# Patient Record
Sex: Male | Born: 1955 | Race: White | Hispanic: No | Marital: Single | Smoking: Former smoker
Health system: Southern US, Community
[De-identification: ages and names within clinical notes are randomized; demographics above are authoritative.]

## PROBLEM LIST (undated history)

## (undated) DIAGNOSIS — M2550 Pain in unspecified joint: Secondary | ICD-10-CM

## (undated) DIAGNOSIS — E785 Hyperlipidemia, unspecified: Secondary | ICD-10-CM

## (undated) DIAGNOSIS — E039 Hypothyroidism, unspecified: Secondary | ICD-10-CM

## (undated) DIAGNOSIS — T7840XA Allergy, unspecified, initial encounter: Secondary | ICD-10-CM

## (undated) DIAGNOSIS — R0602 Shortness of breath: Secondary | ICD-10-CM

## (undated) DIAGNOSIS — I4891 Unspecified atrial fibrillation: Secondary | ICD-10-CM

## (undated) DIAGNOSIS — E119 Type 2 diabetes mellitus without complications: Secondary | ICD-10-CM

## (undated) DIAGNOSIS — I10 Essential (primary) hypertension: Secondary | ICD-10-CM

## (undated) HISTORY — DX: Hypothyroidism, unspecified: E03.9

## (undated) HISTORY — DX: Pain in unspecified joint: M25.50

## (undated) HISTORY — DX: Shortness of breath: R06.02

## (undated) HISTORY — DX: Allergy, unspecified, initial encounter: T78.40XA

## (undated) HISTORY — DX: Hyperlipidemia, unspecified: E78.5

---

## 2008-11-02 ENCOUNTER — Emergency Department (HOSPITAL_COMMUNITY): Admission: EM | Admit: 2008-11-02 | Discharge: 2008-11-02 | Payer: Self-pay | Admitting: Family Medicine

## 2013-02-23 ENCOUNTER — Ambulatory Visit (INDEPENDENT_AMBULATORY_CARE_PROVIDER_SITE_OTHER): Payer: Self-pay | Admitting: Physician Assistant

## 2013-02-23 VITALS — BP 148/92 | HR 83 | Temp 98.3°F | Resp 17 | Ht 73.0 in | Wt 356.0 lb

## 2013-02-23 DIAGNOSIS — E119 Type 2 diabetes mellitus without complications: Secondary | ICD-10-CM

## 2013-02-23 DIAGNOSIS — R7309 Other abnormal glucose: Secondary | ICD-10-CM

## 2013-02-23 DIAGNOSIS — J019 Acute sinusitis, unspecified: Secondary | ICD-10-CM

## 2013-02-23 DIAGNOSIS — R7302 Impaired glucose tolerance (oral): Secondary | ICD-10-CM

## 2013-02-23 LAB — POCT CBC
Granulocyte percent: 80.6 %G — AB (ref 37–80)
MCH, POC: 29.7 pg (ref 27–31.2)
MCHC: 32.7 g/dL (ref 31.8–35.4)
MPV: 8.5 fL (ref 0–99.8)
POC Granulocyte: 9.3 — AB (ref 2–6.9)
POC LYMPH PERCENT: 14.3 %L (ref 10–50)
Platelet Count, POC: 243 10*3/uL (ref 142–424)
RBC: 5.19 M/uL (ref 4.69–6.13)

## 2013-02-23 MED ORDER — LISINOPRIL 5 MG PO TABS: 5.0000 mg | ORAL_TABLET | Freq: Every day | ORAL | Status: DC

## 2013-02-23 MED ORDER — METFORMIN HCL 500 MG PO TABS: 500.0000 mg | ORAL_TABLET | Freq: Two times a day (BID) | ORAL | Status: DC

## 2013-02-23 MED ORDER — AZITHROMYCIN 250 MG PO TABS: ORAL_TABLET | ORAL | Status: DC

## 2013-02-23 MED ORDER — IPRATROPIUM BROMIDE 0.06 % NA SOLN: 2.0000 | Freq: Three times a day (TID) | NASAL | Status: DC

## 2013-02-23 NOTE — Progress Notes (Signed)
Patient ID: Darius Ingram MRN: [id number], DOB: [birth year], 57 y.o. Date of Encounter: 02/23/2013, 1:05 PM  Primary Physician: No primary provider on file.  Chief Complaint:  Chief Complaint  Patient presents with  . Sinus Problem    sinus drainage and congestion started yesterday     HPI: 57 y.o. male presents with a 1 day history of nasal congestion, post nasal drip, sore throat, sinus pressure, and mild cough. Afebrile. No chills. Nasal congestion thick and green/yellow. Sinus pressure is the worst symptom along the maxillary sinuses. Cough is productive secondary to post nasal drip and not associated with time of day. No SOB or wheezing. Ears feel full, leading to sensation of muffled hearing. Has tried OTC cold preps without success. No GI complaints. Appetite normal. Typical presentation for patient. No chest pain or chest tightness.   No recent antibiotics, recent travels, or sick contacts.   No leg trauma, sedentary periods, h/o cancer, or tobacco use.  Past Medical History  Diagnosis Date  . Allergy      Home Meds: Prior to Admission medications   Medication Sig Start Date End Date Taking? Authorizing Provider  Pseudoephedrine-Acetaminophen (SINUS PO) Take by mouth.   Yes Historical Provider, MD    Allergies:  Allergies  Allergen Reactions  . Penicillins Rash    History   Social History  . Marital Status: Single    Spouse Name: N/A    Number of Children: N/A  . Years of Education: N/A   Occupational History  . Not on file.   Social History Main Topics  . Smoking status: Never Smoker   . Smokeless tobacco: Not on file  . Alcohol Use: Yes  . Drug Use: Not on file  . Sexually Active: Not on file   Other Topics Concern  . Not on file   Social History Narrative  . No narrative on file     Review of Systems: Constitutional: Positive for fatigue. Negative for chills, fever, night sweats or weight changes HEENT: see above Cardiovascular:  negative for chest pain or palpitations Respiratory: Positive for cough. Negative for hemoptysis, wheezing, or shortness of breath Abdominal: negative for abdominal pain, nausea, vomiting or diarrhea Dermatological: negative for rash Neurologic: Positive for headache. Negative for dizziness or vertigo   Physical Exam: Blood pressure 148/92, pulse 83, temperature 98.3 F (36.8 C), temperature source Oral, resp. rate 17, height 6\' 1"  (1.854 m), weight 356 lb (161.481 kg), SpO2 93.00%., Body mass index is 46.98 kg/(m^2). Pulse ox rechecks at 98% on room air.  General: Well developed, well nourished, in no acute distress. Head: Normocephalic, atraumatic, eyes without discharge, sclera non-icteric, nares are congested. Bilateral auditory canals clear, TM's are without perforation, pearly grey with reflective cone of light bilaterally. Serous effusion bilaterally behind TM's. Palpation of the maxillary sinus relieves pressure. Oral cavity moist, dentition normal. Posterior pharynx with post nasal drip and mild erythema. No peritonsillar abscess or tonsillar exudate. Uvula midline.  Neck: Supple. No thyromegaly. Full ROM. No lymphadenopathy. Lungs: Clear bilaterally to auscultation without wheezes, rales, or rhonchi. Breathing is unlabored.  Heart: RRR with S1 S2. No murmurs, rubs, or gallops appreciated. Msk:  Strength and tone normal for age. Extremities: No clubbing or cyanosis. No edema. Neuro: Alert and oriented X 3. Moves all extremities spontaneously. CNII-XII grossly in tact. Psych:  Responds to questions appropriately with a normal affect.   Labs: Results for orders placed in visit on 02/23/13  POCT CBC      Result  Value Range   WBC 11.5 (*) 4.6 - 10.2 K/uL   Lymph, poc 1.6  0.6 - 3.4   POC LYMPH PERCENT 14.3  10 - 50 %L   MID (cbc) 0.6  0 - 0.9   POC MID % 5.1  0 - 12 %M   POC Granulocyte 9.3 (*) 2 - 6.9   Granulocyte percent 80.6 (*) 37 - 80 %G   RBC 5.19  4.69 - 6.13 M/uL    Hemoglobin 15.4  14.1 - 18.1 g/dL   HCT, POC 16.1  09.6 - 53.7 %   MCV 90.8  80 - 97 fL   MCH, POC 29.7  27 - 31.2 pg   MCHC 32.7  31.8 - 35.4 g/dL   RDW, POC 04.5     Platelet Count, POC 243  142 - 424 K/uL   MPV 8.5  0 - 99.8 fL  GLUCOSE, POCT (MANUAL RESULT ENTRY)      Result Value Range   POC Glucose 128 (*) 70 - 99 mg/dl  POCT GLYCOSYLATED HEMOGLOBIN (HGB A1C)      Result Value Range   Hemoglobin A1C 6.6       ASSESSMENT AND PLAN:  57 y.o. male with sinusitis and newly diagnosed diabetes mellitus 1) Sinusitis -Azithromycin 250 MG #6 2 po first day then 1 po next 4 days no RF -Prednisone 20 mg 2 po daily for 5 days #10 no RF, SED -Mucinex -Tylenol/Motrin prn -Rest/fluids -RTC precautions -RTC 3-5 days if no improvement  2) Diabetes mellitus -Metformin 500 mg 1 po bid #60 RF 2 -Lisinopril 5 mg 1 po daily #30 RF 2 -ASA 81 mg  -Plan to start statin at follow up  -Healthy diet and exercise   Signed, Eula Listen, PA-C 02/23/2013 1:05 PM

## 2013-02-24 ENCOUNTER — Telehealth: Payer: Self-pay

## 2013-02-24 NOTE — Telephone Encounter (Signed)
PATIENT STATES HE WAS IN YESTERDAY (02/23/13) TO SEE RYAN DUNN FOR A SINUS INFECTION. HE NEEDS TO GET A NOTE FOR HIS JOB. HE NEEDS IT TO SAY WHY HE WAS HERE AND THE DATES ARE TO BE OUT FROM WED. 02/23/13 UNTIL SUN. 02/27/13. HE WILL RETURN TO WORK ON MON. 02/28/2013. PLEASE CALL HIM WHEN IT IS READY TO BE PICKED UP. BEST PHONE [phone].  MBC

## 2013-02-25 NOTE — Telephone Encounter (Signed)
Note provided, Darius Ingram

## 2013-05-16 ENCOUNTER — Encounter: Payer: Self-pay | Admitting: Physician Assistant

## 2013-05-16 ENCOUNTER — Ambulatory Visit: Payer: Self-pay | Admitting: Physician Assistant

## 2013-05-16 VITALS — BP 140/90 | HR 75 | Temp 98.7°F | Resp 20 | Ht 72.0 in | Wt 362.2 lb

## 2013-05-16 DIAGNOSIS — J019 Acute sinusitis, unspecified: Secondary | ICD-10-CM

## 2013-05-16 DIAGNOSIS — E119 Type 2 diabetes mellitus without complications: Secondary | ICD-10-CM

## 2013-05-16 DIAGNOSIS — R0981 Nasal congestion: Secondary | ICD-10-CM

## 2013-05-16 DIAGNOSIS — J3489 Other specified disorders of nose and nasal sinuses: Secondary | ICD-10-CM

## 2013-05-16 LAB — COMPREHENSIVE METABOLIC PANEL
ALT: 24 U/L (ref 0–53)
AST: 21 U/L (ref 0–37)
Albumin: 4.1 g/dL (ref 3.5–5.2)
Alkaline Phosphatase: 83 U/L (ref 39–117)
BUN: 16 mg/dL (ref 6–23)
Calcium: 9.2 mg/dL (ref 8.4–10.5)
Chloride: 108 mEq/L (ref 96–112)
Glucose, Bld: 111 mg/dL — ABNORMAL HIGH (ref 70–99)
Potassium: 4 mEq/L (ref 3.5–5.3)
Sodium: 145 mEq/L (ref 135–145)
Total Bilirubin: 0.4 mg/dL (ref 0.3–1.2)
Total Protein: 7 g/dL (ref 6.0–8.3)

## 2013-05-16 LAB — LIPID PANEL
LDL Cholesterol: 126 mg/dL — ABNORMAL HIGH (ref 0–99)
Total CHOL/HDL Ratio: 4.1 Ratio
Triglycerides: 73 mg/dL (ref <150)
VLDL: 15 mg/dL (ref 0–40)

## 2013-05-16 MED ORDER — METFORMIN HCL 1000 MG PO TABS: 1000.0000 mg | ORAL_TABLET | Freq: Two times a day (BID) | ORAL | Status: DC

## 2013-05-16 MED ORDER — LISINOPRIL 5 MG PO TABS: 5.0000 mg | ORAL_TABLET | Freq: Every day | ORAL | Status: DC

## 2013-05-16 MED ORDER — IPRATROPIUM BROMIDE 0.06 % NA SOLN: 2.0000 | Freq: Three times a day (TID) | NASAL | Status: DC

## 2013-05-16 NOTE — Progress Notes (Signed)
Patient ID: Darius Ingram MRN: [id number], DOB: 1955-11-11, 57 y.o. Date of Encounter: 05/16/2013, 2:21 PM  Primary Physician: No primary provider on file.  Chief Complaint: Diabetes follow up  HPI: 57 y.o. male with history below presents for follow up of diabetes mellitus. Doing well. No issues or complaints. Currently taking metformin 500 mg bid, lisinopril 5 mg daily, and ASA 81 mg daily. He is hesitant to start more medications such as a statin at this time. He is slowly working on his diet, but chocolate ice cream is his weakness. He will sometimes have days where he cannot get full. Drinks about 4-5 twelve oz beers per week. No polydipsia, polyphagia, polyuria, or nocturia.   Long term nasal congestion. Atrovent nasal spray helps. Requests a refill. Changes of the season seem to flare this up for him.   Last A1C: 6.6 on 02/24/13   Past Medical History  Diagnosis Date  . Allergy      Home Meds: Prior to Admission medications   Medication Sig Start Date End Date Taking? Authorizing Provider  lisinopril (PRINIVIL,ZESTRIL) 5 MG tablet Take 1 tablet (5 mg total) by mouth daily. 02/23/13  Yes Ahlia Lemanski M Simisola Sandles, PA-C  metFORMIN (GLUCOPHAGE) 500 MG tablet Take 1 tablet (500 mg total) by mouth 2 (two) times daily with a meal. 02/23/13  Yes Sondra Barges, PA-C           Allergies:  Allergies  Allergen Reactions  . Penicillins Rash    History   Social History  . Marital Status: Single    Spouse Name: N/A    Number of Children: N/A  . Years of Education: N/A   Occupational History  . Not on file.   Social History Main Topics  . Smoking status: Never Smoker   . Smokeless tobacco: Not on file  . Alcohol Use: Yes  . Drug Use: Not on file  . Sexual Activity: Not on file   Other Topics Concern  . Not on file   Social History Narrative  . No narrative on file     Review of Systems: Constitutional: negative for chills, fever, night sweats, weight changes, or fatigue    HEENT: positive for nasal congestion. negative for vision changes, hearing loss, rhinorrhea, or epistaxis Cardiovascular: negative for chest pain, palpitations, diaphoresis, DOE, orthopnea, or edema Respiratory: negative for wheezing, shortness of breath, dyspnea, or cough Abdominal: negative for abdominal pain, nausea, vomiting, diarrhea, or constipation Dermatological: negative for rash, erythema, or wounds Neurologic: negative for headache   Physical Exam: Blood pressure 140/90, pulse 75, temperature 98.7 F (37.1 C), temperature source Oral, resp. rate 20, height 6' (1.829 m), weight 362 lb 3.2 oz (164.293 kg), SpO2 97.00%., Body mass index is 49.11 kg/(m^2). General: Well developed, well nourished, in no acute distress. Head: Normocephalic, atraumatic, eyes without discharge, sclera non-icteric, nares are congested. Bilateral auditory canals clear, TM's are without perforation, pearly grey and translucent with reflective cone of light bilaterally. Oral cavity moist, posterior pharynx without exudate, erythema, peritonsillar abscess, or post nasal drip.  Neck: Supple. No thyromegaly. Full ROM. No lymphadenopathy. Lungs: Clear bilaterally to auscultation without wheezes, rales, or rhonchi. Breathing is unlabored. Heart: RRR with S1 S2. No murmurs, rubs, or gallops appreciated. Msk:  Strength and tone normal for age. Extremities/Skin: Warm and dry. No clubbing or cyanosis. No edema. No rashes, wounds, or suspicious lesions. Monofilament exam unremarkable bilaterally.  Neuro: Alert and oriented X 3. Moves all extremities spontaneously. Gait is normal. CNII-XII grossly in  tact. Psych:  Responds to questions appropriately with a normal affect.   Labs: Results for orders placed in visit on 05/16/13  GLUCOSE, POCT (MANUAL RESULT ENTRY)      Result Value Range   POC Glucose 116 (*) 70 - 99 mg/dl  POCT GLYCOSYLATED HEMOGLOBIN (HGB A1C)      Result Value Range   Hemoglobin A1C 6.9      CMP  and lipid pending.   ASSESSMENT AND PLAN:  57 y.o. male with diabetes mellitus and nasal congestion.  1) Diabetes mellitus  -Increase metformin to 1000 mg bid #60 RF 5 -Lisinopril 5 mg 1 po daily #30 RF 5 -ASA 81 mg 1 po daily -Will consider adding statin based on cholesterol -Healthy diet and start walking routine -Cut out some of the ice cream -Recheck 3 months -Patient to get his influenza vaccine at a pharmacy secondary to no insurance -Declines PNA vaccine -Will have to slowly introduce CPE, DDS, and eye MD to patient. He has even mentioned introducing new ideas slow so not to scare him away.  2) Nasal congestion -Atrovent NS 0.06% 2 sprays each nare bid prn #1 RF 5 -Declines ENT referral   Signed, Eula Listen, PA-C [facility] [place], [place] [address] [phone] 05/16/2013 2:21 PM

## 2013-05-26 ENCOUNTER — Encounter (HOSPITAL_COMMUNITY): Payer: Self-pay | Admitting: Emergency Medicine

## 2013-05-26 ENCOUNTER — Emergency Department (HOSPITAL_COMMUNITY)
Admission: EM | Admit: 2013-05-26 | Discharge: 2013-05-26 | Disposition: A | Discharge status: Home / Self Care | Payer: No Typology Code available for payment source | Attending: Emergency Medicine | Admitting: Emergency Medicine

## 2013-05-26 DIAGNOSIS — Z79899 Other long term (current) drug therapy: Secondary | ICD-10-CM | POA: Insufficient documentation

## 2013-05-26 DIAGNOSIS — E119 Type 2 diabetes mellitus without complications: Secondary | ICD-10-CM | POA: Insufficient documentation

## 2013-05-26 DIAGNOSIS — Z880 Allergy status to penicillin: Secondary | ICD-10-CM | POA: Insufficient documentation

## 2013-05-26 DIAGNOSIS — S0990XA Unspecified injury of head, initial encounter: Secondary | ICD-10-CM | POA: Insufficient documentation

## 2013-05-26 DIAGNOSIS — F419 Anxiety disorder, unspecified: Secondary | ICD-10-CM

## 2013-05-26 DIAGNOSIS — I10 Essential (primary) hypertension: Secondary | ICD-10-CM | POA: Insufficient documentation

## 2013-05-26 DIAGNOSIS — Y9389 Activity, other specified: Secondary | ICD-10-CM | POA: Insufficient documentation

## 2013-05-26 DIAGNOSIS — F411 Generalized anxiety disorder: Secondary | ICD-10-CM | POA: Insufficient documentation

## 2013-05-26 DIAGNOSIS — Y92410 Unspecified street and highway as the place of occurrence of the external cause: Secondary | ICD-10-CM | POA: Insufficient documentation

## 2013-05-26 HISTORY — DX: Essential (primary) hypertension: I10

## 2013-05-26 HISTORY — DX: Type 2 diabetes mellitus without complications: E11.9

## 2013-05-26 MED ORDER — CYCLOBENZAPRINE HCL 10 MG PO TABS: 10.0000 mg | ORAL_TABLET | Freq: Two times a day (BID) | ORAL | Status: DC | PRN

## 2013-05-26 MED ORDER — IBUPROFEN 800 MG PO TABS: 800.0000 mg | ORAL_TABLET | Freq: Three times a day (TID) | ORAL | Status: DC

## 2013-05-26 MED ORDER — HYDROCODONE-ACETAMINOPHEN 5-325 MG PO TABS: 1.0000 | ORAL_TABLET | ORAL | Status: DC | PRN

## 2013-05-26 NOTE — ED Notes (Signed)
PA at bedside.
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
## 2013-05-28 NOTE — ED Provider Notes (Signed)
Medical screening examination/treatment/procedure(s) were performed by non-physician practitioner and as supervising physician I was immediately available for consultation/collaboration.  EKG Interpretation   None         Nori Poland T Lisanne Ponce, MD 05/28/13 0950 

## 2013-08-22 ENCOUNTER — Ambulatory Visit: Payer: Self-pay | Admitting: Physician Assistant

## 2017-04-28 ENCOUNTER — Ambulatory Visit (INDEPENDENT_AMBULATORY_CARE_PROVIDER_SITE_OTHER): Payer: PRIVATE HEALTH INSURANCE | Admitting: Urgent Care

## 2017-04-28 ENCOUNTER — Encounter: Payer: Self-pay | Admitting: Urgent Care

## 2017-04-28 VITALS — BP 200/100 | HR 94 | Temp 98.3°F | Resp 18 | Ht 72.0 in | Wt 354.2 lb

## 2017-04-28 DIAGNOSIS — R030 Elevated blood-pressure reading, without diagnosis of hypertension: Secondary | ICD-10-CM

## 2017-04-28 DIAGNOSIS — M25561 Pain in right knee: Secondary | ICD-10-CM

## 2017-04-28 DIAGNOSIS — I10 Essential (primary) hypertension: Secondary | ICD-10-CM

## 2017-04-28 DIAGNOSIS — E1165 Type 2 diabetes mellitus with hyperglycemia: Secondary | ICD-10-CM | POA: Diagnosis not present

## 2017-04-28 DIAGNOSIS — M222X1 Patellofemoral disorders, right knee: Secondary | ICD-10-CM | POA: Diagnosis not present

## 2017-04-28 LAB — POCT GLYCOSYLATED HEMOGLOBIN (HGB A1C): Hemoglobin A1C: 6.9

## 2017-04-28 LAB — EKG 12-LEAD

## 2017-04-28 MED ORDER — ASPIRIN 81 MG PO TABS
81.0000 mg | ORAL_TABLET | Freq: Every day | ORAL | 3 refills | Status: DC
Start: 2017-04-28 — End: 2018-04-16

## 2017-04-28 MED ORDER — METFORMIN HCL 500 MG PO TABS: 500.0000 mg | ORAL_TABLET | Freq: Two times a day (BID) | ORAL | 1 refills | Status: DC

## 2017-04-28 MED ORDER — LISINOPRIL-HYDROCHLOROTHIAZIDE 10-12.5 MG PO TABS: 1.0000 | ORAL_TABLET | Freq: Every day | ORAL | 1 refills | Status: DC

## 2017-04-28 NOTE — Patient Instructions (Addendum)
You may take 500mg  Tylenol with every 6 hours for pain and inflammation.    Patellofemoral Pain Syndrome Patellofemoral pain syndrome is a condition that involves a softening or breakdown of the tissue (cartilage) on the underside of your kneecap (patella). This causes pain in the front of the knee. The condition is also called runner's knee or chondromalacia patella. Patellofemoral pain syndrome is most common in young adults who are active in sports. Your knee is the largest joint in your body. The patella covers the front of your knee and is attached to muscles above and below your knee. The underside of the patella is covered with a smooth type of cartilage (synovium). The smooth surface helps the patella glide easily when you move your knee. Patellofemoral pain syndrome causes swelling in the joint linings and bone surfaces in your knee. What are the causes? Patellofemoral pain syndrome can be caused by:  Overuse.  Poor alignment of your knee joints.  Weak leg muscles.  A direct blow to your kneecap.  What increases the risk? You may be at risk for patellofemoral pain syndrome if you:  Do a lot of activities that can wear down your kneecap. These include: ? Running. ? Squatting. ? Climbing stairs.  Start a new physical activity or exercise program.  Wear shoes that do not fit well.  Do not have good leg strength.  Are overweight.  What are the signs or symptoms? Knee pain is the most common symptom of patellofemoral pain syndrome. This may feel like a dull, aching pain underneath your patella, in the front of your knee. There may be a popping or cracking sound when you move your knee. Pain may get worse with:  Exercise.  Climbing stairs.  Running.  Jumping.  Squatting.  Kneeling.  Sitting for a long time.  Moving or pushing on your patella.  How is this diagnosed? Your health care provider may be able to diagnose patellofemoral pain syndrome from your  symptoms and medical history. You may be asked about your recent physical activities and which ones cause knee pain. Your health care provider may do a physical exam with certain tests to confirm the diagnosis. These may include:  Moving your patella back and forth.  Checking your range of knee motion.  Having you squat or jump to see if you have pain.  Checking the strength of your leg muscles.  An MRI of the knee may also be done. How is this treated? Patellofemoral pain syndrome can usually be treated at home with rest, ice, compression, and elevation (RICE). Other treatments may include:  Nonsteroidal anti-inflammatory drugs (NSAIDs).  Physical therapy to stretch and strengthen your leg muscles.  Shoe inserts (orthotics) to take stress off your knee.  A knee brace or knee support.  Surgery to remove damaged cartilage or move the patella to a better position. The need for surgery is rare.  Follow these instructions at home:  Take medicines only as directed by your health care provider.  Rest your knee. ? When resting, keep your knee raised above the level of your heart. ? Avoid activities that cause knee pain.  Apply ice to the injured area: ? Put ice in a plastic bag. ? Place a towel between your skin and the bag. ? Leave the ice on for 20 minutes, 2-3 times a day.  Use splints, braces, knee supports, or walking aids as directed by your health care provider.  Perform stretching and strengthening exercises as directed by your health  care provider or physical therapist.  Keep all follow-up visits as directed by your health care provider. This is important. Contact a health care provider if:  Your symptoms get worse.  You are not improving with home care. This information is not intended to replace advice given to you by your health care provider. Make sure you discuss any questions you have with your health care provider. Document Released: 07/02/2009 Document  Revised: 12/20/2015 Document Reviewed: 10/03/2013 Elsevier Interactive Patient Education  2018 [employer].    Knee Pain, Adult Many things can cause knee pain. The pain often goes away on its own with time and rest. If the pain does not go away, tests may be done to find out what is causing the pain. Follow these instructions at home: Activity  Rest your knee.  Do not do things that cause pain.  Avoid activities where both feet leave the ground at the same time (high-impact activities). Examples are running, jumping rope, and doing jumping jacks. General instructions  Take medicines only as told by your doctor.  Raise (elevate) your knee when you are resting. Make sure your knee is higher than your heart.  Sleep with a pillow under your knee.  If told, put ice on the knee: ? Put ice in a plastic bag. ? Place a towel between your skin and the bag. ? Leave the ice on for 20 minutes, 2-3 times a day.  Ask your doctor if you should wear an elastic knee support.  Lose weight if you are overweight. Being overweight can make your knee hurt more.  Do not use any tobacco products. These include cigarettes, chewing tobacco, or electronic cigarettes. If you need help quitting, ask your doctor. Smoking may slow down healing. Contact a doctor if:  The pain does not stop.  The pain changes or gets worse.  You have a fever along with knee pain.  Your knee gives out or locks up.  Your knee swells, and becomes worse. Get help right away if:  Your knee feels warm.  You cannot move your knee.  You have very bad knee pain.  You have chest pain.  You have trouble breathing. Summary  Many things can cause knee pain. The pain often goes away on its own with time and rest.  Avoid activities that put stress on your knee. These include running and jumping rope.  Get help right away if you cannot move your knee, or if your knee feels warm, or if you have trouble breathing. This  information is not intended to replace advice given to you by your health care provider. Make sure you discuss any questions you have with your health care provider. Document Released: 10/10/2008 Document Revised: 07/08/2016 Document Reviewed: 07/08/2016 Elsevier Interactive Patient Education  2017 [employer].     IF you received an x-ray today, you will receive an invoice from [facility]. Please contact [facility] at [phone] with questions or concerns regarding your invoice.   IF you received labwork today, you will receive an invoice from [facility]. Please contact [facility] at [phone] with questions or concerns regarding your invoice.   Our billing staff will not be able to assist you with questions regarding bills from these companies.  You will be contacted with the lab results as soon as they are available. The fastest way to get your results is to activate your My Chart account. Instructions are located on the last page of this paperwork. If you have not heard from  [place] regarding the results in 2 weeks, please contact this office.

## 2017-04-28 NOTE — Progress Notes (Signed)
  MRN: [id number] DOB: [birth year]  Subjective:   Darius Ingram is a 61 y.o. male presenting for chief complaint of Knee Pain (has history of right knee pain)  DM - Reports that he ran out of insurance and could not afford his medications any longer. Patient denies blurred vision, polydipsia, chest pain, nausea, vomiting, abdominal pain, hematuria, polyuria, skin infections, numbness or tingling.   HTN - Same as above for not using medications. Denies ROS as above including dizziness, confusion, heart racing, dyspnea, orthopnea, lower leg swelling. Denies smoking cigarettes.  Knee pain - Reports 1 week history of intermittent achy knee pain. He walks to [employer] daily and gets groceries from the same area. Denies redness, swelling, bruising, trauma, falls.  Darius Ingram currently has no medications in their medication list. Also is allergic to penicillins.  Darius Ingram  has a past medical history of Allergy; Diabetes mellitus without complication ([place]); and Hypertension. Also  has no past surgical history on file.  Objective:   Vitals: BP (!) 200/100   Pulse 94   Temp 98.3 F (36.8 C) (Oral)   Resp 18   Ht 6' (1.829 m)   Wt (!) 354 lb 3.2 oz (160.7 kg)   SpO2 97%   BMI 48.04 kg/m   Pulse was 72 on recheck by PA-Everlene Cunning.  Physical Exam  Constitutional: He is oriented to person, place, and time. He appears well-developed and well-nourished.  Cardiovascular: Normal rate, regular rhythm and intact distal pulses.  Exam reveals no gallop and no friction rub.   No murmur heard. Pulmonary/Chest: No respiratory distress. He has no wheezes. He has no rales.  Abdominal: Soft. Bowel sounds are normal. He exhibits no distension and no mass. There is no tenderness. There is no guarding.  Neurological: He is alert and oriented to person, place, and time.   Results for orders placed or performed in visit on 04/28/17 (from the past 24 hour(s))  POCT glycosylated hemoglobin (Hb A1C)     Status: None   Collection Time: 04/28/17 10:36 AM  Result Value Ref Range   Hemoglobin A1C 6.9    ECG interpretation - sinus tachycardia, no previous ecg for comparison.  Assessment and Plan :   1. Elevated blood pressure reading 2. Essential hypertension - Patient is asymptomatic, tachycardia not noted on physical exam. Start lis-HCTZ. ER and return-to-clinic precautions discussed, patient verbalized understanding. Follow up in 1 week for recheck.  3. Uncontrolled type 2 diabetes mellitus with hyperglycemia (HCC) - Restart metformin. Lifestyle modifications. Labs pending.  4. Right knee pain, unspecified chronicity 5. Patellofemoral pain syndrome of right knee - Will start conservative management with APAP, rest, modification of activities.  6. Morbid obesity ([place]) - Lipid panel pending.  Jaynee Eagles, PA-C Primary Care at [facility] [phone] 04/28/2017  10:07 AM

## 2017-04-29 LAB — COMPREHENSIVE METABOLIC PANEL
ALK PHOS: 80 IU/L (ref 39–117)
ALT: 17 IU/L (ref 0–44)
AST: 23 IU/L (ref 0–40)
Albumin/Globulin Ratio: 1.2 (ref 1.2–2.2)
Albumin: 3.7 g/dL (ref 3.6–4.8)
BUN/Creatinine Ratio: 17 (ref 10–24)
BUN: 17 mg/dL (ref 8–27)
Bilirubin Total: 0.3 mg/dL (ref 0.0–1.2)
CO2: 30 mmol/L — AB (ref 20–29)
CREATININE: 1.03 mg/dL (ref 0.76–1.27)
Calcium: 8.7 mg/dL (ref 8.6–10.2)
Chloride: 104 mmol/L (ref 96–106)
GFR calc non Af Amer: 77 mL/min/{1.73_m2} (ref >59)
GFR, EST AFRICAN AMERICAN: 90 mL/min/{1.73_m2} (ref >59)
GLOBULIN, TOTAL: 3.2 g/dL (ref 1.5–4.5)
GLUCOSE: 243 mg/dL — AB (ref 65–99)
POTASSIUM: 3.9 mmol/L (ref 3.5–5.2)
SODIUM: 143 mmol/L (ref 134–144)
TOTAL PROTEIN: 6.9 g/dL (ref 6.0–8.5)

## 2017-04-29 LAB — LIPID PANEL
CHOLESTEROL TOTAL: 158 mg/dL (ref 100–199)
Chol/HDL Ratio: 4.3 ratio (ref 0.0–5.0)
HDL: 37 mg/dL — ABNORMAL LOW (ref >39)
LDL CALC: 104 mg/dL — AB (ref 0–99)
TRIGLYCERIDES: 87 mg/dL (ref 0–149)
VLDL CHOLESTEROL CAL: 17 mg/dL (ref 5–40)

## 2017-04-29 LAB — BRAIN NATRIURETIC PEPTIDE: BNP: 154.3 pg/mL — ABNORMAL HIGH (ref 0.0–100.0)

## 2017-05-05 ENCOUNTER — Encounter: Payer: Self-pay | Admitting: Urgent Care

## 2017-05-05 ENCOUNTER — Ambulatory Visit (INDEPENDENT_AMBULATORY_CARE_PROVIDER_SITE_OTHER): Payer: PRIVATE HEALTH INSURANCE

## 2017-05-05 ENCOUNTER — Ambulatory Visit (INDEPENDENT_AMBULATORY_CARE_PROVIDER_SITE_OTHER): Payer: PRIVATE HEALTH INSURANCE | Admitting: Urgent Care

## 2017-05-05 ENCOUNTER — Telehealth: Payer: Self-pay | Admitting: Urgent Care

## 2017-05-05 VITALS — BP 142/94 | HR 86 | Temp 98.8°F | Resp 17 | Ht 72.0 in | Wt 353.0 lb

## 2017-05-05 DIAGNOSIS — I10 Essential (primary) hypertension: Secondary | ICD-10-CM

## 2017-05-05 DIAGNOSIS — R030 Elevated blood-pressure reading, without diagnosis of hypertension: Secondary | ICD-10-CM

## 2017-05-05 DIAGNOSIS — M25561 Pain in right knee: Secondary | ICD-10-CM | POA: Diagnosis not present

## 2017-05-05 DIAGNOSIS — M222X1 Patellofemoral disorders, right knee: Secondary | ICD-10-CM | POA: Diagnosis not present

## 2017-05-05 DIAGNOSIS — R7989 Other specified abnormal findings of blood chemistry: Secondary | ICD-10-CM

## 2017-05-05 MED ORDER — TRAMADOL-ACETAMINOPHEN 37.5-325 MG PO TABS: 1.0000 | ORAL_TABLET | Freq: Four times a day (QID) | ORAL | 0 refills | Status: DC | PRN

## 2017-05-05 MED ORDER — ATORVASTATIN CALCIUM 40 MG PO TABS: 40.0000 mg | ORAL_TABLET | Freq: Every day | ORAL | 3 refills | Status: DC

## 2017-05-05 NOTE — Patient Instructions (Addendum)
Knee Pain, Adult Knee pain in adults is common. It can be caused by many things, including:  Arthritis.  A fluid-filled sac (cyst) or growth in your knee.  An infection in your knee.  An injury that will not heal.  Damage, swelling, or irritation of the tissues that support your knee.  Knee pain is usually not a sign of a serious problem. The pain may go away on its own with time and rest. If it does not, a health care provider may order tests to find the cause of the pain. These may include:  Imaging tests, such as an X-ray, MRI, or ultrasound.  Joint aspiration. In this test, fluid is removed from the knee.  Arthroscopy. In this test, a lighted tube is inserted into knee and an image is projected onto a TV screen.  A biopsy. In this test, a sample of tissue is removed from the body and studied under a microscope.  Follow these instructions at home: Pay attention to any changes in your symptoms. Take these actions to relieve your pain. Activity  Rest your knee.  Do not do things that cause pain or make pain worse.  Avoid high-impact activities or exercises, such as running, jumping rope, or doing jumping jacks. General instructions  Take over-the-counter and prescription medicines only as told by your health care provider.  Raise (elevate) your knee above the level of your heart when you are sitting or lying down.  Sleep with a pillow under your knee.  If directed, apply ice to the knee: ? Put ice in a plastic bag. ? Place a towel between your skin and the bag. ? Leave the ice on for 20 minutes, 2-3 times a day.  Ask your health care provider if you should wear an elastic knee support.  Lose weight if you are overweight. Extra weight can put pressure on your knee.  Do not use any products that contain nicotine or tobacco, such as cigarettes and e-cigarettes. Smoking may slow the healing of any bone and joint problems that you may have. If you need help quitting, ask  your health care provider. Contact a health care provider if:  Your knee pain continues, changes, or gets worse.  You have a fever along with knee pain.  Your knee buckles or locks up.  Your knee swells, and the swelling becomes worse. Get help right away if:  Your knee feels warm to the touch.  You cannot move your knee.  You have severe pain in your knee.  You have chest pain.  You have trouble breathing. Summary  Knee pain in adults is common. It can be caused by many things, including, arthritis, infection, cysts, or injury.  Knee pain is usually not a sign of a serious problem, but if it does not go away, a health care provider may perform tests to know the cause of the pain.  Pay attention to any changes in your symptoms. Relieve your pain with rest, medicines, light activity, and use of ice.  Get help if your pain continues or becomes very severe, or if your knee buckles or locks up, or if you have chest pain or trouble breathing. This information is not intended to replace advice given to you by your health care provider. Make sure you discuss any questions you have with your health care provider. Document Released: 05/11/2007 Document Revised: 07/04/2016 Document Reviewed: 07/04/2016 Elsevier Interactive Patient Education  2018 [employer].     Hypertension Hypertension, commonly called high  blood pressure, is when the force of blood pumping through the arteries is too strong. The arteries are the blood vessels that carry blood from the heart throughout the body. Hypertension forces the heart to work harder to pump blood and may cause arteries to become narrow or stiff. Having untreated or uncontrolled hypertension can cause heart attacks, strokes, kidney disease, and other problems. A blood pressure reading consists of a higher number over a lower number. Ideally, your blood pressure should be below 120/80. The first ("top") number is called the systolic pressure.  It is a measure of the pressure in your arteries as your heart beats. The second ("bottom") number is called the diastolic pressure. It is a measure of the pressure in your arteries as the heart relaxes. What are the causes? The cause of this condition is not known. What increases the risk? Some risk factors for high blood pressure are under your control. Others are not. Factors you can change  Smoking.  Having type 2 diabetes mellitus, high cholesterol, or both.  Not getting enough exercise or physical activity.  Being overweight.  Having too much fat, sugar, calories, or salt (sodium) in your diet.  Drinking too much alcohol. Factors that are difficult or impossible to change  Having chronic kidney disease.  Having a family history of high blood pressure.  Age. Risk increases with age.  Race. You may be at higher risk if you are African-American.  Gender. Men are at higher risk than women before age 1. After age 35, women are at higher risk than men.  Having obstructive sleep apnea.  Stress. What are the signs or symptoms? Extremely high blood pressure (hypertensive crisis) may cause:  Headache.  Anxiety.  Shortness of breath.  Nosebleed.  Nausea and vomiting.  Severe chest pain.  Jerky movements you cannot control (seizures).  How is this diagnosed? This condition is diagnosed by measuring your blood pressure while you are seated, with your arm resting on a surface. The cuff of the blood pressure monitor will be placed directly against the skin of your upper arm at the level of your heart. It should be measured at least twice using the same arm. Certain conditions can cause a difference in blood pressure between your right and left arms. Certain factors can cause blood pressure readings to be lower or higher than normal (elevated) for a short period of time:  When your blood pressure is higher when you are in a health care provider's office than when you are  at home, this is called white coat hypertension. Most people with this condition do not need medicines.  When your blood pressure is higher at home than when you are in a health care provider's office, this is called masked hypertension. Most people with this condition may need medicines to control blood pressure.  If you have a high blood pressure reading during one visit or you have normal blood pressure with other risk factors:  You may be asked to return on a different day to have your blood pressure checked again.  You may be asked to monitor your blood pressure at home for 1 week or longer.  If you are diagnosed with hypertension, you may have other blood or imaging tests to help your health care provider understand your overall risk for other conditions. How is this treated? This condition is treated by making healthy lifestyle changes, such as eating healthy foods, exercising more, and reducing your alcohol intake. Your health care provider may  prescribe medicine if lifestyle changes are not enough to get your blood pressure under control, and if:  Your systolic blood pressure is above 130.  Your diastolic blood pressure is above 80.  Your personal target blood pressure may vary depending on your medical conditions, your age, and other factors. Follow these instructions at home: Eating and drinking  Eat a diet that is high in fiber and potassium, and low in sodium, added sugar, and fat. An example eating plan is called the DASH (Dietary Approaches to Stop Hypertension) diet. To eat this way: ? Eat plenty of fresh fruits and vegetables. Try to fill half of your plate at each meal with fruits and vegetables. ? Eat whole grains, such as whole wheat pasta, brown rice, or whole grain bread. Fill about one quarter of your plate with whole grains. ? Eat or drink low-fat dairy products, such as skim milk or low-fat yogurt. ? Avoid fatty cuts of meat, processed or cured meats, and poultry  with skin. Fill about one quarter of your plate with lean proteins, such as fish, chicken without skin, beans, eggs, and tofu. ? Avoid premade and processed foods. These tend to be higher in sodium, added sugar, and fat.  Reduce your daily sodium intake. Most people with hypertension should eat less than 1,500 mg of sodium a day.  Limit alcohol intake to no more than 1 drink a day for nonpregnant women and 2 drinks a day for men. One drink equals 12 oz of beer, 5 oz of wine, or 1 oz of hard liquor. Lifestyle  Work with your health care provider to maintain a healthy body weight or to lose weight. Ask what an ideal weight is for you.  Get at least 30 minutes of exercise that causes your heart to beat faster (aerobic exercise) most days of the week. Activities may include walking, swimming, or biking.  Include exercise to strengthen your muscles (resistance exercise), such as pilates or lifting weights, as part of your weekly exercise routine. Try to do these types of exercises for 30 minutes at least 3 days a week.  Do not use any products that contain nicotine or tobacco, such as cigarettes and e-cigarettes. If you need help quitting, ask your health care provider.  Monitor your blood pressure at home as told by your health care provider.  Keep all follow-up visits as told by your health care provider. This is important. Medicines  Take over-the-counter and prescription medicines only as told by your health care provider. Follow directions carefully. Blood pressure medicines must be taken as prescribed.  Do not skip doses of blood pressure medicine. Doing this puts you at risk for problems and can make the medicine less effective.  Ask your health care provider about side effects or reactions to medicines that you should watch for. Contact a health care provider if:  You think you are having a reaction to a medicine you are taking.  You have headaches that keep coming back  (recurring).  You feel dizzy.  You have swelling in your ankles.  You have trouble with your vision. Get help right away if:  You develop a severe headache or confusion.  You have unusual weakness or numbness.  You feel faint.  You have severe pain in your chest or abdomen.  You vomit repeatedly.  You have trouble breathing. Summary  Hypertension is when the force of blood pumping through your arteries is too strong. If this condition is not controlled, it may put  you at risk for serious complications.  Your personal target blood pressure may vary depending on your medical conditions, your age, and other factors. For most people, a normal blood pressure is less than 120/80.  Hypertension is treated with lifestyle changes, medicines, or a combination of both. Lifestyle changes include weight loss, eating a healthy, low-sodium diet, exercising more, and limiting alcohol. This information is not intended to replace advice given to you by your health care provider. Make sure you discuss any questions you have with your health care provider. Document Released: 07/14/2005 Document Revised: 06/11/2016 Document Reviewed: 06/11/2016 Elsevier Interactive Patient Education  2018 [employer].     IF you received an x-ray today, you will receive an invoice from [facility]. Please contact [facility] at [phone] with questions or concerns regarding your invoice.   IF you received labwork today, you will receive an invoice from [facility]. Please contact [facility] at [phone] with questions or concerns regarding your invoice.   Our billing staff will not be able to assist you with questions regarding bills from these companies.  You will be contacted with the lab results as soon as they are available. The fastest way to get your results is to activate your My Chart account. Instructions are located on the last page of this paperwork. If you have not heard from [place]  regarding the results in 2 weeks, please contact this office.

## 2017-05-05 NOTE — Telephone Encounter (Signed)
Pt called stated that the medication atorvastatin (LIPITOR) 40 MG tablet [[id number]] is too expensive for him to get right now if he could another medication cheaper..  Please advise

## 2017-05-05 NOTE — Progress Notes (Signed)
   MRN: [id number] DOB: Dec 22, 1955  Subjective:   Darius Ingram is a 61 y.o. male presenting for follow up on Hypertension.   Currently managed with lis-HTCZ. Patient is not checking blood pressure at home. Las OV was 04/28/2017 and was restarted on BP medications. Denies dizziness, chronic headache, blurred vision, chest pain, shortness of breath, heart racing, palpitations, nausea, vomiting, abdominal pain, hematuria, lower leg swelling. Denies smoking cigarettes. Regarding knee pain, there is mild improvement. He has been resting off of it, not taking the hill back up to his house from [employer]. Denies swelling, redness, warmth, trauma. Has been using APAP consistently.   Darius Ingram has a current medication list which includes the following prescription(s): aspirin, lisinopril-hydrochlorothiazide, and metformin. Also is allergic to penicillins.  [place]  has a past medical history of Allergy; Diabetes mellitus without complication ([place]); and Hypertension. Also  has no past surgical history on file.  Objective:   Vitals: BP (!) 142/94   Pulse 86   Temp 98.8 F (37.1 C) (Oral)   Resp 17   Ht 6' (1.829 m)   Wt (!) 353 lb (160.1 kg)   SpO2 98%   BMI 47.88 kg/m   BP Readings from Last 3 Encounters:  05/05/17 (!) 142/94  04/28/17 (!) 200/100  05/26/13 (!) 185/110    Physical Exam  Constitutional: He is oriented to person, place, and time. He appears well-developed and well-nourished.  HENT:  Mouth/Throat: Oropharynx is clear and moist.  Cardiovascular: Normal rate, regular rhythm and intact distal pulses.  Exam reveals no gallop and no friction rub.   No murmur heard. Pulmonary/Chest: No respiratory distress. He has no wheezes. He has no rales.  Musculoskeletal:       Right knee: He exhibits normal range of motion, no swelling, no effusion, no ecchymosis, no deformity, no laceration, no erythema, normal alignment, normal patellar mobility and no bony tenderness. No tenderness  found.  Neurological: He is alert and oriented to person, place, and time.  Skin: Skin is warm and dry.   Dg Knee Complete 4 Views Right  Result Date: 05/05/2017 CLINICAL DATA:  Acute onset of right knee pain. History of diabetes. No report of injury. EXAM: RIGHT KNEE - COMPLETE 4+ VIEW COMPARISON:  None in PACs FINDINGS: The bones are subjectively adequately mineralized. There is mild narrowing of the medial joint space. No lytic or blastic bony lesion is observed. There is a tiny spur arising from the inferior articular margin of the patella. The patellofemoral joint space is reasonably well-maintained. IMPRESSION: There is mild narrowing of the medial joint space most compatible with early osteoarthritic change. Minimal patellar spurring. There is no acute bony abnormality. Electronically Signed   By: David  Martinique M.D.   On: 05/05/2017 10:07    Assessment and Plan :   1. Acute pain of right knee 2. Patellofemoral pain syndrome of right knee - Will continue conservative management given his improvement. Schedule APAP, use Ultracet for breakthrough pain. Follow up.   3. Morbid obesity ([place]) 4. Essential hypertension 5. Elevated blood pressure reading 6. Elevated brain natriuretic peptide (BNP) level - Dramatically improved. Labs pending. Return-to-clinic precautions discussed, patient verbalized understanding. Double dose of lis-HCTZ. Add atorvastatin 40mg .   Jaynee Eagles, PA-C [facility] at [facility] [phone] 05/05/2017  9:44 AM

## 2017-05-06 LAB — MICROALBUMIN / CREATININE URINE RATIO
CREATININE, UR: 72.4 mg/dL
MICROALBUM., U, RANDOM: 28.5 ug/mL
Microalb/Creat Ratio: 39.4 mg/g creat — ABNORMAL HIGH (ref 0.0–30.0)

## 2017-05-06 LAB — BRAIN NATRIURETIC PEPTIDE: BNP: 117.7 pg/mL — ABNORMAL HIGH (ref 0.0–100.0)

## 2017-05-12 NOTE — Telephone Encounter (Signed)
Please advise 

## 2017-05-13 ENCOUNTER — Other Ambulatory Visit: Payer: Self-pay

## 2017-05-13 MED ORDER — ATORVASTATIN CALCIUM 40 MG PO TABS: 40.0000 mg | ORAL_TABLET | Freq: Every day | ORAL | 3 refills | Status: DC

## 2017-05-13 NOTE — Progress Notes (Unsigned)
IC pt - advised [facility] has $9 per 30 day supply or $24 for 90 day supply.  Pt agreed to have it called to [place].  Resent.

## 2017-05-13 NOTE — Telephone Encounter (Signed)
Should be on $4 list at [facility]. Please send there. Thank you.

## 2017-06-04 ENCOUNTER — Ambulatory Visit (INDEPENDENT_AMBULATORY_CARE_PROVIDER_SITE_OTHER): Payer: PRIVATE HEALTH INSURANCE | Admitting: Urgent Care

## 2017-06-04 ENCOUNTER — Encounter: Payer: Self-pay | Admitting: Urgent Care

## 2017-06-04 ENCOUNTER — Other Ambulatory Visit: Payer: Self-pay

## 2017-06-04 VITALS — BP 164/88 | HR 98 | Temp 99.1°F | Resp 18 | Ht 72.0 in | Wt 354.0 lb

## 2017-06-04 DIAGNOSIS — R030 Elevated blood-pressure reading, without diagnosis of hypertension: Secondary | ICD-10-CM

## 2017-06-04 DIAGNOSIS — I10 Essential (primary) hypertension: Secondary | ICD-10-CM | POA: Diagnosis not present

## 2017-06-04 DIAGNOSIS — M222X1 Patellofemoral disorders, right knee: Secondary | ICD-10-CM

## 2017-06-04 MED ORDER — AMLODIPINE BESYLATE 10 MG PO TABS: 10.0000 mg | ORAL_TABLET | Freq: Every day | ORAL | 3 refills | Status: DC

## 2017-06-04 MED ORDER — ATORVASTATIN CALCIUM 40 MG PO TABS
40.0000 mg | ORAL_TABLET | Freq: Every day | ORAL | 3 refills | Status: DC
Start: 2017-06-04 — End: 2018-01-21

## 2017-06-04 MED ORDER — LISINOPRIL-HYDROCHLOROTHIAZIDE 20-25 MG PO TABS: 1.0000 | ORAL_TABLET | Freq: Every day | ORAL | 3 refills | Status: DC

## 2017-06-04 NOTE — Patient Instructions (Addendum)
Amlodipine - For the first 2 weeks, take 1/2 tablet daily. After that, take 1 full tablet daily.  Check your blood pressure 2 times per week, pick the same days and try to take your blood pressure measurements around the same time each day. Record your readings, give me a call in 1 month so we can go over them and decide what we'll do next.     Managing Your Hypertension Hypertension is commonly called high blood pressure. This is when the force of your blood pressing against the walls of your arteries is too strong. Arteries are blood vessels that carry blood from your heart throughout your body. Hypertension forces the heart to work harder to pump blood, and may cause the arteries to become narrow or stiff. Having untreated or uncontrolled hypertension can cause heart attack, stroke, kidney disease, and other problems. What are blood pressure readings? A blood pressure reading consists of a higher number over a lower number. Ideally, your blood pressure should be below 120/80. The first ("top") number is called the systolic pressure. It is a measure of the pressure in your arteries as your heart beats. The second ("bottom") number is called the diastolic pressure. It is a measure of the pressure in your arteries as the heart relaxes. What does my blood pressure reading mean? Blood pressure is classified into four stages. Based on your blood pressure reading, your health care provider may use the following stages to determine what type of treatment you need, if any. Systolic pressure and diastolic pressure are measured in a unit called mm Hg. Normal  Systolic pressure: below 098.  Diastolic pressure: below 80. Elevated  Systolic pressure: 119-147.  Diastolic pressure: below 80. Hypertension stage 1  Systolic pressure: 829-562.  Diastolic pressure: 13-08. Hypertension stage 2  Systolic pressure: 657 or above.  Diastolic pressure: 90 or above. What health risks are associated with  hypertension? Managing your hypertension is an important responsibility. Uncontrolled hypertension can lead to:  A heart attack.  A stroke.  A weakened blood vessel (aneurysm).  Heart failure.  Kidney damage.  Eye damage.  Metabolic syndrome.  Memory and concentration problems.  What changes can I make to manage my hypertension? Hypertension can be managed by making lifestyle changes and possibly by taking medicines. Your health care provider will help you make a plan to bring your blood pressure within a normal range. Eating and drinking  Eat a diet that is high in fiber and potassium, and low in salt (sodium), added sugar, and fat. An example eating plan is called the DASH (Dietary Approaches to Stop Hypertension) diet. To eat this way: ? Eat plenty of fresh fruits and vegetables. Try to fill half of your plate at each meal with fruits and vegetables. ? Eat whole grains, such as whole wheat pasta, brown rice, or whole grain bread. Fill about one quarter of your plate with whole grains. ? Eat low-fat diary products. ? Avoid fatty cuts of meat, processed or cured meats, and poultry with skin. Fill about one quarter of your plate with lean proteins such as fish, chicken without skin, beans, eggs, and tofu. ? Avoid premade and processed foods. These tend to be higher in sodium, added sugar, and fat.  Reduce your daily sodium intake. Most people with hypertension should eat less than 1,500 mg of sodium a day.  Limit alcohol intake to no more than 1 drink a day for nonpregnant women and 2 drinks a day for men. One drink equals 12  oz of beer, 5 oz of wine, or 1 oz of hard liquor. Lifestyle  Work with your health care provider to maintain a healthy body weight, or to lose weight. Ask what an ideal weight is for you.  Get at least 30 minutes of exercise that causes your heart to beat faster (aerobic exercise) most days of the week. Activities may include walking, swimming, or  biking.  Include exercise to strengthen your muscles (resistance exercise), such as weight lifting, as part of your weekly exercise routine. Try to do these types of exercises for 30 minutes at least 3 days a week.  Do not use any products that contain nicotine or tobacco, such as cigarettes and e-cigarettes. If you need help quitting, ask your health care provider.  Control any long-term (chronic) conditions you have, such as high cholesterol or diabetes. Monitoring  Monitor your blood pressure at home as told by your health care provider. Your personal target blood pressure may vary depending on your medical conditions, your age, and other factors.  Have your blood pressure checked regularly, as often as told by your health care provider. Working with your health care provider  Review all the medicines you take with your health care provider because there may be side effects or interactions.  Talk with your health care provider about your diet, exercise habits, and other lifestyle factors that may be contributing to hypertension.  Visit your health care provider regularly. Your health care provider can help you create and adjust your plan for managing hypertension. Will I need medicine to control my blood pressure? Your health care provider may prescribe medicine if lifestyle changes are not enough to get your blood pressure under control, and if:  Your systolic blood pressure is 130 or higher.  Your diastolic blood pressure is 80 or higher.  Take medicines only as told by your health care provider. Follow the directions carefully. Blood pressure medicines must be taken as prescribed. The medicine does not work as well when you skip doses. Skipping doses also puts you at risk for problems. Contact a health care provider if:  You think you are having a reaction to medicines you have taken.  You have repeated (recurrent) headaches.  You feel dizzy.  You have swelling in your  ankles.  You have trouble with your vision. Get help right away if:  You develop a severe headache or confusion.  You have unusual weakness or numbness, or you feel faint.  You have severe pain in your chest or abdomen.  You vomit repeatedly.  You have trouble breathing. Summary  Hypertension is when the force of blood pumping through your arteries is too strong. If this condition is not controlled, it may put you at risk for serious complications.  Your personal target blood pressure may vary depending on your medical conditions, your age, and other factors. For most people, a normal blood pressure is less than 120/80.  Hypertension is managed by lifestyle changes, medicines, or both. Lifestyle changes include weight loss, eating a healthy, low-sodium diet, exercising more, and limiting alcohol. This information is not intended to replace advice given to you by your health care provider. Make sure you discuss any questions you have with your health care provider. Document Released: 04/07/2012 Document Revised: 06/11/2016 Document Reviewed: 06/11/2016 Elsevier Interactive Patient Education  2018 [employer].    IF you received an x-ray today, you will receive an invoice from [facility]. Please contact [facility] at [phone] with questions or concerns  concerns regarding your invoice.   IF you received labwork today, you will receive an invoice from [facility]. Please contact [facility] at [phone] with questions or concerns regarding your invoice.   Our billing staff will not be able to assist you with questions regarding bills from these companies.  You will be contacted with the lab results as soon as they are available. The fastest way to get your results is to activate your My Chart account. Instructions are located on the last page of this paperwork. If you have not heard from us regarding the results in 2 weeks, please contact this office.      

## 2017-06-04 NOTE — Progress Notes (Signed)
   MRN: [id number] DOB: [birth year]  Subjective:   Darius Ingram is a 61 y.o. male presenting for follow up on Hypertension.   Currently managed with lis-HCTZ. Patient is not checking blood pressure at home. Avoids salt in diet, is trying to make diet changes, is exercising by walking. Reports that his knee pain is a lot better. Denies dizziness, chronic headache, blurred vision, chest pain, shortness of breath, heart racing, palpitations, nausea, vomiting, abdominal pain, hematuria, lower leg swelling. Denies smoking cigarettes or drinking alcohol.   Darius Ingram has a current medication list which includes the following prescription(s): aspirin, atorvastatin, lisinopril-hydrochlorothiazide, metformin, and tramadol-acetaminophen. Also is allergic to penicillins.  Darius Ingram  has a past medical history of Allergy, Diabetes mellitus without complication ([place]), and Hypertension. Also  has no past surgical history on file.  Objective:   Vitals: BP (!) 164/88   Pulse 98   Temp 99.1 F (37.3 C) (Oral)   Resp 18   Ht 6' (1.829 m)   Wt (!) 354 lb (160.6 kg)   SpO2 96%   BMI 48.01 kg/m   BP Readings from Last 3 Encounters:  06/04/17 (!) 164/88  05/05/17 (!) 142/94  04/28/17 (!) 200/100   Physical Exam  Constitutional: He is oriented to person, place, and time. He appears well-developed and well-nourished.  Cardiovascular: Normal rate, regular rhythm and intact distal pulses. Exam reveals no gallop and no friction rub.  No murmur heard. Pulmonary/Chest: No respiratory distress. He has no wheezes. He has no rales.  Musculoskeletal: He exhibits edema (trace up to mid-calves bilaterally).  Neurological: He is alert and oriented to person, place, and time.  Skin: Skin is warm and dry.   Assessment and Plan :   1. Essential hypertension 2. Elevated blood pressure reading 3. Morbid obesity ([place]) - Maintain lis-HCTZ, add amlodipine. Continue efforts and healthy diet and weight loss.  Return-to-clinic precautions discussed, patient verbalized understanding. F/u by phone in 1 month, in office in 3 months.   4. Patellofemoral pain syndrome of right knee - Improved, f/u as needed.  Jaynee Eagles, PA-C [facility] [phone] 06/04/2017  9:50 AM

## 2017-07-08 ENCOUNTER — Telehealth: Payer: Self-pay | Admitting: Urgent Care

## 2017-07-08 NOTE — Telephone Encounter (Signed)
Pt   Advised  To record  bp   Twice  A   Day  For  One  Month  . Readings  Were  Taken  On  Same  Machine  Same  Arm  Was   Sitting   Down   The  Readings  Were taken  For the  Most  Part   At  7 am   And  330 pm   The  Readings  Were  Taken  l   Arm .       The  Reading  Are   As  Follows   13  Nov    146 /  74           14 nov    160/93    151/88       15  Nov     190/99     179/93      16  Nov   184/98   179/104         17  Nov  205/ 132      171/102      18 nov  164/84      185/103     19 nov   192/95      179/90     20  Nov   174 /94    146/75   21  Nov   219/101   158/94     22  Nov  193/94   170/89    23  Nov   184/103   163/83   24 nov  166/93  167/90    25  Nov 170/98  179/95  26  Nov  199/94   171/97   27  Nov  194/101   173/91   28 Nov 186/107  199/98  29 nov  203/108   174/80   30  Nov 199/103  187/114   1 Dec   176/91    157/90   2 Dec  172/90   165/79   3  Dec  196/90   159/84    4 Dec  184/93   156/93   5  Dec 211/103     230/130    6 Dec  164/87   165/90    7 Dec  165/88   186/96   8Dec  163/88   165/90   9  Dec 194/100    179/93  10  Dec 186/94  157/89

## 2017-07-08 NOTE — Telephone Encounter (Signed)
BP readings message sent to [facility]

## 2017-07-10 MED ORDER — LISINOPRIL 20 MG PO TABS: 20.0000 mg | ORAL_TABLET | Freq: Every day | ORAL | 3 refills | Status: DC

## 2017-07-10 NOTE — Addendum Note (Signed)
Addended by: Jaynee Eagles on: 07/10/2017 08:55 AM   Modules accepted: Orders

## 2017-07-10 NOTE — Telephone Encounter (Signed)
Pt notified and transferred to [facility] to schedule appt.

## 2017-07-10 NOTE — Telephone Encounter (Signed)
Please have patient take lisinopril 20mg  in addition to his lisinopril-HCTZ 20-25mg  and amlodipine. I will send a script to his pharmacy electronically.  Scheduling, please have patient come back for an office visit within a week. Thank you!

## 2017-07-22 ENCOUNTER — Ambulatory Visit (INDEPENDENT_AMBULATORY_CARE_PROVIDER_SITE_OTHER): Payer: PRIVATE HEALTH INSURANCE | Admitting: Urgent Care

## 2017-07-22 ENCOUNTER — Encounter: Payer: Self-pay | Admitting: Urgent Care

## 2017-07-22 VITALS — BP 132/82 | HR 88 | Temp 98.5°F | Resp 20 | Ht 72.0 in | Wt 362.6 lb

## 2017-07-22 DIAGNOSIS — E119 Type 2 diabetes mellitus without complications: Secondary | ICD-10-CM | POA: Diagnosis not present

## 2017-07-22 DIAGNOSIS — R5383 Other fatigue: Secondary | ICD-10-CM | POA: Diagnosis not present

## 2017-07-22 DIAGNOSIS — R7989 Other specified abnormal findings of blood chemistry: Secondary | ICD-10-CM | POA: Diagnosis not present

## 2017-07-22 DIAGNOSIS — I10 Essential (primary) hypertension: Secondary | ICD-10-CM

## 2017-07-22 NOTE — Patient Instructions (Signed)
Hypertension Hypertension is another name for high blood pressure. High blood pressure forces your heart to work harder to pump blood. This can cause problems over time. There are two numbers in a blood pressure reading. There is a top number (systolic) over a bottom number (diastolic). It is best to have a blood pressure below 120/80. Healthy choices can help lower your blood pressure. You may need medicine to help lower your blood pressure if:  Your blood pressure cannot be lowered with healthy choices.  Your blood pressure is higher than 130/80.  Follow these instructions at home: Eating and drinking  If directed, follow the DASH eating plan. This diet includes: ? Filling half of your plate at each meal with fruits and vegetables. ? Filling one quarter of your plate at each meal with whole grains. Whole grains include whole wheat pasta, brown rice, and whole grain bread. ? Eating or drinking low-fat dairy products, such as skim milk or low-fat yogurt. ? Filling one quarter of your plate at each meal with low-fat (lean) proteins. Low-fat proteins include fish, skinless chicken, eggs, beans, and tofu. ? Avoiding fatty meat, cured and processed meat, or chicken with skin. ? Avoiding premade or processed food.  Eat less than 1,500 mg of salt (sodium) a day.  Limit alcohol use to no more than 1 drink a day for nonpregnant women and 2 drinks a day for men. One drink equals 12 oz of beer, 5 oz of wine, or 1 oz of hard liquor. Lifestyle  Work with your doctor to stay at a healthy weight or to lose weight. Ask your doctor what the best weight is for you.  Get at least 30 minutes of exercise that causes your heart to beat faster (aerobic exercise) most days of the week. This may include walking, swimming, or biking.  Get at least 30 minutes of exercise that strengthens your muscles (resistance exercise) at least 3 days a week. This may include lifting weights or pilates.  Do not use any  products that contain nicotine or tobacco. This includes cigarettes and e-cigarettes. If you need help quitting, ask your doctor.  Check your blood pressure at home as told by your doctor.  Keep all follow-up visits as told by your doctor. This is important. Medicines  Take over-the-counter and prescription medicines only as told by your doctor. Follow directions carefully.  Do not skip doses of blood pressure medicine. The medicine does not work as well if you skip doses. Skipping doses also puts you at risk for problems.  Ask your doctor about side effects or reactions to medicines that you should watch for. Contact a doctor if:  You think you are having a reaction to the medicine you are taking.  You have headaches that keep coming back (recurring).  You feel dizzy.  You have swelling in your ankles.  You have trouble with your vision. Get help right away if:  You get a very bad headache.  You start to feel confused.  You feel weak or numb.  You feel faint.  You get very bad pain in your: ? Chest. ? Belly (abdomen).  You throw up (vomit) more than once.  You have trouble breathing. Summary  Hypertension is another name for high blood pressure.  Making healthy choices can help lower blood pressure. If your blood pressure cannot be controlled with healthy choices, you may need to take medicine. This information is not intended to replace advice given to you by your health care   provider. Make sure you discuss any questions you have with your health care provider. Document Released: 12/31/2007 Document Revised: 06/11/2016 Document Reviewed: 06/11/2016 Elsevier Interactive Patient Education  2018 [employer]     IF you received an x-ray today, you will receive an invoice from [facility]. Please contact  Radiology at [phone] with questions or concerns regarding your invoice.   IF you received labwork today, you will receive an invoice  from [facility]. Please contact [facility] at [phone] with questions or concerns regarding your invoice.   Our billing staff will not be able to assist you with questions regarding bills from these companies.  You will be contacted with the lab results as soon as they are available. The fastest way to get your results is to activate your My Chart account. Instructions are located on the last page of this paperwork. If you have not heard from us regarding the results in 2 weeks, please contact this office.      

## 2017-07-22 NOTE — Progress Notes (Signed)
   MRN: [id number] DOB: January 28, 1956  Subjective:   Darius Ingram is a 61 y.o. male presenting for follow up on Hypertension. Patient had lisinopril dose increased from 20mg  lisinopril to 40mg  on 07/08/2017 due to persistently elevated blood pressure readings at home. He was advised to maintain his amlodipine and lis-HCTZ. Avoids salt in diet, is walking and staying active for exercise. Admits intermittent fatigue after taking some of his medications. Denies dizziness, chronic headache, blurred vision, chest pain, shortness of breath, heart racing, palpitations, nausea, vomiting, abdominal pain, hematuria, lower leg swelling. Denies smoking cigarettes or drinking alcohol.   Darius Ingram has a current medication list which includes the following prescription(s): amlodipine, aspirin, atorvastatin, lisinopril, lisinopril-hydrochlorothiazide, and metformin. Also is allergic to penicillins.  Darius Ingram  has a past medical history of Allergy, Diabetes mellitus without complication ([place]), and Hypertension. Also  has no past surgical history on file.  Objective:   Vitals: BP 132/82   Pulse 88   Temp 98.5 F (36.9 C) (Oral)   Resp 20   Ht 6' (1.829 m)   Wt (!) 362 lb 9.6 oz (164.5 kg)   SpO2 96%   BMI 49.18 kg/m   BP Readings from Last 3 Encounters:  07/22/17 132/82  06/04/17 (!) 164/88  05/05/17 (!) 142/94    Physical Exam  Constitutional: He is oriented to person, place, and time. He appears well-developed and well-nourished.  HENT:  Mouth/Throat: Oropharynx is clear and moist.  Eyes: No scleral icterus.  Neck: Normal range of motion. Neck supple.  No carotid bruits.  Cardiovascular: Normal rate, regular rhythm and intact distal pulses. Exam reveals no gallop and no friction rub.  No murmur heard. Pulmonary/Chest: No respiratory distress. He has no wheezes. He has no rales.  Neurological: He is alert and oriented to person, place, and time.  Psychiatric: He has a normal mood and affect.    Assessment and Plan :   Essential hypertension - Plan: CANCELED: Comprehensive metabolic panel  Controlled type 2 diabetes mellitus without complication, without long-term current use of insulin (HCC) - Plan: CANCELED: Hemoglobin A1c  Morbid obesity (HCC)  Elevated brain natriuretic peptide (BNP) level - Plan: CANCELED: Brain natriuretic peptide  Other fatigue  Patient refused blood work today due to billing issues with his insurance. His BP is significantly improved. Maintain healthy lifestyle. Return-to-clinic precautions discussed, patient verbalized understanding.   Jaynee Eagles, PA-C [facility] [phone] 07/22/2017  9:53 AM

## 2017-09-10 ENCOUNTER — Ambulatory Visit: Payer: PRIVATE HEALTH INSURANCE | Admitting: Urgent Care

## 2017-10-18 ENCOUNTER — Other Ambulatory Visit: Payer: Self-pay | Admitting: Urgent Care

## 2018-01-21 ENCOUNTER — Other Ambulatory Visit: Payer: Self-pay

## 2018-01-21 ENCOUNTER — Telehealth: Payer: Self-pay

## 2018-01-21 ENCOUNTER — Ambulatory Visit (INDEPENDENT_AMBULATORY_CARE_PROVIDER_SITE_OTHER): Payer: Self-pay | Admitting: Urgent Care

## 2018-01-21 ENCOUNTER — Encounter: Payer: Self-pay | Admitting: Urgent Care

## 2018-01-21 VITALS — BP 120/83 | HR 109 | Temp 97.9°F | Resp 17 | Ht 72.0 in | Wt 348.0 lb

## 2018-01-21 DIAGNOSIS — I10 Essential (primary) hypertension: Secondary | ICD-10-CM

## 2018-01-21 DIAGNOSIS — J0100 Acute maxillary sinusitis, unspecified: Secondary | ICD-10-CM

## 2018-01-21 DIAGNOSIS — E119 Type 2 diabetes mellitus without complications: Secondary | ICD-10-CM

## 2018-01-21 DIAGNOSIS — J3089 Other allergic rhinitis: Secondary | ICD-10-CM

## 2018-01-21 LAB — POCT GLYCOSYLATED HEMOGLOBIN (HGB A1C): Hemoglobin A1C: 14 % — AB (ref 4.0–5.6)

## 2018-01-21 MED ORDER — FLUTICASONE PROPIONATE 50 MCG/ACT NA SUSP: 2.0000 | Freq: Every day | NASAL | 3 refills | Status: DC

## 2018-01-21 MED ORDER — GLIPIZIDE 10 MG PO TABS: 10.0000 mg | ORAL_TABLET | Freq: Two times a day (BID) | ORAL | 1 refills | Status: DC

## 2018-01-21 MED ORDER — LISINOPRIL-HYDROCHLOROTHIAZIDE 20-25 MG PO TABS: 1.0000 | ORAL_TABLET | Freq: Every day | ORAL | 3 refills | Status: DC

## 2018-01-21 MED ORDER — METFORMIN HCL 500 MG PO TABS: 500.0000 mg | ORAL_TABLET | Freq: Two times a day (BID) | ORAL | 1 refills | Status: DC

## 2018-01-21 MED ORDER — CEFDINIR 300 MG PO CAPS: 300.0000 mg | ORAL_CAPSULE | Freq: Two times a day (BID) | ORAL | 0 refills | Status: DC

## 2018-01-21 MED ORDER — CETIRIZINE HCL 10 MG PO TABS: 10.0000 mg | ORAL_TABLET | Freq: Every day | ORAL | 3 refills | Status: AC

## 2018-01-21 MED ORDER — LISINOPRIL 20 MG PO TABS: 20.0000 mg | ORAL_TABLET | Freq: Every day | ORAL | 3 refills | Status: DC

## 2018-01-21 MED ORDER — METFORMIN HCL 1000 MG PO TABS: 1000.0000 mg | ORAL_TABLET | Freq: Two times a day (BID) | ORAL | 1 refills | Status: DC

## 2018-01-21 MED ORDER — AMLODIPINE BESYLATE 10 MG PO TABS: 10.0000 mg | ORAL_TABLET | Freq: Every day | ORAL | 3 refills | Status: DC

## 2018-01-21 MED ORDER — ATORVASTATIN CALCIUM 40 MG PO TABS: 40.0000 mg | ORAL_TABLET | Freq: Every day | ORAL | 3 refills | Status: DC

## 2018-01-21 NOTE — Telephone Encounter (Signed)
Spoke with pharmacy ([facility] [address]) and canceled rx sent for metformin 500 mg one tab bid #180 with 1 refill.  Metformin 1000 mg one tab bid with a meal #180 with 1 refill. Dgaddy, CMA

## 2018-01-21 NOTE — Progress Notes (Signed)
MRN: [id number]  Subjective:   Darius Ingram is a 62 y.o. male who presents for follow up of Type 2 Diabetes Mellitus, HTN.  He is been under a lot of stress due to his living situation.  Reports that he is unsure what will happen to the home that he is renting as there was a recent family member that died on the home.  He continues to try and stay active, gets outdoors and walks.  He is trying to practice a healthy diet for both his blood pressure and his diabetes.  Currently he is taking 500 mg of metformin twice daily.  For his blood pressure he is taking lisinopril at 40 mg and hydrochlorothiazide 25 mg.  He is also taking amlodipine 10 mg.  For mixed hyperlipidemia patient is taking 40 mg.  He would like something to help with chronic sinus congestion, has had issues with this over the past month and a half.  He has been taking over-the-counter Aleve for sinuses.  He also reports that he is now having right-sided sinus pain, persistent runny nose, productive cough.  He has allergies to penicillin.  Denies fever, chest pain, belly pain, hematuria.  Denies smoking cigarettes.  Denies alcohol use.  Midas has a current medication list which includes the following prescription(s): amlodipine, aspirin, atorvastatin, lisinopril, lisinopril-hydrochlorothiazide, and metformin. Also is allergic to penicillins.  Weaver  has a past medical history of Allergy, Diabetes mellitus without complication ([place]), and Hypertension.  Denies past surgical history.  Immunizations: Patient is refusing vaccines today due to cost burden and not having insurance.  Objective:   PHYSICAL EXAM BP 120/83 (BP Location: Right Arm, Patient Position: Sitting, Cuff Size: Large)   Pulse (!) 109   Temp 97.9 F (36.6 C) (Oral)   Resp 17   Ht 6' (1.829 m)   Wt (!) 348 lb (157.9 kg)   SpO2 95%   BMI 47.20 kg/m   BP Readings from Last 3 Encounters:  01/21/18 120/83  07/22/17 132/82  06/04/17 (!) 164/88    Wt  Readings from Last 3 Encounters:  01/21/18 (!) 348 lb (157.9 kg)  07/22/17 (!) 362 lb 9.6 oz (164.5 kg)  06/04/17 (!) 354 lb (160.6 kg)    Physical Exam  Constitutional: He is oriented to person, place, and time. He appears well-developed and well-nourished.  HENT:  Nose: Mucosal edema and rhinorrhea present. Right sinus exhibits maxillary sinus tenderness. Left sinus exhibits no maxillary sinus tenderness.  Mouth/Throat: Oropharynx is clear and moist.  Eyes: Right eye exhibits no discharge. Left eye exhibits no discharge. No scleral icterus.  Cardiovascular: Normal rate, regular rhythm and intact distal pulses. Exam reveals no gallop and no friction rub.  No murmur heard. Pulmonary/Chest: Effort normal and breath sounds normal. No respiratory distress. He has no wheezes. He has no rales.  Musculoskeletal: He exhibits no edema.  Neurological: He is alert and oriented to person, place, and time.  Skin: Skin is warm and dry.  Psychiatric: He has a normal mood and affect.   Results for orders placed or performed in visit on 01/21/18 (from the past 24 hour(s))  POCT glycosylated hemoglobin (Hb A1C)     Status: Abnormal   Collection Time: 01/21/18 11:23 AM  Result Value Ref Range   Hemoglobin A1C 14.0 (A) 4.0 - 5.6 %   HbA1c POC (<> result, manual entry)  4.0 - 5.6 %   HbA1c, POC (prediabetic range)  5.7 - 6.4 %   HbA1c, POC (controlled  diabetic range)  0.0 - 7.0 %   Assessment and Plan :   Controlled type 2 diabetes mellitus without complication, without long-term current use of insulin ([place]) - Plan: POCT glycosylated hemoglobin (Hb A1C), CANCELED: HM DIABETES FOOT EXAM  Essential hypertension  Morbid obesity (HCC)  Non-seasonal allergic rhinitis due to other allergic trigger  Acute non-recurrent maxillary sinusitis  Patient is doing well with his chronic medical conditions.  I refilled all his medications and sent him to [facility] as these are much more affordable there.  I  increase his dose of metformin to 1,000mg  twice daily and will add glipizide to 10 mg twice daily as well.  I emphasized need for strict diabetic diet.  He cannot afford insulin but counseled that if his diabetes is not better in the next 3 months, that is what we will have to use.  Patient is in agreement.  For sinusitis, we will use Omnicef given his allergy to penicillin and all the time he spends outdoors in the sun we cannot use doxycycline.  Patient will start Flonase and Zyrtec as well for management of his allergies due to being outdoors.  He is to call me and let me know if his symptoms persist.  Otherwise follow-up in 3 months for his blood sugar, hypertension and hyperlipidemia.  Jaynee Eagles, PA-C [facility] [phone] 01/21/2018 11:02 AM

## 2018-01-21 NOTE — Patient Instructions (Addendum)
Please make sure that you are eating less or limit your carbohydrates such as rice, potatoes, breads, pasta, pizza, sodas, beer, sweet tea, fruit juices. Exercise can also help with your blood sugar. You can instead eat more salads, fruits, vegetables, whole grains.    Salads - kale, spinach, cabbage, spring mix; use seeds like pumpkin seeds or sunflower seeds; you can also use 1-2 hard boiled eggs. Fruits - avocadoes, berries (blueberries, raspberries, blackberries), apples, oranges, pomegranate, grapefruit Seeds - quinoa, chia seeds, flax seeds Vegetables - aspargus, cauliflower, broccoli, green beans, brussel spouts, bell peppers; stay away from starchy vegetables like potatoes, carrots, peas    Diabetes Mellitus and Nutrition When you have diabetes (diabetes mellitus), it is very important to have healthy eating habits because your blood sugar (glucose) levels are greatly affected by what you eat and drink. Eating healthy foods in the appropriate amounts, at about the same times every day, can help you:  Control your blood glucose.  Lower your risk of heart disease.  Improve your blood pressure.  Reach or maintain a healthy weight.  Every person with diabetes is different, and each person has different needs for a meal plan. Your health care provider may recommend that you work with a diet and nutrition specialist (dietitian) to make a meal plan that is best for you. Your meal plan may vary depending on factors such as:  The calories you need.  The medicines you take.  Your weight.  Your blood glucose, blood pressure, and cholesterol levels.  Your activity level.  Other health conditions you have, such as heart or kidney disease.  How do carbohydrates affect me? Carbohydrates affect your blood glucose level more than any other type of food. Eating carbohydrates naturally increases the amount of glucose in your blood. Carbohydrate counting is a method for keeping track of how  many carbohydrates you eat. Counting carbohydrates is important to keep your blood glucose at a healthy level, especially if you use insulin or take certain oral diabetes medicines. It is important to know how many carbohydrates you can safely have in each meal. This is different for every person. Your dietitian can help you calculate how many carbohydrates you should have at each meal and for snack. Foods that contain carbohydrates include:  Bread, cereal, rice, pasta, and crackers.  Potatoes and corn.  Peas, beans, and lentils.  Milk and yogurt.  Fruit and juice.  Desserts, such as cakes, cookies, ice cream, and candy.  How does alcohol affect me? Alcohol can cause a sudden decrease in blood glucose (hypoglycemia), especially if you use insulin or take certain oral diabetes medicines. Hypoglycemia can be a life-threatening condition. Symptoms of hypoglycemia (sleepiness, dizziness, and confusion) are similar to symptoms of having too much alcohol. If your health care provider says that alcohol is safe for you, follow these guidelines:  Limit alcohol intake to no more than 1 drink per day for nonpregnant women and 2 drinks per day for men. One drink equals 12 oz of beer, 5 oz of wine, or 1 oz of hard liquor.  Do not drink on an empty stomach.  Keep yourself hydrated with water, diet soda, or unsweetened iced tea.  Keep in mind that regular soda, juice, and other mixers may contain a lot of sugar and must be counted as carbohydrates.  What are tips for following this plan? Reading food labels  Start by checking the serving size on the label. The amount of calories, carbohydrates, fats, and other nutrients listed  on the label are based on one serving of the food. Many foods contain more than one serving per package.  Check the total grams (g) of carbohydrates in one serving. You can calculate the number of servings of carbohydrates in one serving by dividing the total carbohydrates  by 15. For example, if a food has 30 g of total carbohydrates, it would be equal to 2 servings of carbohydrates.  Check the number of grams (g) of saturated and trans fats in one serving. Choose foods that have low or no amount of these fats.  Check the number of milligrams (mg) of sodium in one serving. Most people should limit total sodium intake to less than 2,300 mg per day.  Always check the nutrition information of foods labeled as "low-fat" or "nonfat". These foods may be higher in added sugar or refined carbohydrates and should be avoided.  Talk to your dietitian to identify your daily goals for nutrients listed on the label. Shopping  Avoid buying canned, premade, or processed foods. These foods tend to be high in fat, sodium, and added sugar.  Shop around the outside edge of the grocery store. This includes fresh fruits and vegetables, bulk grains, fresh meats, and fresh dairy. Cooking  Use low-heat cooking methods, such as baking, instead of high-heat cooking methods like deep frying.  Cook using healthy oils, such as olive, canola, or sunflower oil.  Avoid cooking with butter, cream, or high-fat meats. Meal planning  Eat meals and snacks regularly, preferably at the same times every day. Avoid going long periods of time without eating.  Eat foods high in fiber, such as fresh fruits, vegetables, beans, and whole grains. Talk to your dietitian about how many servings of carbohydrates you can eat at each meal.  Eat 4-6 ounces of lean protein each day, such as lean meat, chicken, fish, eggs, or tofu. 1 ounce is equal to 1 ounce of meat, chicken, or fish, 1 egg, or 1/4 cup of tofu.  Eat some foods each day that contain healthy fats, such as avocado, nuts, seeds, and fish. Lifestyle   Check your blood glucose regularly.  Exercise at least 30 minutes 5 or more days each week, or as told by your health care provider.  Take medicines as told by your health care provider.  Do  not use any products that contain nicotine or tobacco, such as cigarettes and e-cigarettes. If you need help quitting, ask your health care provider.  Work with a Social worker or diabetes educator to identify strategies to manage stress and any emotional and social challenges. What are some questions to ask my health care provider?  Do I need to meet with a diabetes educator?  Do I need to meet with a dietitian?  What number can I call if I have questions?  When are the best times to check my blood glucose? Where to find more information:  American Diabetes Association: diabetes.org/food-and-fitness/food  Academy of Nutrition and Dietetics: PokerClues.dk  [employer] of Diabetes and Digestive and Kidney Diseases (NIH): ContactWire.be Summary  A healthy meal plan will help you control your blood glucose and maintain a healthy lifestyle.  Working with a diet and nutrition specialist (dietitian) can help you make a meal plan that is best for you.  Keep in mind that carbohydrates and alcohol have immediate effects on your blood glucose levels. It is important to count carbohydrates and to use alcohol carefully. This information is not intended to replace advice given to you by your  health care provider. Make sure you discuss any questions you have with your health care provider. Document Released: 04/10/2005 Document Revised: 08/18/2016 Document Reviewed: 08/18/2016 Elsevier Interactive Patient Education  2018 [employer].     IF you received an x-ray today, you will receive an invoice from [facility]. Please contact [facility] at [phone] with questions or concerns regarding your invoice.   IF you received labwork today, you will receive an invoice from [place]. Please contact [facility] at [phone] with questions or concerns  regarding your invoice.   Our billing staff will not be able to assist you with questions regarding bills from these companies.  You will be contacted with the lab results as soon as they are available. The fastest way to get your results is to activate your My Chart account. Instructions are located on the last page of this paperwork. If you have not heard from [place] regarding the results in 2 weeks, please contact this office.

## 2018-03-05 ENCOUNTER — Telehealth: Payer: Self-pay | Admitting: General Practice

## 2018-03-05 NOTE — Telephone Encounter (Signed)
Called pt to let them know we would need to reschedule their appt 04/23/18. Left VM.

## 2018-04-16 ENCOUNTER — Other Ambulatory Visit: Payer: Self-pay | Admitting: Urgent Care

## 2018-04-16 NOTE — Telephone Encounter (Signed)
Called [facility] and spoke to [facility] about refill request for metformin 500 mg since 1000mg  was ordered on 01/21/18 OV. He says they only have 500mg . Pt called to verify which pharmacy he uses [facility] or [facility] due to last order sent to [facility]. Left VM to return call to office.

## 2018-04-20 ENCOUNTER — Ambulatory Visit: Payer: Self-pay | Admitting: Urgent Care

## 2018-04-20 ENCOUNTER — Other Ambulatory Visit: Payer: Self-pay

## 2018-04-20 ENCOUNTER — Encounter: Payer: Self-pay | Admitting: Urgent Care

## 2018-04-20 VITALS — BP 139/85 | HR 118 | Temp 98.3°F | Resp 18 | Ht 72.0 in | Wt 358.0 lb

## 2018-04-20 DIAGNOSIS — R7989 Other specified abnormal findings of blood chemistry: Secondary | ICD-10-CM

## 2018-04-20 DIAGNOSIS — I10 Essential (primary) hypertension: Secondary | ICD-10-CM

## 2018-04-20 DIAGNOSIS — R0602 Shortness of breath: Secondary | ICD-10-CM

## 2018-04-20 DIAGNOSIS — E119 Type 2 diabetes mellitus without complications: Secondary | ICD-10-CM

## 2018-04-20 LAB — POCT GLYCOSYLATED HEMOGLOBIN (HGB A1C): Hemoglobin A1C: 7.4 % — AB (ref 4.0–5.6)

## 2018-04-20 MED ORDER — METFORMIN HCL 1000 MG PO TABS: 1000.0000 mg | ORAL_TABLET | Freq: Two times a day (BID) | ORAL | 1 refills | Status: DC

## 2018-04-20 NOTE — Patient Instructions (Addendum)
Diabetes Mellitus and Nutrition When you have diabetes (diabetes mellitus), it is very important to have healthy eating habits because your blood sugar (glucose) levels are greatly affected by what you eat and drink. Eating healthy foods in the appropriate amounts, at about the same times every day, can help you:  Control your blood glucose.  Lower your risk of heart disease.  Improve your blood pressure.  Reach or maintain a healthy weight.  Every person with diabetes is different, and each person has different needs for a meal plan. Your health care provider may recommend that you work with a diet and nutrition specialist (dietitian) to make a meal plan that is best for you. Your meal plan may vary depending on factors such as:  The calories you need.  The medicines you take.  Your weight.  Your blood glucose, blood pressure, and cholesterol levels.  Your activity level.  Other health conditions you have, such as heart or kidney disease.  How do carbohydrates affect me? Carbohydrates affect your blood glucose level more than any other type of food. Eating carbohydrates naturally increases the amount of glucose in your blood. Carbohydrate counting is a method for keeping track of how many carbohydrates you eat. Counting carbohydrates is important to keep your blood glucose at a healthy level, especially if you use insulin or take certain oral diabetes medicines. It is important to know how many carbohydrates you can safely have in each meal. This is different for every person. Your dietitian can help you calculate how many carbohydrates you should have at each meal and for snack. Foods that contain carbohydrates include:  Bread, cereal, rice, pasta, and crackers.  Potatoes and corn.  Peas, beans, and lentils.  Milk and yogurt.  Fruit and juice.  Desserts, such as cakes, cookies, ice cream, and candy.  How does alcohol affect me? Alcohol can cause a sudden decrease in blood  glucose (hypoglycemia), especially if you use insulin or take certain oral diabetes medicines. Hypoglycemia can be a life-threatening condition. Symptoms of hypoglycemia (sleepiness, dizziness, and confusion) are similar to symptoms of having too much alcohol. If your health care provider says that alcohol is safe for you, follow these guidelines:  Limit alcohol intake to no more than 1 drink per day for nonpregnant women and 2 drinks per day for men. One drink equals 12 oz of beer, 5 oz of wine, or 1 oz of hard liquor.  Do not drink on an empty stomach.  Keep yourself hydrated with water, diet soda, or unsweetened iced tea.  Keep in mind that regular soda, juice, and other mixers may contain a lot of sugar and must be counted as carbohydrates.  What are tips for following this plan? Reading food labels  Start by checking the serving size on the label. The amount of calories, carbohydrates, fats, and other nutrients listed on the label are based on one serving of the food. Many foods contain more than one serving per package.  Check the total grams (g) of carbohydrates in one serving. You can calculate the number of servings of carbohydrates in one serving by dividing the total carbohydrates by 15. For example, if a food has 30 g of total carbohydrates, it would be equal to 2 servings of carbohydrates.  Check the number of grams (g) of saturated and trans fats in one serving. Choose foods that have low or no amount of these fats.  Check the number of milligrams (mg) of sodium in one serving. Most people   should limit total sodium intake to less than 2,300 mg per day.  Always check the nutrition information of foods labeled as "low-fat" or "nonfat". These foods may be higher in added sugar or refined carbohydrates and should be avoided.  Talk to your dietitian to identify your daily goals for nutrients listed on the label. Shopping  Avoid buying canned, premade, or processed foods. These  foods tend to be high in fat, sodium, and added sugar.  Shop around the outside edge of the grocery store. This includes fresh fruits and vegetables, bulk grains, fresh meats, and fresh dairy. Cooking  Use low-heat cooking methods, such as baking, instead of high-heat cooking methods like deep frying.  Cook using healthy oils, such as olive, canola, or sunflower oil.  Avoid cooking with butter, cream, or high-fat meats. Meal planning  Eat meals and snacks regularly, preferably at the same times every day. Avoid going long periods of time without eating.  Eat foods high in fiber, such as fresh fruits, vegetables, beans, and whole grains. Talk to your dietitian about how many servings of carbohydrates you can eat at each meal.  Eat 4-6 ounces of lean protein each day, such as lean meat, chicken, fish, eggs, or tofu. 1 ounce is equal to 1 ounce of meat, chicken, or fish, 1 egg, or 1/4 cup of tofu.  Eat some foods each day that contain healthy fats, such as avocado, nuts, seeds, and fish. Lifestyle   Check your blood glucose regularly.  Exercise at least 30 minutes 5 or more days each week, or as told by your health care provider.  Take medicines as told by your health care provider.  Do not use any products that contain nicotine or tobacco, such as cigarettes and e-cigarettes. If you need help quitting, ask your health care provider.  Work with a counselor or diabetes educator to identify strategies to manage stress and any emotional and social challenges. What are some questions to ask my health care provider?  Do I need to meet with a diabetes educator?  Do I need to meet with a dietitian?  What number can I call if I have questions?  When are the best times to check my blood glucose? Where to find more information:  American Diabetes Association: diabetes.org/food-and-fitness/food  Academy of Nutrition and Dietetics:  www.eatright.org/resources/health/diseases-and-conditions/diabetes  National Institute of Diabetes and Digestive and Kidney Diseases (NIH): www.niddk.nih.gov/health-information/diabetes/overview/diet-eating-physical-activity Summary  A healthy meal plan will help you control your blood glucose and maintain a healthy lifestyle.  Working with a diet and nutrition specialist (dietitian) can help you make a meal plan that is best for you.  Keep in mind that carbohydrates and alcohol have immediate effects on your blood glucose levels. It is important to count carbohydrates and to use alcohol carefully. This information is not intended to replace advice given to you by your health care provider. Make sure you discuss any questions you have with your health care provider. Document Released: 04/10/2005 Document Revised: 08/18/2016 Document Reviewed: 08/18/2016 Elsevier Interactive Patient Education  2018 Elsevier Inc.     If you have lab work done today you will be contacted with your lab results within the next 2 weeks.  If you have not heard from us then please contact us. The fastest way to get your results is to register for My Chart.   IF you received an x-ray today, you will receive an invoice from [facility]. Please contact [facility] at [phone] with questions or concerns regarding your   invoice.   IF you received labwork today, you will receive an invoice from [facility]. Please contact [facility] at [phone] with questions or concerns regarding your invoice.   Our billing staff will not be able to assist you with questions regarding bills from these companies.  You will be contacted with the lab results as soon as they are available. The fastest way to get your results is to activate your My Chart account. Instructions are located on the last page of this paperwork. If you have not heard from us regarding the results in 2 weeks, please contact this office.      

## 2018-04-20 NOTE — Progress Notes (Signed)
   MRN: [id number]  Subjective:   Darius Ingram is a 62 y.o. male who presents for follow up of Type 2 Diabetes Mellitus.  At his last office visit patient was found to have an A1c of 14% on 01/21/2018.  He admitted significant dietary noncompliance.  He refused insulin at the time due to cost burden.  Today, he reports that he is made a lot of dietary modifications, monitor his carbohydrate intake very strictly.  He has been active and has tried to walk for exercise.  He does report episodes of waking up feeling short of breath, has been peeing more frequently and had more nocturia.  He is currently managing his diabetes with metformin 1000 mg and glipizide 10 mg both twice daily.  Denies chest pain, heart racing, palpitations, nausea, vomiting, belly pain, hematuria, skin infections.  Darius Ingram has a current medication list which includes the following prescription(s): amlodipine, aspirin, atorvastatin, cefdinir, cetirizine, fluticasone, glipizide, lisinopril, lisinopril-hydrochlorothiazide, and metformin. Also is allergic to penicillins.  Darius Ingram  has a past medical history of Allergy, Diabetes mellitus without complication ([place]), and Hypertension. Also  has no past surgical history on file.   Objective:   PHYSICAL EXAM BP 139/85   Pulse (!) 118   Temp 98.3 F (36.8 C) (Oral)   Resp 18   Ht 6' (1.829 m)   Wt (!) 358 lb (162.4 kg)   SpO2 94%   BMI 48.55 kg/m   Pulse was 88 on recheck by PA [place] Blatchley.  Physical Exam  Constitutional: He is oriented to person, place, and time. He appears well-developed and well-nourished.  HENT:  Mouth/Throat: Oropharynx is clear and moist.  Eyes: No scleral icterus.  Cardiovascular: Normal rate, regular rhythm, normal heart sounds and intact distal pulses. Exam reveals no gallop and no friction rub.  No murmur heard. Pulmonary/Chest: Effort normal and breath sounds normal. No stridor. No respiratory distress. He has no wheezes. He has no rales.    Neurological: He is alert and oriented to person, place, and time.   Results for orders placed or performed in visit on 04/20/18 (from the past 24 hour(s))  POCT glycosylated hemoglobin (Hb A1C)     Status: Abnormal   Collection Time: 04/20/18 10:40 AM  Result Value Ref Range   Hemoglobin A1C 7.4 (A) 4.0 - 5.6 %   HbA1c POC (<> result, manual entry)     HbA1c, POC (prediabetic range)     HbA1c, POC (controlled diabetic range)     Assessment and Plan :   Controlled type 2 diabetes mellitus without complication, without long-term current use of insulin ([place]) - Plan: POCT glycosylated hemoglobin (Hb V6X), Basic metabolic panel  Essential hypertension  Morbid obesity (HCC)  Elevated brain natriuretic peptide (BNP) level - Plan: Brain natriuretic peptide  Shortness of breath - Plan: Brain natriuretic peptide  Patient is doing so much better with his diabetes management.  I emphasized need to maintain a healthy diet and his level of activity.  He was agreeable to this.  I refilled his medications today.  He will follow-up in 3 to 6 months.  Jaynee Eagles, PA-C [facility] [phone] 04/20/2018 10:26 AM

## 2018-04-21 LAB — BASIC METABOLIC PANEL
BUN / CREAT RATIO: 26 — AB (ref 10–24)
BUN: 25 mg/dL (ref 8–27)
CO2: 24 mmol/L (ref 20–29)
Calcium: 8.7 mg/dL (ref 8.6–10.2)
Chloride: 99 mmol/L (ref 96–106)
Creatinine, Ser: 0.98 mg/dL (ref 0.76–1.27)
GFR calc non Af Amer: 82 mL/min/{1.73_m2} (ref >59)
GFR, EST AFRICAN AMERICAN: 94 mL/min/{1.73_m2} (ref >59)
Glucose: 225 mg/dL — ABNORMAL HIGH (ref 65–99)
Potassium: 3.3 mmol/L — ABNORMAL LOW (ref 3.5–5.2)
Sodium: 144 mmol/L (ref 134–144)

## 2018-04-21 LAB — BRAIN NATRIURETIC PEPTIDE: BNP: 61.8 pg/mL (ref 0.0–100.0)

## 2018-04-23 ENCOUNTER — Ambulatory Visit: Payer: Self-pay | Admitting: Urgent Care

## 2018-04-28 ENCOUNTER — Ambulatory Visit: Payer: Self-pay | Admitting: Urgent Care

## 2018-06-21 ENCOUNTER — Encounter: Payer: Self-pay | Admitting: Emergency Medicine

## 2018-06-21 ENCOUNTER — Other Ambulatory Visit: Payer: Self-pay

## 2018-06-21 ENCOUNTER — Ambulatory Visit: Payer: Self-pay | Admitting: Emergency Medicine

## 2018-06-21 VITALS — BP 130/82 | HR 89 | Temp 98.6°F | Resp 18 | Ht 72.5 in | Wt 356.2 lb

## 2018-06-21 DIAGNOSIS — J3089 Other allergic rhinitis: Secondary | ICD-10-CM

## 2018-06-21 DIAGNOSIS — I10 Essential (primary) hypertension: Secondary | ICD-10-CM

## 2018-06-21 DIAGNOSIS — E119 Type 2 diabetes mellitus without complications: Secondary | ICD-10-CM

## 2018-06-21 MED ORDER — ATORVASTATIN CALCIUM 40 MG PO TABS: 40.0000 mg | ORAL_TABLET | Freq: Every day | ORAL | 3 refills | Status: DC

## 2018-06-21 MED ORDER — FLUTICASONE PROPIONATE 50 MCG/ACT NA SUSP: 2.0000 | Freq: Every day | NASAL | 3 refills | Status: DC

## 2018-06-21 MED ORDER — AMLODIPINE BESYLATE 10 MG PO TABS: 10.0000 mg | ORAL_TABLET | Freq: Every day | ORAL | 3 refills | Status: DC

## 2018-06-21 MED ORDER — LISINOPRIL 20 MG PO TABS: 20.0000 mg | ORAL_TABLET | Freq: Every day | ORAL | 3 refills | Status: DC

## 2018-06-21 MED ORDER — METFORMIN HCL 1000 MG PO TABS: 1000.0000 mg | ORAL_TABLET | Freq: Two times a day (BID) | ORAL | 1 refills | Status: DC

## 2018-06-21 NOTE — Progress Notes (Signed)
Darius Ingram 62 y.o.   Chief Complaint  Patient presents with  . Establish Care  . Medication Refill    LIPITOR and FLONASE    HISTORY OF PRESENT ILLNESS: This is a 62 y.o. male with history of hypertension here to establish care with me today.  Also requesting medication refills.  Has no complaints or medical concerns today.  HPI   Prior to Admission medications   Medication Sig Start Date End Date Taking? Authorizing Provider  amLODipine (NORVASC) 10 MG tablet Take 1 tablet (10 mg total) by mouth daily. 01/21/18  Yes Jaynee Eagles, PA-C  aspirin 81 MG EC tablet TAKE 1 TABLET BY MOUTH EVERY DAY 04/16/18  Yes Jaynee Eagles, PA-C  atorvastatin (LIPITOR) 40 MG tablet Take 1 tablet (40 mg total) by mouth daily. 01/21/18  Yes Jaynee Eagles, PA-C  cetirizine (ZYRTEC) 10 MG tablet Take 1 tablet (10 mg total) by mouth daily. 01/21/18  Yes Jaynee Eagles, PA-C  fluticasone (FLONASE) 50 MCG/ACT nasal spray Place 2 sprays into both nostrils daily. 01/21/18  Yes Jaynee Eagles, PA-C  lisinopril (PRINIVIL,ZESTRIL) 20 MG tablet Take 1 tablet (20 mg total) by mouth daily. 01/21/18  Yes Jaynee Eagles, PA-C  lisinopril-hydrochlorothiazide (PRINZIDE,ZESTORETIC) 20-25 MG tablet Take 1 tablet by mouth daily. 01/21/18  Yes Jaynee Eagles, PA-C  metFORMIN (GLUCOPHAGE) 1000 MG tablet Take 1 tablet (1,000 mg total) by mouth 2 (two) times daily with a meal. 04/20/18  Yes Jaynee Eagles, PA-C    Allergies  Allergen Reactions  . Penicillins Rash    There are no active problems to display for this patient.   Past Medical History:  Diagnosis Date  . Allergy   . Diabetes mellitus without complication ([place])   . Hypertension     No past surgical history on file.  Social History   Socioeconomic History  . Marital status: Single    Spouse name: Not on file  . Number of children: Not on file  . Years of education: Not on file  . Highest education level: Not on file  Occupational History  . Not on file  Social Needs    . Financial resource strain: Not on file  . Food insecurity:    Worry: Not on file    Inability: Not on file  . Transportation needs:    Medical: Not on file    Non-medical: Not on file  Tobacco Use  . Smoking status: Never Smoker  . Smokeless tobacco: Never Used  Substance and Sexual Activity  . Alcohol use: Yes  . Drug use: No  . Sexual activity: Never  Lifestyle  . Physical activity:    Days per week: Not on file    Minutes per session: Not on file  . Stress: Not on file  Relationships  . Social connections:    Talks on phone: Not on file    Gets together: Not on file    Attends religious service: Not on file    Active member of club or organization: Not on file    Attends meetings of clubs or organizations: Not on file    Relationship status: Not on file  . Intimate partner violence:    Fear of current or ex partner: Not on file    Emotionally abused: Not on file    Physically abused: Not on file    Forced sexual activity: Not on file  Other Topics Concern  . Not on file  Social History Narrative  . Not on file  No family history on file.   Review of Systems  Constitutional: Negative.  Negative for chills and fever.  HENT: Negative.   Eyes: Negative.   Respiratory: Negative.  Negative for cough and shortness of breath.   Cardiovascular: Negative.  Negative for chest pain and palpitations.  Gastrointestinal: Negative.  Negative for abdominal pain, nausea and vomiting.  Genitourinary: Negative.   Musculoskeletal: Negative.   Skin: Negative.  Negative for rash.  Neurological: Negative for dizziness and headaches.  Endo/Heme/Allergies: Negative.   All other systems reviewed and are negative.   Vitals:   06/21/18 1112  BP: 130/82  Pulse: 89  Resp: 18  Temp: 98.6 F (37 C)  SpO2: 96%    Physical Exam  Constitutional: He is oriented to person, place, and time. He appears well-developed and well-nourished.  HENT:  Head: Normocephalic and  atraumatic.  Nose: Nose normal.  Mouth/Throat: Oropharynx is clear and moist.  Eyes: Pupils are equal, round, and reactive to light. Conjunctivae and EOM are normal.  Neck: Normal range of motion. Neck supple.  Cardiovascular: Normal rate and regular rhythm.  Pulmonary/Chest: Effort normal and breath sounds normal.  Abdominal: Soft. He exhibits no distension. There is no tenderness.  Musculoskeletal: Normal range of motion.  Neurological: He is alert and oriented to person, place, and time. No sensory deficit. He exhibits normal muscle tone.  Skin: Skin is warm and dry. Capillary refill takes less than 2 seconds.  Psychiatric: He has a normal mood and affect. His behavior is normal.  Vitals reviewed.    ASSESSMENT & PLAN: [name] was seen today for establish care and medication refill.  Diagnoses and all orders for this visit:  Essential hypertension  Controlled type 2 diabetes mellitus without complication, without long-term current use of insulin (HCC)  Non-seasonal allergic rhinitis due to other allergic trigger  Other orders -     amLODipine (NORVASC) 10 MG tablet; Take 1 tablet (10 mg total) by mouth daily. -     atorvastatin (LIPITOR) 40 MG tablet; Take 1 tablet (40 mg total) by mouth daily. -     fluticasone (FLONASE) 50 MCG/ACT nasal spray; Place 2 sprays into both nostrils daily. -     lisinopril (PRINIVIL,ZESTRIL) 20 MG tablet; Take 1 tablet (20 mg total) by mouth daily. -     metFORMIN (GLUCOPHAGE) 1000 MG tablet; Take 1 tablet (1,000 mg total) by mouth 2 (two) times daily with a meal.    Patient Instructions       If you have lab work done today you will be contacted with your lab results within the next 2 weeks.  If you have not heard from [place] then please contact us. The fastest way to get your results is to register for My Chart.   IF you received an x-ray today, you will receive an invoice from [facility]. Please contact [facility] at  [phone] with questions or concerns regarding your invoice.   IF you received labwork today, you will receive an invoice from [place]. Please contact [facility] at [phone] with questions or concerns regarding your invoice.   Our billing staff will not be able to assist you with questions regarding bills from these companies.  You will be contacted with the lab results as soon as they are available. The fastest way to get your results is to activate your My Chart account. Instructions are located on the last page of this paperwork. If you have not heard from [place] regarding the results in 2 weeks,  please contact this office.     Hypertension Hypertension, commonly called high blood pressure, is when the force of blood pumping through the arteries is too strong. The arteries are the blood vessels that carry blood from the heart throughout the body. Hypertension forces the heart to work harder to pump blood and may cause arteries to become narrow or stiff. Having untreated or uncontrolled hypertension can cause heart attacks, strokes, kidney disease, and other problems. A blood pressure reading consists of a higher number over a lower number. Ideally, your blood pressure should be below 120/80. The first ("top") number is called the systolic pressure. It is a measure of the pressure in your arteries as your heart beats. The second ("bottom") number is called the diastolic pressure. It is a measure of the pressure in your arteries as the heart relaxes. What are the causes? The cause of this condition is not known. What increases the risk? Some risk factors for high blood pressure are under your control. Others are not. Factors you can change  Smoking.  Having type 2 diabetes mellitus, high cholesterol, or both.  Not getting enough exercise or physical activity.  Being overweight.  Having too much fat, sugar, calories, or salt (sodium) in your diet.  Drinking too much alcohol. Factors  that are difficult or impossible to change  Having chronic kidney disease.  Having a family history of high blood pressure.  Age. Risk increases with age.  Race. You may be at higher risk if you are African-American.  Gender. Men are at higher risk than women before age 89. After age 37, women are at higher risk than men.  Having obstructive sleep apnea.  Stress. What are the signs or symptoms? Extremely high blood pressure (hypertensive crisis) may cause:  Headache.  Anxiety.  Shortness of breath.  Nosebleed.  Nausea and vomiting.  Severe chest pain.  Jerky movements you cannot control (seizures).  How is this diagnosed? This condition is diagnosed by measuring your blood pressure while you are seated, with your arm resting on a surface. The cuff of the blood pressure monitor will be placed directly against the skin of your upper arm at the level of your heart. It should be measured at least twice using the same arm. Certain conditions can cause a difference in blood pressure between your right and left arms. Certain factors can cause blood pressure readings to be lower or higher than normal (elevated) for a short period of time:  When your blood pressure is higher when you are in a health care provider's office than when you are at home, this is called white coat hypertension. Most people with this condition do not need medicines.  When your blood pressure is higher at home than when you are in a health care provider's office, this is called masked hypertension. Most people with this condition may need medicines to control blood pressure.  If you have a high blood pressure reading during one visit or you have normal blood pressure with other risk factors:  You may be asked to return on a different day to have your blood pressure checked again.  You may be asked to monitor your blood pressure at home for 1 week or longer.  If you are diagnosed with hypertension, you may  have other blood or imaging tests to help your health care provider understand your overall risk for other conditions. How is this treated? This condition is treated by making healthy lifestyle changes, such as eating healthy  foods, exercising more, and reducing your alcohol intake. Your health care provider may prescribe medicine if lifestyle changes are not enough to get your blood pressure under control, and if:  Your systolic blood pressure is above 130.  Your diastolic blood pressure is above 80.  Your personal target blood pressure may vary depending on your medical conditions, your age, and other factors. Follow these instructions at home: Eating and drinking  Eat a diet that is high in fiber and potassium, and low in sodium, added sugar, and fat. An example eating plan is called the DASH (Dietary Approaches to Stop Hypertension) diet. To eat this way: ? Eat plenty of fresh fruits and vegetables. Try to fill half of your plate at each meal with fruits and vegetables. ? Eat whole grains, such as whole wheat pasta, brown rice, or whole grain bread. Fill about one quarter of your plate with whole grains. ? Eat or drink low-fat dairy products, such as skim milk or low-fat yogurt. ? Avoid fatty cuts of meat, processed or cured meats, and poultry with skin. Fill about one quarter of your plate with lean proteins, such as fish, chicken without skin, beans, eggs, and tofu. ? Avoid premade and processed foods. These tend to be higher in sodium, added sugar, and fat.  Reduce your daily sodium intake. Most people with hypertension should eat less than 1,500 mg of sodium a day.  Limit alcohol intake to no more than 1 drink a day for nonpregnant women and 2 drinks a day for men. One drink equals 12 oz of beer, 5 oz of wine, or 1 oz of hard liquor. Lifestyle  Work with your health care provider to maintain a healthy body weight or to lose weight. Ask what an ideal weight is for you.  Get at least  30 minutes of exercise that causes your heart to beat faster (aerobic exercise) most days of the week. Activities may include walking, swimming, or biking.  Include exercise to strengthen your muscles (resistance exercise), such as pilates or lifting weights, as part of your weekly exercise routine. Try to do these types of exercises for 30 minutes at least 3 days a week.  Do not use any products that contain nicotine or tobacco, such as cigarettes and e-cigarettes. If you need help quitting, ask your health care provider.  Monitor your blood pressure at home as told by your health care provider.  Keep all follow-up visits as told by your health care provider. This is important. Medicines  Take over-the-counter and prescription medicines only as told by your health care provider. Follow directions carefully. Blood pressure medicines must be taken as prescribed.  Do not skip doses of blood pressure medicine. Doing this puts you at risk for problems and can make the medicine less effective.  Ask your health care provider about side effects or reactions to medicines that you should watch for. Contact a health care provider if:  You think you are having a reaction to a medicine you are taking.  You have headaches that keep coming back (recurring).  You feel dizzy.  You have swelling in your ankles.  You have trouble with your vision. Get help right away if:  You develop a severe headache or confusion.  You have unusual weakness or numbness.  You feel faint.  You have severe pain in your chest or abdomen.  You vomit repeatedly.  You have trouble breathing. Summary  Hypertension is when the force of blood pumping through your arteries  is too strong. If this condition is not controlled, it may put you at risk for serious complications.  Your personal target blood pressure may vary depending on your medical conditions, your age, and other factors. For most people, a normal blood  pressure is less than 120/80.  Hypertension is treated with lifestyle changes, medicines, or a combination of both. Lifestyle changes include weight loss, eating a healthy, low-sodium diet, exercising more, and limiting alcohol. This information is not intended to replace advice given to you by your health care provider. Make sure you discuss any questions you have with your health care provider. Document Released: 07/14/2005 Document Revised: 06/11/2016 Document Reviewed: 06/11/2016 Elsevier Interactive Patient Education  2018 Elsevier Inc.      Agustina Caroli, MD [facility]

## 2018-06-21 NOTE — Patient Instructions (Addendum)
   If you have lab work done today you will be contacted with your lab results within the next 2 weeks.  If you have not heard from us then please contact us. The fastest way to get your results is to register for My Chart.   IF you received an x-ray today, you will receive an invoice from [facility]. Please contact [facility] at [phone] with questions or concerns regarding your invoice.   IF you received labwork today, you will receive an invoice from [facility]. Please contact [facility] at [phone] with questions or concerns regarding your invoice.   Our billing staff will not be able to assist you with questions regarding bills from these companies.  You will be contacted with the lab results as soon as they are available. The fastest way to get your results is to activate your My Chart account. Instructions are located on the last page of this paperwork. If you have not heard from us regarding the results in 2 weeks, please contact this office.     Hypertension Hypertension, commonly called high blood pressure, is when the force of blood pumping through the arteries is too strong. The arteries are the blood vessels that carry blood from the heart throughout the body. Hypertension forces the heart to work harder to pump blood and may cause arteries to become narrow or stiff. Having untreated or uncontrolled hypertension can cause heart attacks, strokes, kidney disease, and other problems. A blood pressure reading consists of a higher number over a lower number. Ideally, your blood pressure should be below 120/80. The first ("top") number is called the systolic pressure. It is a measure of the pressure in your arteries as your heart beats. The second ("bottom") number is called the diastolic pressure. It is a measure of the pressure in your arteries as the heart relaxes. What are the causes? The cause of this condition is not known. What increases the risk? Some  risk factors for high blood pressure are under your control. Others are not. Factors you can change  Smoking.  Having type 2 diabetes mellitus, high cholesterol, or both.  Not getting enough exercise or physical activity.  Being overweight.  Having too much fat, sugar, calories, or salt (sodium) in your diet.  Drinking too much alcohol. Factors that are difficult or impossible to change  Having chronic kidney disease.  Having a family history of high blood pressure.  Age. Risk increases with age.  Race. You may be at higher risk if you are African-American.  Gender. Men are at higher risk than women before age 45. After age 65, women are at higher risk than men.  Having obstructive sleep apnea.  Stress. What are the signs or symptoms? Extremely high blood pressure (hypertensive crisis) may cause:  Headache.  Anxiety.  Shortness of breath.  Nosebleed.  Nausea and vomiting.  Severe chest pain.  Jerky movements you cannot control (seizures).  How is this diagnosed? This condition is diagnosed by measuring your blood pressure while you are seated, with your arm resting on a surface. The cuff of the blood pressure monitor will be placed directly against the skin of your upper arm at the level of your heart. It should be measured at least twice using the same arm. Certain conditions can cause a difference in blood pressure between your right and left arms. Certain factors can cause blood pressure readings to be lower or higher than normal (elevated) for a short period of time:  When   your blood pressure is higher when you are in a health care provider's office than when you are at home, this is called white coat hypertension. Most people with this condition do not need medicines.  When your blood pressure is higher at home than when you are in a health care provider's office, this is called masked hypertension. Most people with this condition may need medicines to control  blood pressure.  If you have a high blood pressure reading during one visit or you have normal blood pressure with other risk factors:  You may be asked to return on a different day to have your blood pressure checked again.  You may be asked to monitor your blood pressure at home for 1 week or longer.  If you are diagnosed with hypertension, you may have other blood or imaging tests to help your health care provider understand your overall risk for other conditions. How is this treated? This condition is treated by making healthy lifestyle changes, such as eating healthy foods, exercising more, and reducing your alcohol intake. Your health care provider may prescribe medicine if lifestyle changes are not enough to get your blood pressure under control, and if:  Your systolic blood pressure is above 130.  Your diastolic blood pressure is above 80.  Your personal target blood pressure may vary depending on your medical conditions, your age, and other factors. Follow these instructions at home: Eating and drinking  Eat a diet that is high in fiber and potassium, and low in sodium, added sugar, and fat. An example eating plan is called the DASH (Dietary Approaches to Stop Hypertension) diet. To eat this way: ? Eat plenty of fresh fruits and vegetables. Try to fill half of your plate at each meal with fruits and vegetables. ? Eat whole grains, such as whole wheat pasta, brown rice, or whole grain bread. Fill about one quarter of your plate with whole grains. ? Eat or drink low-fat dairy products, such as skim milk or low-fat yogurt. ? Avoid fatty cuts of meat, processed or cured meats, and poultry with skin. Fill about one quarter of your plate with lean proteins, such as fish, chicken without skin, beans, eggs, and tofu. ? Avoid premade and processed foods. These tend to be higher in sodium, added sugar, and fat.  Reduce your daily sodium intake. Most people with hypertension should eat less  than 1,500 mg of sodium a day.  Limit alcohol intake to no more than 1 drink a day for nonpregnant women and 2 drinks a day for men. One drink equals 12 oz of beer, 5 oz of wine, or 1 oz of hard liquor. Lifestyle  Work with your health care provider to maintain a healthy body weight or to lose weight. Ask what an ideal weight is for you.  Get at least 30 minutes of exercise that causes your heart to beat faster (aerobic exercise) most days of the week. Activities may include walking, swimming, or biking.  Include exercise to strengthen your muscles (resistance exercise), such as pilates or lifting weights, as part of your weekly exercise routine. Try to do these types of exercises for 30 minutes at least 3 days a week.  Do not use any products that contain nicotine or tobacco, such as cigarettes and e-cigarettes. If you need help quitting, ask your health care provider.  Monitor your blood pressure at home as told by your health care provider.  Keep all follow-up visits as told by your health care provider.   This is important. Medicines  Take over-the-counter and prescription medicines only as told by your health care provider. Follow directions carefully. Blood pressure medicines must be taken as prescribed.  Do not skip doses of blood pressure medicine. Doing this puts you at risk for problems and can make the medicine less effective.  Ask your health care provider about side effects or reactions to medicines that you should watch for. Contact a health care provider if:  You think you are having a reaction to a medicine you are taking.  You have headaches that keep coming back (recurring).  You feel dizzy.  You have swelling in your ankles.  You have trouble with your vision. Get help right away if:  You develop a severe headache or confusion.  You have unusual weakness or numbness.  You feel faint.  You have severe pain in your chest or abdomen.  You vomit  repeatedly.  You have trouble breathing. Summary  Hypertension is when the force of blood pumping through your arteries is too strong. If this condition is not controlled, it may put you at risk for serious complications.  Your personal target blood pressure may vary depending on your medical conditions, your age, and other factors. For most people, a normal blood pressure is less than 120/80.  Hypertension is treated with lifestyle changes, medicines, or a combination of both. Lifestyle changes include weight loss, eating a healthy, low-sodium diet, exercising more, and limiting alcohol. This information is not intended to replace advice given to you by your health care provider. Make sure you discuss any questions you have with your health care provider. Document Released: 07/14/2005 Document Revised: 06/11/2016 Document Reviewed: 06/11/2016 Elsevier Interactive Patient Education  2018 [employer] Inc.  

## 2018-10-20 ENCOUNTER — Ambulatory Visit: Payer: Self-pay | Admitting: Emergency Medicine

## 2018-12-21 ENCOUNTER — Ambulatory Visit: Payer: Self-pay | Admitting: Emergency Medicine

## 2019-03-31 ENCOUNTER — Telehealth: Payer: Self-pay | Admitting: *Deleted

## 2019-03-31 NOTE — Telephone Encounter (Signed)
Faxed Rx request for Cetirzine 10 mg, request denied patient needs an appointment for additional refills. Confirmation page at 12:14 pm.

## 2019-07-10 ENCOUNTER — Other Ambulatory Visit: Payer: Self-pay | Admitting: Emergency Medicine

## 2019-07-11 NOTE — Telephone Encounter (Signed)
Requested medication (s) are due for refill today: yes  Requested medication (s) are on the active medication list: yes  Last refill: 04/19/2019  Future visit scheduled: no  Notes to clinic: LOV-06/21/2018 Review for refill   Requested Prescriptions  Pending Prescriptions Disp Refills   metFORMIN (GLUCOPHAGE) 1000 MG tablet [Pharmacy Med Name: metFORMIN HCl 1000 MG Oral Tablet] 180 tablet 0    Sig: TAKE 1 TABLET BY MOUTH TWICE DAILY WITH A MEAL      Endocrinology:  Diabetes - Biguanides Failed - 07/10/2019  4:01 PM      Failed - Cr in normal range and within 360 days    Creat  Date Value Ref Range Status  05/16/2013 0.96 0.50 - 1.35 mg/dL Final   Creatinine, Ser  Date Value Ref Range Status  04/20/2018 0.98 0.76 - 1.27 mg/dL Final          Failed - HBA1C is between 0 and 7.9 and within 180 days    Hemoglobin A1C  Date Value Ref Range Status  04/20/2018 7.4 (A) 4.0 - 5.6 % Final          Failed - eGFR in normal range and within 360 days    GFR calc Af Amer  Date Value Ref Range Status  04/20/2018 94 >59 mL/min/1.73 Final   GFR calc non Af Amer  Date Value Ref Range Status  04/20/2018 82 >59 mL/min/1.73 Final          Failed - Valid encounter within last 6 months    Recent Outpatient Visits           1 year ago Essential hypertension   [facility] at [place], [place], [place]   1 year ago Controlled type 2 diabetes mellitus without complication, without long-term current use of insulin [facility])   [facility] at [place], PA-C   1 year ago Controlled type 2 diabetes mellitus without complication, without long-term current use of insulin [facility])   [facility] at [place], PA-C   1 year ago Essential hypertension   Primary Care at [facility], [place], [place]   2 years ago Essential hypertension   [facility] at [place], [place]                amLODipine ([place]) 10 MG tablet [Pharmacy Med Name: amLODIPine Besylate 10 MG  Oral Tablet] 90 tablet 0    Sig: Take 1 tablet by mouth once daily      Cardiovascular:  Calcium Channel Blockers Failed - 07/10/2019  4:01 PM      Failed - Valid encounter within last 6 months    Recent Outpatient Visits           1 year ago Essential hypertension   [facility] at [place], [place], [place]   1 year ago Controlled type 2 diabetes mellitus without complication, without long-term current use of insulin [facility])   Primary Care at [place], [place]   1 year ago Controlled type 2 diabetes mellitus without complication, without long-term current use of insulin [facility])   Primary Care at [place], [place]   1 year ago Essential hypertension   Primary Care at [facility], [place], [place]   2 years ago Essential hypertension   Primary Care at [place], [place]              Passed - Last BP in normal range    BP Readings from Last 1  Encounters:  06/21/18 130/82

## 2019-07-12 NOTE — Telephone Encounter (Signed)
No refills without office visit. Been a year seen last seen

## 2019-07-18 NOTE — Telephone Encounter (Signed)
Pt refused appt because his insurance is out of network with [facility]. Pt would like a refill until he can est care with [facility]. Please advise.  Pt aware that his refill can continue to be denied without an appt.

## 2019-12-29 ENCOUNTER — Encounter: Payer: Self-pay | Admitting: Emergency Medicine

## 2019-12-29 ENCOUNTER — Other Ambulatory Visit: Payer: Self-pay

## 2019-12-29 ENCOUNTER — Ambulatory Visit (INDEPENDENT_AMBULATORY_CARE_PROVIDER_SITE_OTHER): Payer: Medicare Other | Admitting: Emergency Medicine

## 2019-12-29 VITALS — BP 164/105 | HR 121 | Temp 97.4°F | Ht 72.5 in | Wt 360.0 lb

## 2019-12-29 DIAGNOSIS — Z1159 Encounter for screening for other viral diseases: Secondary | ICD-10-CM | POA: Diagnosis not present

## 2019-12-29 DIAGNOSIS — I10 Essential (primary) hypertension: Secondary | ICD-10-CM

## 2019-12-29 DIAGNOSIS — R6889 Other general symptoms and signs: Secondary | ICD-10-CM | POA: Diagnosis not present

## 2019-12-29 DIAGNOSIS — E1165 Type 2 diabetes mellitus with hyperglycemia: Secondary | ICD-10-CM | POA: Diagnosis not present

## 2019-12-29 DIAGNOSIS — I152 Hypertension secondary to endocrine disorders: Secondary | ICD-10-CM | POA: Insufficient documentation

## 2019-12-29 DIAGNOSIS — Z6841 Body Mass Index (BMI) 40.0 and over, adult: Secondary | ICD-10-CM | POA: Diagnosis not present

## 2019-12-29 DIAGNOSIS — Z23 Encounter for immunization: Secondary | ICD-10-CM

## 2019-12-29 DIAGNOSIS — E1159 Type 2 diabetes mellitus with other circulatory complications: Secondary | ICD-10-CM | POA: Diagnosis not present

## 2019-12-29 DIAGNOSIS — Z1211 Encounter for screening for malignant neoplasm of colon: Secondary | ICD-10-CM

## 2019-12-29 LAB — GLUCOSE, POCT (MANUAL RESULT ENTRY): POC Glucose: 184 mg/dl — AB (ref 70–99)

## 2019-12-29 LAB — POCT GLYCOSYLATED HEMOGLOBIN (HGB A1C): Hemoglobin A1C: 9.9 % — AB (ref 4.0–5.6)

## 2019-12-29 MED ORDER — LISINOPRIL 20 MG PO TABS: 20.0000 mg | ORAL_TABLET | Freq: Every day | ORAL | 3 refills | Status: DC

## 2019-12-29 MED ORDER — ATORVASTATIN CALCIUM 40 MG PO TABS: 40.0000 mg | ORAL_TABLET | Freq: Every day | ORAL | 3 refills | Status: DC

## 2019-12-29 MED ORDER — METFORMIN HCL 1000 MG PO TABS: 1000.0000 mg | ORAL_TABLET | Freq: Two times a day (BID) | ORAL | 3 refills | Status: DC

## 2019-12-29 MED ORDER — AMLODIPINE BESYLATE 10 MG PO TABS: 10.0000 mg | ORAL_TABLET | Freq: Every day | ORAL | 3 refills | Status: DC

## 2019-12-29 MED ORDER — TRULICITY 0.75 MG/0.5ML ~~LOC~~ SOAJ: 0.7500 mg | SUBCUTANEOUS | 5 refills | Status: DC

## 2019-12-29 NOTE — Patient Instructions (Addendum)
   If you have lab work done today you will be contacted with your lab results within the next 2 weeks.  If you have not heard from us then please contact us. The fastest way to get your results is to register for My Chart.   IF you received an x-ray today, you will receive an invoice from [facility]. Please contact [facility] at [phone] with questions or concerns regarding your invoice.   IF you received labwork today, you will receive an invoice from [facility]. Please contact [facility] at [phone] with questions or concerns regarding your invoice.   Our billing staff will not be able to assist you with questions regarding bills from these companies.  You will be contacted with the lab results as soon as they are available. The fastest way to get your results is to activate your My Chart account. Instructions are located on the last page of this paperwork. If you have not heard from us regarding the results in 2 weeks, please contact this office.      Hypertension, Adult High blood pressure (hypertension) is when the force of blood pumping through the arteries is too strong. The arteries are the blood vessels that carry blood from the heart throughout the body. Hypertension forces the heart to work harder to pump blood and may cause arteries to become narrow or stiff. Untreated or uncontrolled hypertension can cause a heart attack, heart failure, a stroke, kidney disease, and other problems. A blood pressure reading consists of a higher number over a lower number. Ideally, your blood pressure should be below 120/80. The first ("top") number is called the systolic pressure. It is a measure of the pressure in your arteries as your heart beats. The second ("bottom") number is called the diastolic pressure. It is a measure of the pressure in your arteries as the heart relaxes. What are the causes? The exact cause of this condition is not known. There are some conditions  that result in or are related to high blood pressure. What increases the risk? Some risk factors for high blood pressure are under your control. The following factors may make you more likely to develop this condition:  Smoking.  Having type 2 diabetes mellitus, high cholesterol, or both.  Not getting enough exercise or physical activity.  Being overweight.  Having too much fat, sugar, calories, or salt (sodium) in your diet.  Drinking too much alcohol. Some risk factors for high blood pressure may be difficult or impossible to change. Some of these factors include:  Having chronic kidney disease.  Having a family history of high blood pressure.  Age. Risk increases with age.  Race. You may be at higher risk if you are African American.  Gender. Men are at higher risk than women before age 45. After age 65, women are at higher risk than men.  Having obstructive sleep apnea.  Stress. What are the signs or symptoms? High blood pressure may not cause symptoms. Very high blood pressure (hypertensive crisis) may cause:  Headache.  Anxiety.  Shortness of breath.  Nosebleed.  Nausea and vomiting.  Vision changes.  Severe chest pain.  Seizures. How is this diagnosed? This condition is diagnosed by measuring your blood pressure while you are seated, with your arm resting on a flat surface, your legs uncrossed, and your feet flat on the floor. The cuff of the blood pressure monitor will be placed directly against the skin of your upper arm at the level of your   heart. It should be measured at least twice using the same arm. Certain conditions can cause a difference in blood pressure between your right and left arms. Certain factors can cause blood pressure readings to be lower or higher than normal for a short period of time:  When your blood pressure is higher when you are in a health care provider's office than when you are at home, this is called white coat hypertension.  Most people with this condition do not need medicines.  When your blood pressure is higher at home than when you are in a health care provider's office, this is called masked hypertension. Most people with this condition may need medicines to control blood pressure. If you have a high blood pressure reading during one visit or you have normal blood pressure with other risk factors, you may be asked to:  Return on a different day to have your blood pressure checked again.  Monitor your blood pressure at home for 1 week or longer. If you are diagnosed with hypertension, you may have other blood or imaging tests to help your health care provider understand your overall risk for other conditions. How is this treated? This condition is treated by making healthy lifestyle changes, such as eating healthy foods, exercising more, and reducing your alcohol intake. Your health care provider may prescribe medicine if lifestyle changes are not enough to get your blood pressure under control, and if:  Your systolic blood pressure is above 130.  Your diastolic blood pressure is above 80. Your personal target blood pressure may vary depending on your medical conditions, your age, and other factors. Follow these instructions at home: Eating and drinking   Eat a diet that is high in fiber and potassium, and low in sodium, added sugar, and fat. An example eating plan is called the DASH (Dietary Approaches to Stop Hypertension) diet. To eat this way: ? Eat plenty of fresh fruits and vegetables. Try to fill one half of your plate at each meal with fruits and vegetables. ? Eat whole grains, such as whole-wheat pasta, brown rice, or whole-grain bread. Fill about one fourth of your plate with whole grains. ? Eat or drink low-fat dairy products, such as skim milk or low-fat yogurt. ? Avoid fatty cuts of meat, processed or cured meats, and poultry with skin. Fill about one fourth of your plate with lean proteins, such  as fish, chicken without skin, beans, eggs, or tofu. ? Avoid pre-made and processed foods. These tend to be higher in sodium, added sugar, and fat.  Reduce your daily sodium intake. Most people with hypertension should eat less than 1,500 mg of sodium a day.  Do not drink alcohol if: ? Your health care provider tells you not to drink. ? You are pregnant, may be pregnant, or are planning to become pregnant.  If you drink alcohol: ? Limit how much you use to:  0-1 drink a day for women.  0-2 drinks a day for men. ? Be aware of how much alcohol is in your drink. In the [place] one drink equals one 12 oz bottle of beer (355 mL), one 5 oz glass of wine (148 mL), or one 1 oz glass of hard liquor (44 mL). Lifestyle   Work with your health care provider to maintain a healthy body weight or to lose weight. Ask what an ideal weight is for you.  Get at least 30 minutes of exercise most days of the week. Activities may include walking, swimming,   or biking.  Include exercise to strengthen your muscles (resistance exercise), such as Pilates or lifting weights, as part of your weekly exercise routine. Try to do these types of exercises for 30 minutes at least 3 days a week.  Do not use any products that contain nicotine or tobacco, such as cigarettes, e-cigarettes, and chewing tobacco. If you need help quitting, ask your health care provider.  Monitor your blood pressure at home as told by your health care provider.  Keep all follow-up visits as told by your health care provider. This is important. Medicines  Take over-the-counter and prescription medicines only as told by your health care provider. Follow directions carefully. Blood pressure medicines must be taken as prescribed.  Do not skip doses of blood pressure medicine. Doing this puts you at risk for problems and can make the medicine less effective.  Ask your health care provider about side effects or reactions to medicines that you  should watch for. Contact a health care provider if you:  Think you are having a reaction to a medicine you are taking.  Have headaches that keep coming back (recurring).  Feel dizzy.  Have swelling in your ankles.  Have trouble with your vision. Get help right away if you:  Develop a severe headache or confusion.  Have unusual weakness or numbness.  Feel faint.  Have severe pain in your chest or abdomen.  Vomit repeatedly.  Have trouble breathing. Summary  Hypertension is when the force of blood pumping through your arteries is too strong. If this condition is not controlled, it may put you at risk for serious complications.  Your personal target blood pressure may vary depending on your medical conditions, your age, and other factors. For most people, a normal blood pressure is less than 120/80.  Hypertension is treated with lifestyle changes, medicines, or a combination of both. Lifestyle changes include losing weight, eating a healthy, low-sodium diet, exercising more, and limiting alcohol. This information is not intended to replace advice given to you by your health care provider. Make sure you discuss any questions you have with your health care provider. Document Revised: 03/24/2018 Document Reviewed: 03/24/2018 Elsevier Patient Education  [place].  Diabetes Mellitus and Nutrition, Adult When you have diabetes (diabetes mellitus), it is very important to have healthy eating habits because your blood sugar (glucose) levels are greatly affected by what you eat and drink. Eating healthy foods in the appropriate amounts, at about the same times every day, can help you:  Control your blood glucose.  Lower your risk of heart disease.  Improve your blood pressure.  Reach or maintain a healthy weight. Every person with diabetes is different, and each person has different needs for a meal plan. Your health care provider may recommend that you work with a diet  and nutrition specialist (dietitian) to make a meal plan that is best for you. Your meal plan may vary depending on factors such as:  The calories you need.  The medicines you take.  Your weight.  Your blood glucose, blood pressure, and cholesterol levels.  Your activity level.  Other health conditions you have, such as heart or kidney disease. How do carbohydrates affect me? Carbohydrates, also called carbs, affect your blood glucose level more than any other type of food. Eating carbs naturally raises the amount of glucose in your blood. Carb counting is a method for keeping track of how many carbs you eat. Counting carbs is important to keep your blood glucose  at a healthy level, especially if you use insulin or take certain oral diabetes medicines. It is important to know how many carbs you can safely have in each meal. This is different for every person. Your dietitian can help you calculate how many carbs you should have at each meal and for each snack. Foods that contain carbs include:  Bread, cereal, rice, pasta, and crackers.  Potatoes and corn.  Peas, beans, and lentils.  Milk and yogurt.  Fruit and juice.  Desserts, such as cakes, cookies, ice cream, and candy. How does alcohol affect me? Alcohol can cause a sudden decrease in blood glucose (hypoglycemia), especially if you use insulin or take certain oral diabetes medicines. Hypoglycemia can be a life-threatening condition. Symptoms of hypoglycemia (sleepiness, dizziness, and confusion) are similar to symptoms of having too much alcohol. If your health care provider says that alcohol is safe for you, follow these guidelines:  Limit alcohol intake to no more than 1 drink per day for nonpregnant women and 2 drinks per day for men. One drink equals 12 oz of beer, 5 oz of wine, or 1 oz of hard liquor.  Do not drink on an empty stomach.  Keep yourself hydrated with water, diet soda, or unsweetened iced tea.  Keep in  mind that regular soda, juice, and other mixers may contain a lot of sugar and must be counted as carbs. What are tips for following this plan?  Reading food labels  Start by checking the serving size on the "Nutrition Facts" label of packaged foods and drinks. The amount of calories, carbs, fats, and other nutrients listed on the label is based on one serving of the item. Many items contain more than one serving per package.  Check the total grams (g) of carbs in one serving. You can calculate the number of servings of carbs in one serving by dividing the total carbs by 15. For example, if a food has 30 g of total carbs, it would be equal to 2 servings of carbs.  Check the number of grams (g) of saturated and trans fats in one serving. Choose foods that have low or no amount of these fats.  Check the number of milligrams (mg) of salt (sodium) in one serving. Most people should limit total sodium intake to less than 2,300 mg per day.  Always check the nutrition information of foods labeled as "low-fat" or "nonfat". These foods may be higher in added sugar or refined carbs and should be avoided.  Talk to your dietitian to identify your daily goals for nutrients listed on the label. Shopping  Avoid buying canned, premade, or processed foods. These foods tend to be high in fat, sodium, and added sugar.  Shop around the outside edge of the grocery store. This includes fresh fruits and vegetables, bulk grains, fresh meats, and fresh dairy. Cooking  Use low-heat cooking methods, such as baking, instead of high-heat cooking methods like deep frying.  Cook using healthy oils, such as olive, canola, or sunflower oil.  Avoid cooking with butter, cream, or high-fat meats. Meal planning  Eat meals and snacks regularly, preferably at the same times every day. Avoid going long periods of time without eating.  Eat foods high in fiber, such as fresh fruits, vegetables, beans, and whole grains. Talk  to your dietitian about how many servings of carbs you can eat at each meal.  Eat 4-6 ounces (oz) of lean protein each day, such as lean meat, chicken, fish, eggs, or  tofu. One oz of lean protein is equal to: ? 1 oz of meat, chicken, or fish. ? 1 egg. ?  cup of tofu.  Eat some foods each day that contain healthy fats, such as avocado, nuts, seeds, and fish. Lifestyle  Check your blood glucose regularly.  Exercise regularly as told by your health care provider. This may include: ? 150 minutes of moderate-intensity or vigorous-intensity exercise each week. This could be brisk walking, biking, or water aerobics. ? Stretching and doing strength exercises, such as yoga or weightlifting, at least 2 times a week.  Take medicines as told by your health care provider.  Do not use any products that contain nicotine or tobacco, such as cigarettes and e-cigarettes. If you need help quitting, ask your health care provider.  Work with a Social worker or diabetes educator to identify strategies to manage stress and any emotional and social challenges. Questions to ask a health care provider  Do I need to meet with a diabetes educator?  Do I need to meet with a dietitian?  What number can I call if I have questions?  When are the best times to check my blood glucose? Where to find more information:  American Diabetes Association: diabetes.org  Academy of Nutrition and Dietetics: www.eatright.[employer] of Diabetes and Digestive and Kidney Diseases (NIH): [employer] Summary  A healthy meal plan will help you control your blood glucose and maintain a healthy lifestyle.  Working with a diet and nutrition specialist (dietitian) can help you make a meal plan that is best for you.  Keep in mind that carbohydrates (carbs) and alcohol have immediate effects on your blood glucose levels. It is important to count carbs and to use alcohol carefully. This information is not intended  to replace advice given to you by your health care provider. Make sure you discuss any questions you have with your health care provider. Document Revised: 06/26/2017 Document Reviewed: 08/18/2016 Elsevier Patient Education  2020 [employer].

## 2019-12-29 NOTE — Assessment & Plan Note (Addendum)
Uncontrolled hypertension.  Start amlodipine 10 mg daily and lisinopril 20 mg daily. Uncontrolled diabetes with hemoglobin A1c of 9.9. Start Metformin 1000 mg twice a day and Trulicity [id number] mg weekly. Diet and nutrition discussed. Follow-up in 3 months

## 2019-12-29 NOTE — Progress Notes (Signed)
Darius Ingram 64 y.o.   Chief Complaint  Patient presents with  . Hypertension    has not had any medication in 4 months due to no insurance  . Diabetes    no medications in 4 months     HISTORY OF PRESENT ILLNESS: This is a 64 y.o. male with history of diabetes and hypertension here for follow-up.  Has been off medications for at least 4 months due to lack of insurance. Fully vaccinated against Covid.  HPI   Prior to Admission medications   Medication Sig Start Date End Date Taking? Authorizing Provider  acetaminophen (TYLENOL) 325 MG tablet Take 650 mg by mouth every 6 (six) hours as needed.   Yes [provider]  aspirin 81 MG EC tablet TAKE 1 TABLET BY MOUTH EVERY DAY 04/16/18  Yes Jaynee Eagles, PA-C  cetirizine (ZYRTEC) 10 MG tablet Take 1 tablet (10 mg total) by mouth daily. 01/21/18  Yes Jaynee Eagles, PA-C  fluticasone (FLONASE) 50 MCG/ACT nasal spray Place 2 sprays into both nostrils daily. 06/21/18  Yes Muadh Creasy, Ines Bloomer, MD  amLODipine (NORVASC) 10 MG tablet Take 1 tablet (10 mg total) by mouth daily. Patient not taking: Reported on 12/29/2019 06/21/18   Horald Pollen, MD  atorvastatin (LIPITOR) 40 MG tablet Take 1 tablet (40 mg total) by mouth daily. Patient not taking: Reported on 12/29/2019 06/21/18   Horald Pollen, MD  lisinopril (PRINIVIL,ZESTRIL) 20 MG tablet Take 1 tablet (20 mg total) by mouth daily. Patient not taking: Reported on 12/29/2019 06/21/18   Horald Pollen, MD  metFORMIN (GLUCOPHAGE) 1000 MG tablet Take 1 tablet (1,000 mg total) by mouth 2 (two) times daily with a meal. Patient not taking: Reported on 12/29/2019 06/21/18   Horald Pollen, MD    Allergies  Allergen Reactions  . Penicillins Rash    There are no problems to display for this patient.   Past Medical History:  Diagnosis Date  . Allergy   . Diabetes mellitus without complication ([place])   . Hypertension     History reviewed. No pertinent  surgical history.  Social History   Socioeconomic History  . Marital status: Single    Spouse name: Not on file  . Number of children: Not on file  . Years of education: Not on file  . Highest education level: Not on file  Occupational History  . Not on file  Tobacco Use  . Smoking status: Former Smoker    Packs/day: 1.00    Years: 10.00    Pack years: 10.00    Quit date: 12/29/1998    Years since quitting: 21.0  . Smokeless tobacco: Never Used  Substance and Sexual Activity  . Alcohol use: Yes  . Drug use: No  . Sexual activity: Never  Other Topics Concern  . Not on file  Social History Narrative  . Not on file   Social Determinants of Health   Financial Resource Strain:   . Difficulty of Paying Living Expenses:   Food Insecurity:   . Worried About Charity fundraiser in the Last Year:   . Arboriculturist in the Last Year:   Transportation Needs:   . Film/video editor (Medical):   Marland Kitchen Lack of Transportation (Non-Medical):   Physical Activity:   . Days of Exercise per Week:   . Minutes of Exercise per Session:   Stress:   . Feeling of Stress :   Social Connections:   . Frequency of Communication  with Friends and Family:   . Frequency of Social Gatherings with Friends and Family:   . Attends Religious Services:   . Active Member of Clubs or Organizations:   . Attends Archivist Meetings:   Marland Kitchen Marital Status:   Intimate Partner Violence:   . Fear of Current or Ex-Partner:   . Emotionally Abused:   Marland Kitchen Physically Abused:   . Sexually Abused:     History reviewed. No pertinent family history.   Review of Systems  Constitutional: Negative.  Negative for chills and fever.  HENT: Negative.  Negative for congestion and sore throat.   Respiratory: Negative.  Negative for cough and shortness of breath.   Cardiovascular: Negative.  Negative for chest pain and palpitations.  Gastrointestinal: Negative.  Negative for abdominal pain, diarrhea, nausea and  vomiting.  Genitourinary: Negative.  Negative for dysuria and hematuria.  Musculoskeletal: Negative.  Negative for myalgias and neck pain.  Skin: Negative.  Negative for rash.  Neurological: Negative.  Negative for dizziness and headaches.  Endo/Heme/Allergies: Negative.   All other systems reviewed and are negative.  Today's Vitals   12/29/19 1315  BP: (!) 164/105  Pulse: (!) 121  Temp: (!) 97.4 F (36.3 C)  SpO2: 92%  Weight: (!) 360 lb (163.3 kg)  Height: 6' 0.5" (1.842 m)   Body mass index is 48.15 kg/m.   Physical Exam Vitals reviewed.  Constitutional:      Appearance: He is obese.  HENT:     Head: Normocephalic.     Mouth/Throat:     Mouth: Mucous membranes are moist.     Pharynx: Oropharynx is clear.  Eyes:     Extraocular Movements: Extraocular movements intact.     Conjunctiva/sclera: Conjunctivae normal.     Pupils: Pupils are equal, round, and reactive to light.  Cardiovascular:     Rate and Rhythm: Normal rate and regular rhythm.     Pulses: Normal pulses.     Heart sounds: Normal heart sounds.  Pulmonary:     Effort: Pulmonary effort is normal.     Breath sounds: Normal breath sounds.  Musculoskeletal:        General: Normal range of motion.     Cervical back: Normal range of motion and neck supple.  Skin:    General: Skin is warm and dry.     Capillary Refill: Capillary refill takes less than 2 seconds.  Neurological:     General: No focal deficit present.     Mental Status: He is alert and oriented to person, place, and time.  Psychiatric:        Mood and Affect: Mood normal.        Behavior: Behavior normal.    A total of 45 minutes was spent with the patient, greater than 50% of which was in counseling/coordination of care regarding uncontrolled diabetes and hypertension, cardiovascular risks associated with these conditions, need for medications, review of all new medications including Trulicity and how to use it, diet and nutrition and need  for physical exercise, review of most recent office visit notes, review of most recent blood work results, prognosis and need for follow-up in 3 months. Results for orders placed or performed in visit on 12/29/19 (from the past 24 hour(s))  POCT glucose (manual entry)     Status: Abnormal   Collection Time: 12/29/19  1:54 PM  Result Value Ref Range   POC Glucose 184 (A) 70 - 99 mg/dl  POCT glycosylated hemoglobin (Hb A1C)  Status: Abnormal   Collection Time: 12/29/19  2:03 PM  Result Value Ref Range   Hemoglobin A1C 9.9 (A) 4.0 - 5.6 %   HbA1c POC (<> result, manual entry)     HbA1c, POC (prediabetic range)     HbA1c, POC (controlled diabetic range)       ASSESSMENT & PLAN: Hypertension associated with diabetes ([place]) Uncontrolled hypertension.  Start amlodipine 10 mg daily and lisinopril 20 mg daily. Uncontrolled diabetes with hemoglobin A1c of 9.9. Start Metformin 1000 mg twice a day and Trulicity 2.35 mg weekly. Diet and nutrition discussed. Follow-up in 3 months   Teryl was seen today for hypertension and diabetes.  Diagnoses and all orders for this visit:  Uncontrolled type 2 diabetes mellitus with hyperglycemia ([place]) -     POCT glycosylated hemoglobin (Hb A1C) -     CMP14+EGFR -     HM Diabetes Foot Exam -     Ambulatory referral to Ophthalmology -     POCT glucose (manual entry) -     metFORMIN (GLUCOPHAGE) 1000 MG tablet; Take 1 tablet (1,000 mg total) by mouth 2 (two) times daily with a meal. -     Dulaglutide (TRULICITY) 3.61 WE/3.1VQ SOPN; Inject 0.75 mg into the skin once a week. -     CBC with Differential/Platelet  Screening for colon cancer -     Cologuard  Essential hypertension -     CMP14+EGFR -     CBC with Differential/Platelet  Immunization due -     Td vaccine greater than or equal to 7yo preservative free IM  Encounter for hepatitis C screening test for low risk patient -     Hepatitis C antibody  Uncontrolled hypertension -     amLODipine  (NORVASC) 10 MG tablet; Take 1 tablet (10 mg total) by mouth daily. -     lisinopril (ZESTRIL) 20 MG tablet; Take 1 tablet (20 mg total) by mouth daily.  Body mass index (BMI) of 45.0-49.9 in adult ([facility]) -     CBC with Differential/Platelet  Hypertension associated with diabetes ([place]) -     atorvastatin (LIPITOR) 40 MG tablet; Take 1 tablet (40 mg total) by mouth daily. -     CBC with Differential/Platelet    Patient Instructions       If you have lab work done today you will be contacted with your lab results within the next 2 weeks.  If you have not heard from [place] then please contact us. The fastest way to get your results is to register for My Chart.   IF you received an x-ray today, you will receive an invoice from [facility]. Please contact [facility] at [phone] with questions or concerns regarding your invoice.   IF you received labwork today, you will receive an invoice from [facility]. Please contact [facility] at [phone] with questions or concerns regarding your invoice.   Our billing staff will not be able to assist you with questions regarding bills from these companies.  You will be contacted with the lab results as soon as they are available. The fastest way to get your results is to activate your My Chart account. Instructions are located on the last page of this paperwork. If you have not heard from [place] regarding the results in 2 weeks, please contact this office.      Hypertension, Adult High blood pressure (hypertension) is when the force of blood pumping through the arteries is too strong. The arteries are the blood  vessels that carry blood from the heart throughout the body. Hypertension forces the heart to work harder to pump blood and may cause arteries to become narrow or stiff. Untreated or uncontrolled hypertension can cause a heart attack, heart failure, a stroke, kidney disease, and other problems. A blood pressure reading consists  of a higher number over a lower number. Ideally, your blood pressure should be below 120/80. The first ("top") number is called the systolic pressure. It is a measure of the pressure in your arteries as your heart beats. The second ("bottom") number is called the diastolic pressure. It is a measure of the pressure in your arteries as the heart relaxes. What are the causes? The exact cause of this condition is not known. There are some conditions that result in or are related to high blood pressure. What increases the risk? Some risk factors for high blood pressure are under your control. The following factors may make you more likely to develop this condition:  Smoking.  Having type 2 diabetes mellitus, high cholesterol, or both.  Not getting enough exercise or physical activity.  Being overweight.  Having too much fat, sugar, calories, or salt (sodium) in your diet.  Drinking too much alcohol. Some risk factors for high blood pressure may be difficult or impossible to change. Some of these factors include:  Having chronic kidney disease.  Having a family history of high blood pressure.  Age. Risk increases with age.  Race. You may be at higher risk if you are African American.  Gender. Men are at higher risk than women before age 20. After age 40, women are at higher risk than men.  Having obstructive sleep apnea.  Stress. What are the signs or symptoms? High blood pressure may not cause symptoms. Very high blood pressure (hypertensive crisis) may cause:  Headache.  Anxiety.  Shortness of breath.  Nosebleed.  Nausea and vomiting.  Vision changes.  Severe chest pain.  Seizures. How is this diagnosed? This condition is diagnosed by measuring your blood pressure while you are seated, with your arm resting on a flat surface, your legs uncrossed, and your feet flat on the floor. The cuff of the blood pressure monitor will be placed directly against the skin of your  upper arm at the level of your heart. It should be measured at least twice using the same arm. Certain conditions can cause a difference in blood pressure between your right and left arms. Certain factors can cause blood pressure readings to be lower or higher than normal for a short period of time:  When your blood pressure is higher when you are in a health care provider's office than when you are at home, this is called white coat hypertension. Most people with this condition do not need medicines.  When your blood pressure is higher at home than when you are in a health care provider's office, this is called masked hypertension. Most people with this condition may need medicines to control blood pressure. If you have a high blood pressure reading during one visit or you have normal blood pressure with other risk factors, you may be asked to:  Return on a different day to have your blood pressure checked again.  Monitor your blood pressure at home for 1 week or longer. If you are diagnosed with hypertension, you may have other blood or imaging tests to help your health care provider understand your overall risk for other conditions. How is this treated? This condition is treated  by making healthy lifestyle changes, such as eating healthy foods, exercising more, and reducing your alcohol intake. Your health care provider may prescribe medicine if lifestyle changes are not enough to get your blood pressure under control, and if:  Your systolic blood pressure is above 130.  Your diastolic blood pressure is above 80. Your personal target blood pressure may vary depending on your medical conditions, your age, and other factors. Follow these instructions at home: Eating and drinking   Eat a diet that is high in fiber and potassium, and low in sodium, added sugar, and fat. An example eating plan is called the DASH (Dietary Approaches to Stop Hypertension) diet. To eat this way: ? Eat plenty of  fresh fruits and vegetables. Try to fill one half of your plate at each meal with fruits and vegetables. ? Eat whole grains, such as whole-wheat pasta, brown rice, or whole-grain bread. Fill about one fourth of your plate with whole grains. ? Eat or drink low-fat dairy products, such as skim milk or low-fat yogurt. ? Avoid fatty cuts of meat, processed or cured meats, and poultry with skin. Fill about one fourth of your plate with lean proteins, such as fish, chicken without skin, beans, eggs, or tofu. ? Avoid pre-made and processed foods. These tend to be higher in sodium, added sugar, and fat.  Reduce your daily sodium intake. Most people with hypertension should eat less than 1,500 mg of sodium a day.  Do not drink alcohol if: ? Your health care provider tells you not to drink. ? You are pregnant, may be pregnant, or are planning to become pregnant.  If you drink alcohol: ? Limit how much you use to:  0-1 drink a day for women.  0-2 drinks a day for men. ? Be aware of how much alcohol is in your drink. In the [place] one drink equals one 12 oz bottle of beer (355 mL), one 5 oz glass of wine (148 mL), or one 1 oz glass of hard liquor (44 mL). Lifestyle   Work with your health care provider to maintain a healthy body weight or to lose weight. Ask what an ideal weight is for you.  Get at least 30 minutes of exercise most days of the week. Activities may include walking, swimming, or biking.  Include exercise to strengthen your muscles (resistance exercise), such as Pilates or lifting weights, as part of your weekly exercise routine. Try to do these types of exercises for 30 minutes at least 3 days a week.  Do not use any products that contain nicotine or tobacco, such as cigarettes, e-cigarettes, and chewing tobacco. If you need help quitting, ask your health care provider.  Monitor your blood pressure at home as told by your health care provider.  Keep all follow-up visits as told by  your health care provider. This is important. Medicines  Take over-the-counter and prescription medicines only as told by your health care provider. Follow directions carefully. Blood pressure medicines must be taken as prescribed.  Do not skip doses of blood pressure medicine. Doing this puts you at risk for problems and can make the medicine less effective.  Ask your health care provider about side effects or reactions to medicines that you should watch for. Contact a health care provider if you:  Think you are having a reaction to a medicine you are taking.  Have headaches that keep coming back (recurring).  Feel dizzy.  Have swelling in your ankles.  Have trouble with  your vision. Get help right away if you:  Develop a severe headache or confusion.  Have unusual weakness or numbness.  Feel faint.  Have severe pain in your chest or abdomen.  Vomit repeatedly.  Have trouble breathing. Summary  Hypertension is when the force of blood pumping through your arteries is too strong. If this condition is not controlled, it may put you at risk for serious complications.  Your personal target blood pressure may vary depending on your medical conditions, your age, and other factors. For most people, a normal blood pressure is less than 120/80.  Hypertension is treated with lifestyle changes, medicines, or a combination of both. Lifestyle changes include losing weight, eating a healthy, low-sodium diet, exercising more, and limiting alcohol. This information is not intended to replace advice given to you by your health care provider. Make sure you discuss any questions you have with your health care provider. Document Revised: 03/24/2018 Document Reviewed: 03/24/2018 Elsevier Patient Education  [place].  Diabetes Mellitus and Nutrition, Adult When you have diabetes (diabetes mellitus), it is very important to have healthy eating habits because your blood sugar (glucose)  levels are greatly affected by what you eat and drink. Eating healthy foods in the appropriate amounts, at about the same times every day, can help you:  Control your blood glucose.  Lower your risk of heart disease.  Improve your blood pressure.  Reach or maintain a healthy weight. Every person with diabetes is different, and each person has different needs for a meal plan. Your health care provider may recommend that you work with a diet and nutrition specialist (dietitian) to make a meal plan that is best for you. Your meal plan may vary depending on factors such as:  The calories you need.  The medicines you take.  Your weight.  Your blood glucose, blood pressure, and cholesterol levels.  Your activity level.  Other health conditions you have, such as heart or kidney disease. How do carbohydrates affect me? Carbohydrates, also called carbs, affect your blood glucose level more than any other type of food. Eating carbs naturally raises the amount of glucose in your blood. Carb counting is a method for keeping track of how many carbs you eat. Counting carbs is important to keep your blood glucose at a healthy level, especially if you use insulin or take certain oral diabetes medicines. It is important to know how many carbs you can safely have in each meal. This is different for every person. Your dietitian can help you calculate how many carbs you should have at each meal and for each snack. Foods that contain carbs include:  Bread, cereal, rice, pasta, and crackers.  Potatoes and corn.  Peas, beans, and lentils.  Milk and yogurt.  Fruit and juice.  Desserts, such as cakes, cookies, ice cream, and candy. How does alcohol affect me? Alcohol can cause a sudden decrease in blood glucose (hypoglycemia), especially if you use insulin or take certain oral diabetes medicines. Hypoglycemia can be a life-threatening condition. Symptoms of hypoglycemia (sleepiness, dizziness, and  confusion) are similar to symptoms of having too much alcohol. If your health care provider says that alcohol is safe for you, follow these guidelines:  Limit alcohol intake to no more than 1 drink per day for nonpregnant women and 2 drinks per day for men. One drink equals 12 oz of beer, 5 oz of wine, or 1 oz of hard liquor.  Do not drink on an empty stomach.  Keep  yourself hydrated with water, diet soda, or unsweetened iced tea.  Keep in mind that regular soda, juice, and other mixers may contain a lot of sugar and must be counted as carbs. What are tips for following this plan?  Reading food labels  Start by checking the serving size on the "Nutrition Facts" label of packaged foods and drinks. The amount of calories, carbs, fats, and other nutrients listed on the label is based on one serving of the item. Many items contain more than one serving per package.  Check the total grams (g) of carbs in one serving. You can calculate the number of servings of carbs in one serving by dividing the total carbs by 15. For example, if a food has 30 g of total carbs, it would be equal to 2 servings of carbs.  Check the number of grams (g) of saturated and trans fats in one serving. Choose foods that have low or no amount of these fats.  Check the number of milligrams (mg) of salt (sodium) in one serving. Most people should limit total sodium intake to less than 2,300 mg per day.  Always check the nutrition information of foods labeled as "low-fat" or "nonfat". These foods may be higher in added sugar or refined carbs and should be avoided.  Talk to your dietitian to identify your daily goals for nutrients listed on the label. Shopping  Avoid buying canned, premade, or processed foods. These foods tend to be high in fat, sodium, and added sugar.  Shop around the outside edge of the grocery store. This includes fresh fruits and vegetables, bulk grains, fresh meats, and fresh dairy. Cooking  Use  low-heat cooking methods, such as baking, instead of high-heat cooking methods like deep frying.  Cook using healthy oils, such as olive, canola, or sunflower oil.  Avoid cooking with butter, cream, or high-fat meats. Meal planning  Eat meals and snacks regularly, preferably at the same times every day. Avoid going long periods of time without eating.  Eat foods high in fiber, such as fresh fruits, vegetables, beans, and whole grains. Talk to your dietitian about how many servings of carbs you can eat at each meal.  Eat 4-6 ounces (oz) of lean protein each day, such as lean meat, chicken, fish, eggs, or tofu. One oz of lean protein is equal to: ? 1 oz of meat, chicken, or fish. ? 1 egg. ?  cup of tofu.  Eat some foods each day that contain healthy fats, such as avocado, nuts, seeds, and fish. Lifestyle  Check your blood glucose regularly.  Exercise regularly as told by your health care provider. This may include: ? 150 minutes of moderate-intensity or vigorous-intensity exercise each week. This could be brisk walking, biking, or water aerobics. ? Stretching and doing strength exercises, such as yoga or weightlifting, at least 2 times a week.  Take medicines as told by your health care provider.  Do not use any products that contain nicotine or tobacco, such as cigarettes and e-cigarettes. If you need help quitting, ask your health care provider.  Work with a Social worker or diabetes educator to identify strategies to manage stress and any emotional and social challenges. Questions to ask a health care provider  Do I need to meet with a diabetes educator?  Do I need to meet with a dietitian?  What number can I call if I have questions?  When are the best times to check my blood glucose? Where to find more information:  American Diabetes Association: diabetes.org  Academy of Nutrition and Dietetics: www.eatright.[employer] of Diabetes and Digestive and Kidney  Diseases (NIH): [employer] Summary  A healthy meal plan will help you control your blood glucose and maintain a healthy lifestyle.  Working with a diet and nutrition specialist (dietitian) can help you make a meal plan that is best for you.  Keep in mind that carbohydrates (carbs) and alcohol have immediate effects on your blood glucose levels. It is important to count carbs and to use alcohol carefully. This information is not intended to replace advice given to you by your health care provider. Make sure you discuss any questions you have with your health care provider. Document Revised: 06/26/2017 Document Reviewed: 08/18/2016 Elsevier Patient Education  2020 Elsevier Inc.      Agustina Caroli, MD [facility]

## 2019-12-30 LAB — CMP14+EGFR
ALT: 32 IU/L (ref 0–44)
AST: 27 IU/L (ref 0–40)
Albumin/Globulin Ratio: 1.5 (ref 1.2–2.2)
Albumin: 4.1 g/dL (ref 3.8–4.8)
Alkaline Phosphatase: 95 IU/L (ref 48–121)
BUN/Creatinine Ratio: 21 (ref 10–24)
BUN: 20 mg/dL (ref 8–27)
Bilirubin Total: 0.4 mg/dL (ref 0.0–1.2)
CO2: 23 mmol/L (ref 20–29)
Calcium: 9.1 mg/dL (ref 8.6–10.2)
Chloride: 104 mmol/L (ref 96–106)
Creatinine, Ser: 0.97 mg/dL (ref 0.76–1.27)
GFR calc Af Amer: 94 mL/min/{1.73_m2} (ref >59)
GFR calc non Af Amer: 82 mL/min/{1.73_m2} (ref >59)
Globulin, Total: 2.8 g/dL (ref 1.5–4.5)
Glucose: 184 mg/dL — ABNORMAL HIGH (ref 65–99)
Potassium: 3.8 mmol/L (ref 3.5–5.2)
Sodium: 143 mmol/L (ref 134–144)
Total Protein: 6.9 g/dL (ref 6.0–8.5)

## 2019-12-30 LAB — CBC WITH DIFFERENTIAL/PLATELET
Basophils Absolute: 0.1 10*3/uL (ref 0.0–0.2)
Basos: 1 %
EOS (ABSOLUTE): 0.2 10*3/uL (ref 0.0–0.4)
Eos: 3 %
Hematocrit: 42.8 % (ref 37.5–51.0)
Hemoglobin: 14.6 g/dL (ref 13.0–17.7)
Immature Grans (Abs): 0 10*3/uL (ref 0.0–0.1)
Immature Granulocytes: 0 %
Lymphocytes Absolute: 1.8 10*3/uL (ref 0.7–3.1)
Lymphs: 23 %
MCH: 29.1 pg (ref 26.6–33.0)
MCHC: 34.1 g/dL (ref 31.5–35.7)
MCV: 85 fL (ref 79–97)
Monocytes Absolute: 0.6 10*3/uL (ref 0.1–0.9)
Monocytes: 7 %
Neutrophils Absolute: 5.2 10*3/uL (ref 1.4–7.0)
Neutrophils: 66 %
Platelets: 274 10*3/uL (ref 150–450)
RBC: 5.02 x10E6/uL (ref 4.14–5.80)
RDW: 15.6 % — ABNORMAL HIGH (ref 11.6–15.4)
WBC: 7.9 10*3/uL (ref 3.4–10.8)

## 2019-12-30 LAB — HEPATITIS C ANTIBODY: Hep C Virus Ab: <0.1 s/co ratio (ref 0.0–0.9)

## 2020-01-04 DIAGNOSIS — Z1211 Encounter for screening for malignant neoplasm of colon: Secondary | ICD-10-CM | POA: Diagnosis not present

## 2020-01-10 ENCOUNTER — Other Ambulatory Visit: Payer: Self-pay

## 2020-01-10 ENCOUNTER — Telehealth: Payer: Self-pay

## 2020-01-10 DIAGNOSIS — Z1211 Encounter for screening for malignant neoplasm of colon: Secondary | ICD-10-CM

## 2020-01-10 LAB — COLOGUARD: Cologuard: POSITIVE — AB

## 2020-01-10 NOTE — Telephone Encounter (Signed)
-----   Message from [facility] Sap[facility], MD sent at 01/10/2020 10:50 AM EDT ----- Call patient please. Positive Cologuard results. Needs referral to GI for colonoscopy. Thanks.

## 2020-01-10 NOTE — Progress Notes (Signed)
Pt has been informed of lab result and referral to GI has been placed.

## 2020-01-10 NOTE — Telephone Encounter (Signed)
Pt has been informed of lab results and referral to GI has been placed.

## 2020-01-10 NOTE — Progress Notes (Signed)
Call patient please. Positive Cologuard results. Needs referral to GI for colonoscopy. Thanks.

## 2020-01-11 ENCOUNTER — Encounter: Payer: Self-pay | Admitting: Gastroenterology

## 2020-01-24 ENCOUNTER — Other Ambulatory Visit: Payer: Self-pay

## 2020-01-24 ENCOUNTER — Encounter (INDEPENDENT_AMBULATORY_CARE_PROVIDER_SITE_OTHER): Payer: Self-pay | Admitting: Ophthalmology

## 2020-01-24 ENCOUNTER — Ambulatory Visit (INDEPENDENT_AMBULATORY_CARE_PROVIDER_SITE_OTHER): Payer: Medicare Other | Admitting: Ophthalmology

## 2020-01-24 DIAGNOSIS — E119 Type 2 diabetes mellitus without complications: Secondary | ICD-10-CM | POA: Diagnosis not present

## 2020-01-24 DIAGNOSIS — H2513 Age-related nuclear cataract, bilateral: Secondary | ICD-10-CM | POA: Diagnosis not present

## 2020-01-24 DIAGNOSIS — R6889 Other general symptoms and signs: Secondary | ICD-10-CM | POA: Diagnosis not present

## 2020-01-24 NOTE — Assessment & Plan Note (Signed)
The nature of diabetic retinopathy was explained using the following analogy: "Retinopathy develops in the body's blood supply like salty water corrodes the linings of pipes in a house, until rust appears, then holes in the pipes develop which leak followed by destruction and loss of the pipes as the corrosion turns them to dust. In a similar fashion, Diabetes damages the blood supply of the body by cumulative long--term elevated blood sugar, which corrodes the blood supply in the body, particularly the blood vessels supplying the retina, kidneys, and nerves".  Thus, control of blood sugar, slows the progression of the corrosive effect of diabetes mellitus.    

## 2020-01-24 NOTE — Progress Notes (Signed)
01/24/2020     CHIEF COMPLAINT Patient presents for Retina Evaluation   HISTORY OF PRESENT ILLNESS: Darius Ingram is a 64 y.o. male who presents to the clinic today for:   HPI    Retina Evaluation    In both eyes.  Associated Symptoms Negative for Flashes and Floaters.  Context:  distance vision.  Treatments tried include no treatments.          Comments    DM2 exam - referred by [place] Patient states that he hasn't had an eye exam in 40+ years. Patient states he is currently not having any problems. A1C 9.9       Last edited by Gerda Diss on 01/24/2020  2:20 PM. (History)      Referring physician: Horald Pollen, MD [place],  [place] [address]  HISTORICAL INFORMATION:   Selected notes from the MEDICAL RECORD NUMBER    Lab Results  Component Value Date   HGBA1C 9.9 (A) 12/29/2019     CURRENT MEDICATIONS: No current outpatient medications on file. (Ophthalmic Drugs)   No current facility-administered medications for this visit. (Ophthalmic Drugs)   Current Outpatient Medications (Other)  Medication Sig  . acetaminophen (TYLENOL) 325 MG tablet Take 650 mg by mouth every 6 (six) hours as needed.  Marland Kitchen amLODipine (NORVASC) 10 MG tablet Take 1 tablet (10 mg total) by mouth daily.  Marland Kitchen aspirin 81 MG EC tablet TAKE 1 TABLET BY MOUTH EVERY DAY  . atorvastatin (LIPITOR) 40 MG tablet Take 1 tablet (40 mg total) by mouth daily.  . cetirizine (ZYRTEC) 10 MG tablet Take 1 tablet (10 mg total) by mouth daily.  . Dulaglutide (TRULICITY) 3.29 JM/4.2AS SOPN Inject 0.75 mg into the skin once a week.  . fluticasone (FLONASE) 50 MCG/ACT nasal spray Place 2 sprays into both nostrils daily.  Marland Kitchen lisinopril (ZESTRIL) 20 MG tablet Take 1 tablet (20 mg total) by mouth daily.  . metFORMIN (GLUCOPHAGE) 1000 MG tablet Take 1 tablet (1,000 mg total) by mouth 2 (two) times daily with a meal.   No current facility-administered medications for this visit. (Other)       REVIEW OF SYSTEMS:    ALLERGIES Allergies  Allergen Reactions  . Penicillins Rash    PAST MEDICAL HISTORY Past Medical History:  Diagnosis Date  . Allergy   . Diabetes mellitus without complication ([place])   . Hypertension    History reviewed. No pertinent surgical history.  FAMILY HISTORY History reviewed. No pertinent family history.  SOCIAL HISTORY Social History   Tobacco Use  . Smoking status: Former Smoker    Packs/day: 1.00    Years: 10.00    Pack years: 10.00    Quit date: 12/29/1998    Years since quitting: 21.0  . Smokeless tobacco: Never Used  Substance Use Topics  . Alcohol use: Yes  . Drug use: No         OPHTHALMIC EXAM:  Base Eye Exam    Visual Acuity (Snellen - Linear)      Right Left   Dist [place] 20/20-1 20/25       Tonometry (Tonopen, 2:20 PM)      Right Left   Pressure 14 15       Pupils      Pupils Dark Light Shape React APD   Right PERRL 4 3 Round Slow None   Left PERRL 4 3 Round Slow None       Visual Fields (Counting fingers)  Left Right    Full Full       Extraocular Movement      Right Left    Full Full       Neuro/Psych    Oriented x3: Yes   Mood/Affect: Normal       Dilation    Both eyes: 1.0% Mydriacyl, 2.5% Phenylephrine @ 2:20 PM        Slit Lamp and Fundus Exam    External Exam      Right Left   External Normal Normal       Slit Lamp Exam      Right Left   Lids/Lashes Normal Normal   Conjunctiva/Sclera White and quiet White and quiet   Cornea Clear Clear   Anterior Chamber Deep and quiet Deep and quiet   Iris Round and reactive Round and reactive   Lens 1+ Nuclear sclerosis 1+ Nuclear sclerosis   Anterior Vitreous Normal Normal       Fundus Exam      Right Left   Posterior Vitreous Posterior vitreous detachment Posterior vitreous detachment   Disc Normal Normal   C/D Ratio 0.25 0.25   Macula Normal Normal   Vessels no DR no DR   Periphery Normal Normal          IMAGING  AND PROCEDURES  Imaging and Procedures for 01/24/20  OCT, Retina - OU - Both Eyes       Right Eye Quality was good. Scan locations included subfoveal. Central Foveal Thickness: 273. Progression has no prior data. Findings include normal foveal contour.   Left Eye Quality was good. Scan locations included subfoveal. Central Foveal Thickness: 266. Progression has no prior data. Findings include normal foveal contour.        Color Fundus Photography Optos - OU - Both Eyes       Right Eye Progression has no prior data. Disc findings include normal observations. Macula : normal observations. Vessels : normal observations. Periphery : normal observations.   Left Eye Progression has no prior data. Disc findings include normal observations. Macula : normal observations. Vessels : normal observations. Periphery : normal observations.   Notes NO DR OU                ASSESSMENT/PLAN:  Diabetes mellitus without complication (HCC) The nature of diabetic retinopathy was explained using the following analogy: "Retinopathy develops in the body's blood supply like salty water corrodes the linings of pipes in a house, until rust appears, then holes in the pipes develop which leak followed by destruction and loss of the pipes as the corrosion turns them to dust. In a similar fashion, Diabetes damages the blood supply of the body by cumulative long--term elevated blood sugar, which corrodes the blood supply in the body, particularly the blood vessels supplying the retina, kidneys, and nerves".  Thus, control of blood sugar, slows the progression of the corrosive effect of diabetes mellitus.         ICD-10-CM   1. Diabetes mellitus without complication (HCC)  T73.2 OCT, Retina - OU - Both Eyes    Color Fundus Photography Optos - OU - Both Eyes  2. Nuclear sclerotic cataract of both eyes  H25.13     1.  2.  3.  Ophthalmic Meds Ordered this visit:  No orders of the defined types  were placed in this encounter.      Return Patient needs referral for general eye care, will recommend [facility].  There are no  Patient Instructions on file for this visit.   Explained the diagnoses, plan, and follow up with the patient and they expressed understanding.  Patient expressed understanding of the importance of proper follow up care.   Clent Demark Claris Guymon M.D. Diseases & Surgery of the Retina and Vitreous Retina & Diabetic [place] 01/24/20     Abbreviations: M myopia (nearsighted); A astigmatism; H hyperopia (farsighted); P presbyopia; Mrx spectacle prescription;  CTL contact lenses; OD right eye; OS left eye; OU both eyes  XT exotropia; ET esotropia; PEK punctate epithelial keratitis; PEE punctate epithelial erosions; DES dry eye syndrome; MGD meibomian gland dysfunction; ATs artificial tears; PFAT's preservative free artificial tears; [place] nuclear sclerotic cataract; PSC posterior subcapsular cataract; ERM epi-retinal membrane; PVD posterior vitreous detachment; RD retinal detachment; DM diabetes mellitus; DR diabetic retinopathy; NPDR non-proliferative diabetic retinopathy; PDR proliferative diabetic retinopathy; CSME clinically significant macular edema; DME diabetic macular edema; dbh dot blot hemorrhages; CWS cotton wool spot; POAG primary open angle glaucoma; C/D cup-to-disc ratio; HVF humphrey visual field; GVF goldmann visual field; OCT optical coherence tomography; IOP intraocular pressure; BRVO Branch retinal vein occlusion; CRVO central retinal vein occlusion; CRAO central retinal artery occlusion; BRAO branch retinal artery occlusion; RT retinal tear; SB scleral buckle; PPV pars plana vitrectomy; VH Vitreous hemorrhage; PRP panretinal laser photocoagulation; IVK intravitreal kenalog; VMT vitreomacular traction; MH Macular hole;  NVD neovascularization of the disc; NVE neovascularization elsewhere; AREDS age related eye disease study; ARMD age related macular  degeneration; POAG primary open angle glaucoma; EBMD epithelial/anterior basement membrane dystrophy; ACIOL anterior chamber intraocular lens; IOL intraocular lens; PCIOL posterior chamber intraocular lens; Phaco/IOL phacoemulsification with intraocular lens placement; [place] photorefractive keratectomy; LASIK laser assisted in situ keratomileusis; HTN hypertension; DM diabetes mellitus; COPD chronic obstructive pulmonary disease

## 2020-02-23 ENCOUNTER — Other Ambulatory Visit: Payer: Self-pay

## 2020-02-23 ENCOUNTER — Ambulatory Visit (AMBULATORY_SURGERY_CENTER): Payer: Self-pay

## 2020-02-23 ENCOUNTER — Encounter: Payer: Self-pay | Admitting: Gastroenterology

## 2020-02-23 VITALS — Ht 72.5 in | Wt 347.8 lb

## 2020-02-23 DIAGNOSIS — Z1211 Encounter for screening for malignant neoplasm of colon: Secondary | ICD-10-CM

## 2020-02-23 NOTE — Progress Notes (Signed)
No egg or soy allergy known to patient  No issues with past sedation with any surgeries or procedures No intubation problems in the past  No diet pills per patient No home 02 use per patient  No blood thinners per patient  Pt denies issues with constipation - No A fib or A flutter  EMMI video to pt   COVID 19 guidelines implemented in [place] today   COVID vaccines completed on 10/2019 per pt;  Due to the COVID-19 pandemic we are asking patients to follow these guidelines. Please only bring one care partner. Please be aware that your care partner may wait in the car in the parking lot or if they feel like they will be too hot to wait in the car, they may wait in the lobby on the 4th floor. All care partners are required to wear a mask the entire time (we do not have any that we can provide them), they need to practice social distancing, and we will do a Covid check for all patient's and care partners when you arrive. Also we will check their temperature and your temperature. If the care partner waits in their car they need to stay in the parking lot the entire time and we will call them on their cell phone when the patient is ready for discharge so they can bring the car to the front of the building. Also all patient's will need to wear a mask into building.

## 2020-03-08 ENCOUNTER — Encounter: Payer: Self-pay | Admitting: Gastroenterology

## 2020-03-08 ENCOUNTER — Other Ambulatory Visit: Payer: Self-pay

## 2020-03-08 ENCOUNTER — Ambulatory Visit (AMBULATORY_SURGERY_CENTER): Payer: Medicare Other | Admitting: Gastroenterology

## 2020-03-08 VITALS — BP 118/77 | HR 93 | Temp 97.1°F | Resp 19 | Ht 72.0 in | Wt 347.0 lb

## 2020-03-08 DIAGNOSIS — Z1211 Encounter for screening for malignant neoplasm of colon: Secondary | ICD-10-CM

## 2020-03-08 DIAGNOSIS — D128 Benign neoplasm of rectum: Secondary | ICD-10-CM | POA: Diagnosis not present

## 2020-03-08 MED ORDER — SODIUM CHLORIDE 0.9 % IV SOLN
500.0000 mL | Freq: Once | INTRAVENOUS | Status: DC
Start: 2020-03-08 — End: 2020-03-08

## 2020-03-08 NOTE — Progress Notes (Signed)
Vital signs checked by:BC  The patient states no changes in medical or surgical history since pre-visit screening on 02/23/20.

## 2020-03-08 NOTE — Patient Instructions (Signed)
Handouts given for polyps and diverticulosis. Await pathology report.  YOU HAD AN ENDOSCOPIC PROCEDURE TODAY AT [facility]:   Refer to the procedure report that was given to you for any specific questions about what was found during the examination.  If the procedure report does not answer your questions, please call your gastroenterologist to clarify.  If you requested that your care partner not be given the details of your procedure findings, then the procedure report has been included in a sealed envelope for you to review at your convenience later.  YOU SHOULD EXPECT: Some feelings of bloating in the abdomen. Passage of more gas than usual.  Walking can help get rid of the air that was put into your GI tract during the procedure and reduce the bloating. If you had a lower endoscopy (such as a colonoscopy or flexible sigmoidoscopy) you may notice spotting of blood in your stool or on the toilet paper. If you underwent a bowel prep for your procedure, you may not have a normal bowel movement for a few days.  Please Note:  You might notice some irritation and congestion in your nose or some drainage.  This is from the oxygen used during your procedure.  There is no need for concern and it should clear up in a day or so.  SYMPTOMS TO REPORT IMMEDIATELY:   Following lower endoscopy (colonoscopy or flexible sigmoidoscopy):  Excessive amounts of blood in the stool  Significant tenderness or worsening of abdominal pains  Swelling of the abdomen that is new, acute  Fever of 100F or higher  For urgent or emergent issues, a gastroenterologist can be reached at any hour by calling [phone]. Do not use MyChart messaging for urgent concerns.    DIET:  We do recommend a small meal at first, but then you may proceed to your regular diet.  Drink plenty of fluids but you should avoid alcoholic beverages for 24 hours.  ACTIVITY:  You should plan to take it easy for the rest of today  and you should NOT DRIVE or use heavy machinery until tomorrow (because of the sedation medicines used during the test).    FOLLOW UP: Our staff will call the number listed on your records 48-72 hours following your procedure to check on you and address any questions or concerns that you may have regarding the information given to you following your procedure. If we do not reach you, we will leave a message.  We will attempt to reach you two times.  During this call, we will ask if you have developed any symptoms of COVID 19. If you develop any symptoms (ie: fever, flu-like symptoms, shortness of breath, cough etc.) before then, please call [phone].  If you test positive for Covid 19 in the 2 weeks post procedure, please call and report this information to [place].    If any biopsies were taken you will be contacted by phone or by letter within the next 1-3 weeks.  Please call us at [phone] if you have not heard about the biopsies in 3 weeks.    SIGNATURES/CONFIDENTIALITY: You and/or your care partner have signed paperwork which will be entered into your electronic medical record.  These signatures attest to the fact that that the information above on your After Visit Summary has been reviewed and is understood.  Full responsibility of the confidentiality of this discharge information lies with you and/or your care-partner.

## 2020-03-08 NOTE — Progress Notes (Signed)
Called to room to assist during endoscopic procedure.  Patient ID and intended procedure confirmed with present staff. Received instructions for my participation in the procedure from the performing physician.  

## 2020-03-08 NOTE — Op Note (Signed)
[place] Patient Name: Darius Ingram Procedure Date: 03/08/2020 8:41 AM MRN: [id number] Endoscopist: [place]. Loletha Ingram , MD Age: 64 Referring MD:  Date of Birth: February 06, 1956 Gender: Male Account #: [id number] Procedure:                Colonoscopy Indications:              Screening for colorectal malignant neoplasm, This                            is the patient's first colonoscopy Medicines:                Monitored Anesthesia Care Procedure:                Pre-Anesthesia Assessment:                           - Prior to the procedure, a History and Physical                            was performed, and patient medications and                            allergies were reviewed. The patient's tolerance of                            previous anesthesia was also reviewed. The risks                            and benefits of the procedure and the sedation                            options and risks were discussed with the patient.                            All questions were answered, and informed consent                            was obtained. Prior Anticoagulants: The patient has                            taken no previous anticoagulant or antiplatelet                            agents except for aspirin. ASA Grade Assessment:                            III - A patient with severe systemic disease. After                            reviewing the risks and benefits, the patient was                            deemed in satisfactory condition to undergo the  procedure.                           After obtaining informed consent, the colonoscope                            was passed under direct vision. Throughout the                            procedure, the patient's blood pressure, pulse, and                            oxygen saturations were monitored continuously. The                            Colonoscope was introduced through the anus and                             advanced to the the cecum, identified by                            appendiceal orifice and ileocecal valve. The                            colonoscopy was somewhat difficult due to a                            redundant colon. Successful completion of the                            procedure was aided by using manual pressure. The                            patient tolerated the procedure well. The quality                            of the bowel preparation was fair (improved with                            copious lavage). The ileocecal valve, appendiceal                            orifice, and rectum were photographed. The bowel                            preparation used was Miralax. Scope In: 8:48:56 AM Scope Out: 9:11:30 AM Scope Withdrawal Time: 0 hours 16 minutes 13 seconds  Total Procedure Duration: 0 hours 22 minutes 34 seconds  Findings:                 The perianal and digital rectal examinations were                            normal.  Multiple small-mouthed diverticula were found in                            the sigmoid colon.                           Three sessile polyps were found in the distal                            rectum. The polyps were diminutive in size. These                            polyps were removed with a cold snare. Resection                            and retrieval were complete.                           The exam was otherwise without abnormality on                            direct and retroflexion views. Complications:            No immediate complications. Estimated Blood Loss:     Estimated blood loss was minimal. Impression:               - Preparation of the colon was fair.                           - Diverticulosis in the sigmoid colon.                           - Three diminutive polyps in the distal rectum,                            removed with a cold snare. Resected and retrieved.                            - The examination was otherwise normal on direct                            and retroflexion views. Recommendation:           - Patient has a contact number available for                            emergencies. The signs and symptoms of potential                            delayed complications were discussed with the                            patient. Return to normal activities tomorrow.                            Written discharge instructions were  provided to the                            patient.                           - Resume previous diet.                           - Continue present medications.                           - Await pathology results.                           - Repeat colonoscopy in 3 years for surveillance.                            Suprep or Plenvu for next exam. Darius Mussel L. Loletha Carrow, MD 03/08/2020 9:16:37 AM This report has been signed electronically.

## 2020-03-08 NOTE — Progress Notes (Signed)
PT taken to PACU. Monitors in place. VSS. Report given to RN. 

## 2020-03-12 ENCOUNTER — Telehealth: Payer: Self-pay | Admitting: *Deleted

## 2020-03-12 NOTE — Telephone Encounter (Signed)
  Follow up Call-  Call back number 03/08/2020  Post procedure Call Back phone  # [phone]  Permission to leave phone message Yes  Some recent data might be hidden     Patient questions:  Do you have a fever, pain , or abdominal swelling? No. Pain Score  0 *  Have you tolerated food without any problems? Yes.    Have you been able to return to your normal activities? Yes.    Do you have any questions about your discharge instructions: Diet   No. Medications  No. Follow up visit  No.  Do you have questions or concerns about your Care? No.  Actions: * If pain score is 4 or above: No action needed, pain <4.  1. Have you developed a fever since your procedure? no  2.   Have you had an respiratory symptoms (SOB or cough) since your procedure? no  3.   Have you tested positive for COVID 19 since your procedure no  4.   Have you had any family members/close contacts diagnosed with the COVID 19 since your procedure?  no   If yes to any of these questions please route to Joylene John, RN and Joella Prince, RN

## 2020-03-13 ENCOUNTER — Encounter: Payer: Self-pay | Admitting: Gastroenterology

## 2020-04-03 ENCOUNTER — Encounter: Payer: Self-pay | Admitting: Emergency Medicine

## 2020-04-03 ENCOUNTER — Other Ambulatory Visit: Payer: Self-pay

## 2020-04-03 ENCOUNTER — Ambulatory Visit (INDEPENDENT_AMBULATORY_CARE_PROVIDER_SITE_OTHER): Payer: Medicare Other | Admitting: Emergency Medicine

## 2020-04-03 VITALS — BP 148/80 | HR 101 | Temp 97.7°F | Resp 16 | Ht 72.0 in | Wt 340.0 lb

## 2020-04-03 DIAGNOSIS — E1165 Type 2 diabetes mellitus with hyperglycemia: Secondary | ICD-10-CM | POA: Diagnosis not present

## 2020-04-03 DIAGNOSIS — Z6841 Body Mass Index (BMI) 40.0 and over, adult: Secondary | ICD-10-CM

## 2020-04-03 DIAGNOSIS — I10 Essential (primary) hypertension: Secondary | ICD-10-CM

## 2020-04-03 DIAGNOSIS — E1159 Type 2 diabetes mellitus with other circulatory complications: Secondary | ICD-10-CM

## 2020-04-03 LAB — POCT GLYCOSYLATED HEMOGLOBIN (HGB A1C): Hemoglobin A1C: 6.9 % — AB (ref 4.0–5.6)

## 2020-04-03 LAB — GLUCOSE, POCT (MANUAL RESULT ENTRY): POC Glucose: 99 mg/dl (ref 70–99)

## 2020-04-03 NOTE — Patient Instructions (Addendum)
   If you have lab work done today you will be contacted with your lab results within the next 2 weeks.  If you have not heard from us then please contact us. The fastest way to get your results is to register for My Chart.   IF you received an x-ray today, you will receive an invoice from [facility]. Please contact  Radiology at [phone] with questions or concerns regarding your invoice.   IF you received labwork today, you will receive an invoice from [facility]. Please contact [facility] at [phone] with questions or concerns regarding your invoice.   Our billing staff will not be able to assist you with questions regarding bills from these companies.  You will be contacted with the lab results as soon as they are available. The fastest way to get your results is to activate your My Chart account. Instructions are located on the last page of this paperwork. If you have not heard from us regarding the results in 2 weeks, please contact this office.     Diabetes Mellitus and Nutrition, Adult When you have diabetes (diabetes mellitus), it is very important to have healthy eating habits because your blood sugar (glucose) levels are greatly affected by what you eat and drink. Eating healthy foods in the appropriate amounts, at about the same times every day, can help you:  Control your blood glucose.  Lower your risk of heart disease.  Improve your blood pressure.  Reach or maintain a healthy weight. Every person with diabetes is different, and each person has different needs for a meal plan. Your health care provider may recommend that you work with a diet and nutrition specialist (dietitian) to make a meal plan that is best for you. Your meal plan may vary depending on factors such as:  The calories you need.  The medicines you take.  Your weight.  Your blood glucose, blood pressure, and cholesterol levels.  Your activity level.  Other health conditions  you have, such as heart or kidney disease. How do carbohydrates affect me? Carbohydrates, also called carbs, affect your blood glucose level more than any other type of food. Eating carbs naturally raises the amount of glucose in your blood. Carb counting is a method for keeping track of how many carbs you eat. Counting carbs is important to keep your blood glucose at a healthy level, especially if you use insulin or take certain oral diabetes medicines. It is important to know how many carbs you can safely have in each meal. This is different for every person. Your dietitian can help you calculate how many carbs you should have at each meal and for each snack. Foods that contain carbs include:  Bread, cereal, rice, pasta, and crackers.  Potatoes and corn.  Peas, beans, and lentils.  Milk and yogurt.  Fruit and juice.  Desserts, such as cakes, cookies, ice cream, and candy. How does alcohol affect me? Alcohol can cause a sudden decrease in blood glucose (hypoglycemia), especially if you use insulin or take certain oral diabetes medicines. Hypoglycemia can be a life-threatening condition. Symptoms of hypoglycemia (sleepiness, dizziness, and confusion) are similar to symptoms of having too much alcohol. If your health care provider says that alcohol is safe for you, follow these guidelines:  Limit alcohol intake to no more than 1 drink per day for nonpregnant women and 2 drinks per day for men. One drink equals 12 oz of beer, 5 oz of wine, or 1 oz of hard liquor.    Do not drink on an empty stomach.  Keep yourself hydrated with water, diet soda, or unsweetened iced tea.  Keep in mind that regular soda, juice, and other mixers may contain a lot of sugar and must be counted as carbs. What are tips for following this plan?  Reading food labels  Start by checking the serving size on the "Nutrition Facts" label of packaged foods and drinks. The amount of calories, carbs, fats, and other  nutrients listed on the label is based on one serving of the item. Many items contain more than one serving per package.  Check the total grams (g) of carbs in one serving. You can calculate the number of servings of carbs in one serving by dividing the total carbs by 15. For example, if a food has 30 g of total carbs, it would be equal to 2 servings of carbs.  Check the number of grams (g) of saturated and trans fats in one serving. Choose foods that have low or no amount of these fats.  Check the number of milligrams (mg) of salt (sodium) in one serving. Most people should limit total sodium intake to less than 2,300 mg per day.  Always check the nutrition information of foods labeled as "low-fat" or "nonfat". These foods may be higher in added sugar or refined carbs and should be avoided.  Talk to your dietitian to identify your daily goals for nutrients listed on the label. Shopping  Avoid buying canned, premade, or processed foods. These foods tend to be high in fat, sodium, and added sugar.  Shop around the outside edge of the grocery store. This includes fresh fruits and vegetables, bulk grains, fresh meats, and fresh dairy. Cooking  Use low-heat cooking methods, such as baking, instead of high-heat cooking methods like deep frying.  Cook using healthy oils, such as olive, canola, or sunflower oil.  Avoid cooking with butter, cream, or high-fat meats. Meal planning  Eat meals and snacks regularly, preferably at the same times every day. Avoid going long periods of time without eating.  Eat foods high in fiber, such as fresh fruits, vegetables, beans, and whole grains. Talk to your dietitian about how many servings of carbs you can eat at each meal.  Eat 4-6 ounces (oz) of lean protein each day, such as lean meat, chicken, fish, eggs, or tofu. One oz of lean protein is equal to: ? 1 oz of meat, chicken, or fish. ? 1 egg. ?  cup of tofu.  Eat some foods each day that contain  healthy fats, such as avocado, nuts, seeds, and fish. Lifestyle  Check your blood glucose regularly.  Exercise regularly as told by your health care provider. This may include: ? 150 minutes of moderate-intensity or vigorous-intensity exercise each week. This could be brisk walking, biking, or water aerobics. ? Stretching and doing strength exercises, such as yoga or weightlifting, at least 2 times a week.  Take medicines as told by your health care provider.  Do not use any products that contain nicotine or tobacco, such as cigarettes and e-cigarettes. If you need help quitting, ask your health care provider.  Work with a counselor or diabetes educator to identify strategies to manage stress and any emotional and social challenges. Questions to ask a health care provider  Do I need to meet with a diabetes educator?  Do I need to meet with a dietitian?  What number can I call if I have questions?  When are the best times to   times to check my blood glucose? Where to find more information:  American Diabetes Association: diabetes.org  Academy of Nutrition and Dietetics: www.eatright.org  National Institute of Diabetes and Digestive and Kidney Diseases (NIH): www.niddk.nih.gov Summary  A healthy meal plan will help you control your blood glucose and maintain a healthy lifestyle.  Working with a diet and nutrition specialist (dietitian) can help you make a meal plan that is best for you.  Keep in mind that carbohydrates (carbs) and alcohol have immediate effects on your blood glucose levels. It is important to count carbs and to use alcohol carefully. This information is not intended to replace advice given to you by your health care provider. Make sure you discuss any questions you have with your health care provider. Document Revised: 06/26/2017 Document Reviewed: 08/18/2016 [employer] Patient Education  2020 [employer]  

## 2020-04-03 NOTE — Progress Notes (Signed)
Darius Ingram 64 y.o.   Chief Complaint  Patient presents with  . Diabetes    follow up 3 month  Office visit 12/29/2019 ASSESSMENT & PLAN: Hypertension associated with diabetes ([place]) Uncontrolled hypertension.  Start amlodipine 10 mg daily and lisinopril 20 mg daily. Uncontrolled diabetes with hemoglobin A1c of 9.9. Start Metformin 1000 mg twice a day and Trulicity 0.34 mg weekly. Diet and nutrition discussed. Follow-up in 3 months HISTORY OF PRESENT ILLNESS: This is a 64 y.o. male with history of diabetes and hypertension here for 75-month follow-up.  Doing well has no complaints. #1 diabetes: On Metformin 1000 mg twice a day and Trulicity once weekly 7.42 mg. #2 hypertension: On amlodipine 10 mg daily and lisinopril 20 mg daily. #3 dyslipidemia: On atorvastatin 40 mg daily. Fully vaccinated against Covid. Eating better, exercising more, and losing some weight. Lab Results  Component Value Date   HGBA1C 9.9 (A) 12/29/2019   BP Readings from Last 3 Encounters:  04/03/20 (!) 148/80  03/08/20 118/77  12/29/19 (!) 164/105   The 10-year ASCVD risk score Mikey Bussing DC Jr., et al., 2013) is: 33.3%   Values used to calculate the score:     Age: 24 years     Sex: Male     Is Non-Hispanic African American: No     Diabetic: Yes     Tobacco smoker: No     Systolic Blood Pressure: 595 mmHg     Is BP treated: Yes     HDL Cholesterol: 37 mg/dL     Total Cholesterol: 158 mg/dL Wt Readings from Last 3 Encounters:  04/03/20 (!) 340 lb (154.2 kg)  03/08/20 (!) 347 lb (157.4 kg)  02/23/20 (!) 347 lb 12.8 oz (157.8 kg)   Lab Results  Component Value Date   CREATININE 0.97 12/29/2019   BUN 20 12/29/2019   NA 143 12/29/2019   K 3.8 12/29/2019   CL 104 12/29/2019   CO2 23 12/29/2019    HPI   Prior to Admission medications   Medication Sig Start Date End Date Taking? Authorizing Provider  acetaminophen (TYLENOL) 325 MG tablet Take 650 mg by mouth every 6 (six) hours as needed.    Yes [provider]  amLODipine (NORVASC) 10 MG tablet Take 1 tablet (10 mg total) by mouth daily. 12/29/19  Yes Horald Pollen, MD  aspirin 81 MG EC tablet TAKE 1 TABLET BY MOUTH EVERY DAY 04/16/18  Yes Jaynee Eagles, PA-C  atorvastatin (LIPITOR) 40 MG tablet Take 1 tablet (40 mg total) by mouth daily. 12/29/19  Yes Tasneem Cormier, Ines Bloomer, MD  cetirizine (ZYRTEC) 10 MG tablet Take 1 tablet (10 mg total) by mouth daily. 01/21/18  Yes Jaynee Eagles, PA-C  Dulaglutide (TRULICITY) 6.38 VF/6.4PP SOPN Inject 0.75 mg into the skin once a week. 12/29/19  Yes Pebbles Zeiders, Ines Bloomer, MD  fluticasone [facility]) 50 MCG/ACT nasal spray Place 2 sprays into both nostrils daily. 06/21/18  Yes Dajha Urquilla, Ines Bloomer, MD  lisinopril (ZESTRIL) 20 MG tablet Take 1 tablet (20 mg total) by mouth daily. 12/29/19  Yes Hildreth Orsak, Ines Bloomer, MD  Multiple Vitamin (MULTIVITAMIN PO) Take 1 tablet by mouth daily.   Yes [provider]  metFORMIN (GLUCOPHAGE) 1000 MG tablet Take 1 tablet (1,000 mg total) by mouth 2 (two) times daily with a meal. 12/29/19 03/28/20  Horald Pollen, MD    Allergies  Allergen Reactions  . Penicillins Rash    Patient Active Problem List   Diagnosis Date Noted  . Diabetes  mellitus without complication ([place]) 37/85/8850  . Nuclear sclerotic cataract of both eyes 01/24/2020  . Uncontrolled type 2 diabetes mellitus with hyperglycemia ([place]) 12/29/2019  . Hypertension associated with diabetes ([place]) 12/29/2019  . Body mass index (BMI) of 45.0-49.9 in adult [facility]) 12/29/2019    Past Medical History:  Diagnosis Date  . Allergy    seasonal allergies  . Diabetes mellitus without complication ([place])   . Hyperlipidemia   . Hypertension     Past Surgical History:  Procedure Laterality Date  . TONSILECTOMY, ADENOIDECTOMY, BILATERAL MYRINGOTOMY AND TUBES  1960  . TYMPANOSTOMY  1960  . WISDOM TOOTH EXTRACTION  1978    Social History   Socioeconomic History  . Marital status: Single     Spouse name: Not on file  . Number of children: Not on file  . Years of education: Not on file  . Highest education level: Not on file  Occupational History  . Not on file  Tobacco Use  . Smoking status: Former Smoker    Packs/day: 1.00    Years: 10.00    Pack years: 10.00    Quit date: 12/29/1998    Years since quitting: 21.2  . Smokeless tobacco: Never Used  Vaping Use  . Vaping Use: Never used  Substance and Sexual Activity  . Alcohol use: Yes    Comment: occ  . Drug use: No  . Sexual activity: Never  Other Topics Concern  . Not on file  Social History Narrative  . Not on file   Social Determinants of Health   Financial Resource Strain:   . Difficulty of Paying Living Expenses: Not on file  Food Insecurity:   . Worried About Charity fundraiser in the Last Year: Not on file  . Ran Out of Food in the Last Year: Not on file  Transportation Needs:   . Lack of Transportation (Medical): Not on file  . Lack of Transportation (Non-Medical): Not on file  Physical Activity:   . Days of Exercise per Week: Not on file  . Minutes of Exercise per Session: Not on file  Stress:   . Feeling of Stress : Not on file  Social Connections:   . Frequency of Communication with Friends and Family: Not on file  . Frequency of Social Gatherings with Friends and Family: Not on file  . Attends Religious Services: Not on file  . Active Member of Clubs or Organizations: Not on file  . Attends Archivist Meetings: Not on file  . Marital Status: Not on file  Intimate Partner Violence:   . Fear of Current or Ex-Partner: Not on file  . Emotionally Abused: Not on file  . Physically Abused: Not on file  . Sexually Abused: Not on file    Family History  Problem Relation Age of Onset  . Colon polyps Neg Hx   . Colon cancer Neg Hx   . Esophageal cancer Neg Hx   . Stomach cancer Neg Hx   . Rectal cancer Neg Hx      Review of Systems  Constitutional: Negative.  Negative for  chills and fever.  HENT: Negative.  Negative for congestion and sore throat.   Respiratory: Negative.  Negative for cough and shortness of breath.   Cardiovascular: Negative.  Negative for chest pain and palpitations.  Gastrointestinal: Negative.  Negative for abdominal pain, blood in stool, diarrhea, melena, nausea and vomiting.  Genitourinary: Negative.  Negative for dysuria and hematuria.  Skin: Negative.  Negative  for rash.  Neurological: Negative.  Negative for dizziness and headaches.  All other systems reviewed and are negative.  Today's Vitals   04/03/20 0932  BP: (!) 148/80  Pulse: (!) 101  Resp: 16  Temp: 97.7 F (36.5 C)  TempSrc: Temporal  SpO2: 94%  Weight: (!) 340 lb (154.2 kg)  Height: 6' (1.829 m)   Body mass index is 46.11 kg/m.   Physical Exam Vitals reviewed.  Constitutional:      Appearance: Normal appearance. He is obese.  HENT:     Head: Normocephalic.  Eyes:     Extraocular Movements: Extraocular movements intact.     Pupils: Pupils are equal, round, and reactive to light.  Cardiovascular:     Rate and Rhythm: Normal rate and regular rhythm.     Pulses: Normal pulses.     Heart sounds: Normal heart sounds.  Pulmonary:     Effort: Pulmonary effort is normal.     Breath sounds: Normal breath sounds.  Musculoskeletal:        General: Normal range of motion.     Cervical back: Normal range of motion and neck supple.     Right lower leg: No edema.     Left lower leg: No edema.  Skin:    General: Skin is warm and dry.  Neurological:     General: No focal deficit present.     Mental Status: He is alert and oriented to person, place, and time.  Psychiatric:        Mood and Affect: Mood normal.        Behavior: Behavior normal.    Results for orders placed or performed in visit on 04/03/20 (from the past 24 hour(s))  POCT glucose (manual entry)     Status: Normal   Collection Time: 04/03/20 10:13 AM  Result Value Ref Range   POC Glucose 99 70  - 99 mg/dl  POCT glycosylated hemoglobin (Hb A1C)     Status: Abnormal   Collection Time: 04/03/20 10:19 AM  Result Value Ref Range   Hemoglobin A1C 6.9 (A) 4.0 - 5.6 %   HbA1c POC (<> result, manual entry)     HbA1c, POC (prediabetic range)     HbA1c, POC (controlled diabetic range)      A total of 30 minutes was spent with the patient, greater than 50% of which was in counseling/coordination of care regarding diabetes hypertension dyslipidemia and cardiovascular risks associated with these conditions, review of all medications, review of most recent blood work results including today's hemoglobin A1c, diet and nutrition, review of most recent office visit notes, prognosis and need for follow-up in 3 months.   ASSESSMENT & PLAN: Hypertension associated with diabetes ([place]) Acceptable blood pressure numbers.  Continue present medication.  No changes. Much improved diabetes with hemoglobin A1c of 6.9.  Continue Trulicity and Metformin.  Diet and nutrition discussed. Continue statin therapy with Lipitor 40 mg daily. Follow-up in 3 months.  Koji was seen today for diabetes.  Diagnoses and all orders for this visit:  Hypertension associated with diabetes ([place]) -     CBC with Differential/Platelet -     Comprehensive metabolic panel -     Lipid panel  Uncontrolled type 2 diabetes mellitus with hyperglycemia ([facility]) -     POCT glucose (manual entry) -     POCT glycosylated hemoglobin (Hb A1C)  Body mass index (BMI) of 45.0-49.9 in adult [facility])  Morbid obesity due to excess calories ([facility])  Type 2  diabetes mellitus with other circulatory complications [facility])    Patient Instructions       If you have lab work done today you will be contacted with your lab results within the next 2 weeks.  If you have not heard from [place] then please contact us. The fastest way to get your results is to register for My Chart.   IF you received an x-ray today, you will receive an invoice from  [facility]. Please contact [facility] at [phone] with questions or concerns regarding your invoice.   IF you received labwork today, you will receive an invoice from [facility]. Please contact [facility] at [phone] with questions or concerns regarding your invoice.   Our billing staff will not be able to assist you with questions regarding bills from these companies.  You will be contacted with the lab results as soon as they are available. The fastest way to get your results is to activate your My Chart account. Instructions are located on the last page of this paperwork. If you have not heard from [place] regarding the results in 2 weeks, please contact this office.     Diabetes Mellitus and Nutrition, Adult When you have diabetes (diabetes mellitus), it is very important to have healthy eating habits because your blood sugar (glucose) levels are greatly affected by what you eat and drink. Eating healthy foods in the appropriate amounts, at about the same times every day, can help you:  Control your blood glucose.  Lower your risk of heart disease.  Improve your blood pressure.  Reach or maintain a healthy weight. Every person with diabetes is different, and each person has different needs for a meal plan. Your health care provider may recommend that you work with a diet and nutrition specialist (dietitian) to make a meal plan that is best for you. Your meal plan may vary depending on factors such as:  The calories you need.  The medicines you take.  Your weight.  Your blood glucose, blood pressure, and cholesterol levels.  Your activity level.  Other health conditions you have, such as heart or kidney disease. How do carbohydrates affect me? Carbohydrates, also called carbs, affect your blood glucose level more than any other type of food. Eating carbs naturally raises the amount of glucose in your blood. Carb counting is a method for keeping track of how  many carbs you eat. Counting carbs is important to keep your blood glucose at a healthy level, especially if you use insulin or take certain oral diabetes medicines. It is important to know how many carbs you can safely have in each meal. This is different for every person. Your dietitian can help you calculate how many carbs you should have at each meal and for each snack. Foods that contain carbs include:  Bread, cereal, rice, pasta, and crackers.  Potatoes and corn.  Peas, beans, and lentils.  Milk and yogurt.  Fruit and juice.  Desserts, such as cakes, cookies, ice cream, and candy. How does alcohol affect me? Alcohol can cause a sudden decrease in blood glucose (hypoglycemia), especially if you use insulin or take certain oral diabetes medicines. Hypoglycemia can be a life-threatening condition. Symptoms of hypoglycemia (sleepiness, dizziness, and confusion) are similar to symptoms of having too much alcohol. If your health care provider says that alcohol is safe for you, follow these guidelines:  Limit alcohol intake to no more than 1 drink per day for nonpregnant women and 2 drinks per day for men. One  drink equals 12 oz of beer, 5 oz of wine, or 1 oz of hard liquor.  Do not drink on an empty stomach.  Keep yourself hydrated with water, diet soda, or unsweetened iced tea.  Keep in mind that regular soda, juice, and other mixers may contain a lot of sugar and must be counted as carbs. What are tips for following this plan?  Reading food labels  Start by checking the serving size on the "Nutrition Facts" label of packaged foods and drinks. The amount of calories, carbs, fats, and other nutrients listed on the label is based on one serving of the item. Many items contain more than one serving per package.  Check the total grams (g) of carbs in one serving. You can calculate the number of servings of carbs in one serving by dividing the total carbs by 15. For example, if a food  has 30 g of total carbs, it would be equal to 2 servings of carbs.  Check the number of grams (g) of saturated and trans fats in one serving. Choose foods that have low or no amount of these fats.  Check the number of milligrams (mg) of salt (sodium) in one serving. Most people should limit total sodium intake to less than 2,300 mg per day.  Always check the nutrition information of foods labeled as "low-fat" or "nonfat". These foods may be higher in added sugar or refined carbs and should be avoided.  Talk to your dietitian to identify your daily goals for nutrients listed on the label. Shopping  Avoid buying canned, premade, or processed foods. These foods tend to be high in fat, sodium, and added sugar.  Shop around the outside edge of the grocery store. This includes fresh fruits and vegetables, bulk grains, fresh meats, and fresh dairy. Cooking  Use low-heat cooking methods, such as baking, instead of high-heat cooking methods like deep frying.  Cook using healthy oils, such as olive, canola, or sunflower oil.  Avoid cooking with butter, cream, or high-fat meats. Meal planning  Eat meals and snacks regularly, preferably at the same times every day. Avoid going long periods of time without eating.  Eat foods high in fiber, such as fresh fruits, vegetables, beans, and whole grains. Talk to your dietitian about how many servings of carbs you can eat at each meal.  Eat 4-6 ounces (oz) of lean protein each day, such as lean meat, chicken, fish, eggs, or tofu. One oz of lean protein is equal to: ? 1 oz of meat, chicken, or fish. ? 1 egg. ?  cup of tofu.  Eat some foods each day that contain healthy fats, such as avocado, nuts, seeds, and fish. Lifestyle  Check your blood glucose regularly.  Exercise regularly as told by your health care provider. This may include: ? 150 minutes of moderate-intensity or vigorous-intensity exercise each week. This could be brisk walking, biking,  or water aerobics. ? Stretching and doing strength exercises, such as yoga or weightlifting, at least 2 times a week.  Take medicines as told by your health care provider.  Do not use any products that contain nicotine or tobacco, such as cigarettes and e-cigarettes. If you need help quitting, ask your health care provider.  Work with a Social worker or diabetes educator to identify strategies to manage stress and any emotional and social challenges. Questions to ask a health care provider  Do I need to meet with a diabetes educator?  Do I need to meet with a dietitian?  What number can I call if I have questions?  When are the best times to check my blood glucose? Where to find more information:  American Diabetes Association: diabetes.org  Academy of Nutrition and Dietetics: www.eatright.[employer] of Diabetes and Digestive and Kidney Diseases (NIH): [employer] Summary  A healthy meal plan will help you control your blood glucose and maintain a healthy lifestyle.  Working with a diet and nutrition specialist (dietitian) can help you make a meal plan that is best for you.  Keep in mind that carbohydrates (carbs) and alcohol have immediate effects on your blood glucose levels. It is important to count carbs and to use alcohol carefully. This information is not intended to replace advice given to you by your health care provider. Make sure you discuss any questions you have with your health care provider. Document Revised: 06/26/2017 Document Reviewed: 08/18/2016 Elsevier Patient Education  2020 Elsevier Inc.     Agustina Caroli, MD [facility]

## 2020-04-03 NOTE — Assessment & Plan Note (Signed)
Acceptable blood pressure numbers.  Continue present medication.  No changes. Much improved diabetes with hemoglobin A1c of 6.9.  Continue Trulicity and Metformin.  Diet and nutrition discussed. Continue statin therapy with Lipitor 40 mg daily. Follow-up in 3 months.

## 2020-04-04 LAB — CBC WITH DIFFERENTIAL/PLATELET
Basophils Absolute: 0.1 10*3/uL (ref 0.0–0.2)
Basos: 1 %
EOS (ABSOLUTE): 0.5 10*3/uL — ABNORMAL HIGH (ref 0.0–0.4)
Eos: 4 %
Hematocrit: 40.8 % (ref 37.5–51.0)
Hemoglobin: 14.4 g/dL (ref 13.0–17.7)
Immature Grans (Abs): 0.1 10*3/uL (ref 0.0–0.1)
Immature Granulocytes: 1 %
Lymphocytes Absolute: 2.3 10*3/uL (ref 0.7–3.1)
Lymphs: 19 %
MCH: 30.5 pg (ref 26.6–33.0)
MCHC: 35.3 g/dL (ref 31.5–35.7)
MCV: 86 fL (ref 79–97)
Monocytes Absolute: 0.7 10*3/uL (ref 0.1–0.9)
Monocytes: 6 %
Neutrophils Absolute: 8.2 10*3/uL — ABNORMAL HIGH (ref 1.4–7.0)
Neutrophils: 69 %
Platelets: 257 10*3/uL (ref 150–450)
RBC: 4.72 x10E6/uL (ref 4.14–5.80)
RDW: 15.3 % (ref 11.6–15.4)
WBC: 11.7 10*3/uL — ABNORMAL HIGH (ref 3.4–10.8)

## 2020-04-04 LAB — COMPREHENSIVE METABOLIC PANEL
ALT: 16 IU/L (ref 0–44)
AST: 16 IU/L (ref 0–40)
Albumin/Globulin Ratio: 1.5 (ref 1.2–2.2)
Albumin: 4.3 g/dL (ref 3.8–4.8)
Alkaline Phosphatase: 85 IU/L (ref 48–121)
BUN/Creatinine Ratio: 17 (ref 10–24)
BUN: 16 mg/dL (ref 8–27)
Bilirubin Total: 0.5 mg/dL (ref 0.0–1.2)
CO2: 26 mmol/L (ref 20–29)
Calcium: 9.3 mg/dL (ref 8.6–10.2)
Chloride: 105 mmol/L (ref 96–106)
Creatinine, Ser: 0.92 mg/dL (ref 0.76–1.27)
GFR calc Af Amer: 101 mL/min/{1.73_m2} (ref >59)
GFR calc non Af Amer: 87 mL/min/{1.73_m2} (ref >59)
Globulin, Total: 2.8 g/dL (ref 1.5–4.5)
Glucose: 99 mg/dL (ref 65–99)
Potassium: 3.9 mmol/L (ref 3.5–5.2)
Sodium: 144 mmol/L (ref 134–144)
Total Protein: 7.1 g/dL (ref 6.0–8.5)

## 2020-04-04 LAB — LIPID PANEL
Chol/HDL Ratio: 3.5 ratio (ref 0.0–5.0)
Cholesterol, Total: 129 mg/dL (ref 100–199)
HDL: 37 mg/dL — ABNORMAL LOW (ref >39)
LDL Chol Calc (NIH): 75 mg/dL (ref 0–99)
Triglycerides: 88 mg/dL (ref 0–149)
VLDL Cholesterol Cal: 17 mg/dL (ref 5–40)

## 2020-05-15 ENCOUNTER — Telehealth: Payer: Self-pay | Admitting: Emergency Medicine

## 2020-05-15 ENCOUNTER — Other Ambulatory Visit: Payer: Self-pay

## 2020-05-15 DIAGNOSIS — E1165 Type 2 diabetes mellitus with hyperglycemia: Secondary | ICD-10-CM

## 2020-05-15 MED ORDER — TRULICITY 0.75 MG/0.5ML ~~LOC~~ SOAJ: 0.7500 mg | SUBCUTANEOUS | 2 refills | Status: DC

## 2020-05-15 NOTE — Telephone Encounter (Signed)
What is the name of the medication? Dulaglutide (TRULICITY) 3.68 ZR/9.2FC SOPN [[id number]]    Have you contacted your pharmacy to request a refill? Pt is very frustrated. He is under the impression he has 2 refills left with this script. The pharmacy is telling him know he has none left. He would like a new refill request for Trulicity.   Which pharmacy would you like this sent to? Pharmacy  [facility], [place], [place] [address]  Phone:  [phone] Fax:  [phone]  DEA #:  --      Patient notified that their request is being sent to the clinical staff for review and that they should receive a call once it is complete. If they do not receive a call within 72 hours they can check with their pharmacy or our office.

## 2020-07-04 ENCOUNTER — Other Ambulatory Visit: Payer: Self-pay

## 2020-07-04 ENCOUNTER — Encounter: Payer: Self-pay | Admitting: Emergency Medicine

## 2020-07-04 ENCOUNTER — Ambulatory Visit (INDEPENDENT_AMBULATORY_CARE_PROVIDER_SITE_OTHER): Payer: Medicare Other | Admitting: Emergency Medicine

## 2020-07-04 VITALS — BP 125/87 | HR 101 | Temp 97.4°F | Resp 18 | Ht 72.0 in | Wt 337.0 lb

## 2020-07-04 DIAGNOSIS — I152 Hypertension secondary to endocrine disorders: Secondary | ICD-10-CM

## 2020-07-04 DIAGNOSIS — E1159 Type 2 diabetes mellitus with other circulatory complications: Secondary | ICD-10-CM

## 2020-07-04 DIAGNOSIS — E1165 Type 2 diabetes mellitus with hyperglycemia: Secondary | ICD-10-CM | POA: Diagnosis not present

## 2020-07-04 DIAGNOSIS — Z23 Encounter for immunization: Secondary | ICD-10-CM

## 2020-07-04 DIAGNOSIS — Z6841 Body Mass Index (BMI) 40.0 and over, adult: Secondary | ICD-10-CM | POA: Diagnosis not present

## 2020-07-04 LAB — POCT GLYCOSYLATED HEMOGLOBIN (HGB A1C): Hemoglobin A1C: 6.4 % — AB (ref 4.0–5.6)

## 2020-07-04 LAB — GLUCOSE, POCT (MANUAL RESULT ENTRY): POC Glucose: 228 mg/dl — AB (ref 70–99)

## 2020-07-04 NOTE — Patient Instructions (Addendum)
   If you have lab work done today you will be contacted with your lab results within the next 2 weeks.  If you have not heard from us then please contact us. The fastest way to get your results is to register for My Chart.   IF you received an x-ray today, you will receive an invoice from [facility]. Please contact  Radiology at [phone] with questions or concerns regarding your invoice.   IF you received labwork today, you will receive an invoice from [facility]. Please contact [facility] at [phone] with questions or concerns regarding your invoice.   Our billing staff will not be able to assist you with questions regarding bills from these companies.  You will be contacted with the lab results as soon as they are available. The fastest way to get your results is to activate your My Chart account. Instructions are located on the last page of this paperwork. If you have not heard from us regarding the results in 2 weeks, please contact this office.     Diabetes Mellitus and Nutrition, Adult When you have diabetes (diabetes mellitus), it is very important to have healthy eating habits because your blood sugar (glucose) levels are greatly affected by what you eat and drink. Eating healthy foods in the appropriate amounts, at about the same times every day, can help you:  Control your blood glucose.  Lower your risk of heart disease.  Improve your blood pressure.  Reach or maintain a healthy weight. Every person with diabetes is different, and each person has different needs for a meal plan. Your health care provider may recommend that you work with a diet and nutrition specialist (dietitian) to make a meal plan that is best for you. Your meal plan may vary depending on factors such as:  The calories you need.  The medicines you take.  Your weight.  Your blood glucose, blood pressure, and cholesterol levels.  Your activity level.  Other health conditions  you have, such as heart or kidney disease. How do carbohydrates affect me? Carbohydrates, also called carbs, affect your blood glucose level more than any other type of food. Eating carbs naturally raises the amount of glucose in your blood. Carb counting is a method for keeping track of how many carbs you eat. Counting carbs is important to keep your blood glucose at a healthy level, especially if you use insulin or take certain oral diabetes medicines. It is important to know how many carbs you can safely have in each meal. This is different for every person. Your dietitian can help you calculate how many carbs you should have at each meal and for each snack. Foods that contain carbs include:  Bread, cereal, rice, pasta, and crackers.  Potatoes and corn.  Peas, beans, and lentils.  Milk and yogurt.  Fruit and juice.  Desserts, such as cakes, cookies, ice cream, and candy. How does alcohol affect me? Alcohol can cause a sudden decrease in blood glucose (hypoglycemia), especially if you use insulin or take certain oral diabetes medicines. Hypoglycemia can be a life-threatening condition. Symptoms of hypoglycemia (sleepiness, dizziness, and confusion) are similar to symptoms of having too much alcohol. If your health care provider says that alcohol is safe for you, follow these guidelines:  Limit alcohol intake to no more than 1 drink per day for nonpregnant women and 2 drinks per day for men. One drink equals 12 oz of beer, 5 oz of wine, or 1 oz of hard liquor.    Do not drink on an empty stomach.  Keep yourself hydrated with water, diet soda, or unsweetened iced tea.  Keep in mind that regular soda, juice, and other mixers may contain a lot of sugar and must be counted as carbs. What are tips for following this plan?  Reading food labels  Start by checking the serving size on the "Nutrition Facts" label of packaged foods and drinks. The amount of calories, carbs, fats, and other  nutrients listed on the label is based on one serving of the item. Many items contain more than one serving per package.  Check the total grams (g) of carbs in one serving. You can calculate the number of servings of carbs in one serving by dividing the total carbs by 15. For example, if a food has 30 g of total carbs, it would be equal to 2 servings of carbs.  Check the number of grams (g) of saturated and trans fats in one serving. Choose foods that have low or no amount of these fats.  Check the number of milligrams (mg) of salt (sodium) in one serving. Most people should limit total sodium intake to less than 2,300 mg per day.  Always check the nutrition information of foods labeled as "low-fat" or "nonfat". These foods may be higher in added sugar or refined carbs and should be avoided.  Talk to your dietitian to identify your daily goals for nutrients listed on the label. Shopping  Avoid buying canned, premade, or processed foods. These foods tend to be high in fat, sodium, and added sugar.  Shop around the outside edge of the grocery store. This includes fresh fruits and vegetables, bulk grains, fresh meats, and fresh dairy. Cooking  Use low-heat cooking methods, such as baking, instead of high-heat cooking methods like deep frying.  Cook using healthy oils, such as olive, canola, or sunflower oil.  Avoid cooking with butter, cream, or high-fat meats. Meal planning  Eat meals and snacks regularly, preferably at the same times every day. Avoid going long periods of time without eating.  Eat foods high in fiber, such as fresh fruits, vegetables, beans, and whole grains. Talk to your dietitian about how many servings of carbs you can eat at each meal.  Eat 4-6 ounces (oz) of lean protein each day, such as lean meat, chicken, fish, eggs, or tofu. One oz of lean protein is equal to: ? 1 oz of meat, chicken, or fish. ? 1 egg. ?  cup of tofu.  Eat some foods each day that contain  healthy fats, such as avocado, nuts, seeds, and fish. Lifestyle  Check your blood glucose regularly.  Exercise regularly as told by your health care provider. This may include: ? 150 minutes of moderate-intensity or vigorous-intensity exercise each week. This could be brisk walking, biking, or water aerobics. ? Stretching and doing strength exercises, such as yoga or weightlifting, at least 2 times a week.  Take medicines as told by your health care provider.  Do not use any products that contain nicotine or tobacco, such as cigarettes and e-cigarettes. If you need help quitting, ask your health care provider.  Work with a counselor or diabetes educator to identify strategies to manage stress and any emotional and social challenges. Questions to ask a health care provider  Do I need to meet with a diabetes educator?  Do I need to meet with a dietitian?  What number can I call if I have questions?  When are the best times to   times to check my blood glucose? Where to find more information:  American Diabetes Association: diabetes.org  Academy of Nutrition and Dietetics: www.eatright.org  National Institute of Diabetes and Digestive and Kidney Diseases (NIH): www.niddk.nih.gov Summary  A healthy meal plan will help you control your blood glucose and maintain a healthy lifestyle.  Working with a diet and nutrition specialist (dietitian) can help you make a meal plan that is best for you.  Keep in mind that carbohydrates (carbs) and alcohol have immediate effects on your blood glucose levels. It is important to count carbs and to use alcohol carefully. This information is not intended to replace advice given to you by your health care provider. Make sure you discuss any questions you have with your health care provider. Document Revised: 06/26/2017 Document Reviewed: 08/18/2016 [employer] Patient Education  2020 [employer]  

## 2020-07-04 NOTE — Progress Notes (Signed)
Darius Ingram 64 y.o.   Chief Complaint  Patient presents with  . Diabetes    follow up 3 month  . Hypertension    HISTORY OF PRESENT ILLNESS: This is a 64 y.o. male with history of diabetes and hypertension here for follow-up. #1 diabetes: On Trulicity and Metformin. #2 hypertension: On amlodipine 10 mg and lisinopril 20 mg daily #3 dyslipidemia: On atorvastatin 40 mg daily Also takes 1 baby aspirin daily. Fully vaccinated against Covid. Doing well.  Has no complaints or medical concerns today. Lab Results  Component Value Date   HGBA1C 6.9 (A) 04/03/2020   BP Readings from Last 3 Encounters:  07/04/20 125/87  04/03/20 (!) 148/80  03/08/20 118/77   Lab Results  Component Value Date   CREATININE 0.92 04/03/2020   BUN 16 04/03/2020   NA 144 04/03/2020   K 3.9 04/03/2020   CL 105 04/03/2020   CO2 26 04/03/2020   Lab Results  Component Value Date   CHOL 129 04/03/2020   HDL 37 (L) 04/03/2020   LDLCALC 75 04/03/2020   TRIG 88 04/03/2020   CHOLHDL 3.5 04/03/2020     HPI   Prior to Admission medications   Medication Sig Start Date End Date Taking? Authorizing Provider  acetaminophen (TYLENOL) 325 MG tablet Take 650 mg by mouth every 6 (six) hours as needed.   Yes [provider]  amLODipine (NORVASC) 10 MG tablet Take 1 tablet (10 mg total) by mouth daily. 12/29/19  Yes Horald Pollen, MD  aspirin 81 MG EC tablet TAKE 1 TABLET BY MOUTH EVERY DAY 04/16/18  Yes Jaynee Eagles, PA-C  atorvastatin (LIPITOR) 40 MG tablet Take 1 tablet (40 mg total) by mouth daily. 12/29/19  Yes Vernestine Brodhead, Ines Bloomer, MD  cetirizine (ZYRTEC) 10 MG tablet Take 1 tablet (10 mg total) by mouth daily. 01/21/18  Yes Jaynee Eagles, PA-C  Dulaglutide (TRULICITY) 7.34 LP/3.7TK SOPN Inject 0.75 mg into the skin once a week. 05/15/20 08/13/20 Yes Argelia Formisano, Ines Bloomer, MD  fluticasone [facility]) 50 MCG/ACT nasal spray Place 2 sprays into both nostrils daily. 06/21/18  Yes Samie Reasons, Ines Bloomer, MD  lisinopril (ZESTRIL) 20 MG tablet Take 1 tablet (20 mg total) by mouth daily. 12/29/19  Yes Suellyn Meenan, Ines Bloomer, MD  Multiple Vitamin (MULTIVITAMIN PO) Take 1 tablet by mouth daily.   Yes [provider]  metFORMIN (GLUCOPHAGE) 1000 MG tablet Take 1 tablet (1,000 mg total) by mouth 2 (two) times daily with a meal. 12/29/19 03/28/20  Horald Pollen, MD    Allergies  Allergen Reactions  . Penicillins Rash    Patient Active Problem List   Diagnosis Date Noted  . Diabetes mellitus without complication ([place]) 24/03/7352  . Nuclear sclerotic cataract of both eyes 01/24/2020  . Uncontrolled type 2 diabetes mellitus with hyperglycemia ([place]) 12/29/2019  . Hypertension associated with diabetes ([place]) 12/29/2019  . Body mass index (BMI) of 45.0-49.9 in adult [facility]) 12/29/2019    Past Medical History:  Diagnosis Date  . Allergy    seasonal allergies  . Diabetes mellitus without complication ([place])   . Hyperlipidemia   . Hypertension     Past Surgical History:  Procedure Laterality Date  . TONSILECTOMY, ADENOIDECTOMY, BILATERAL MYRINGOTOMY AND TUBES  1960  . TYMPANOSTOMY  1960  . WISDOM TOOTH EXTRACTION  1978    Social History   Socioeconomic History  . Marital status: Single    Spouse name: Not on file  . Number of children: Not on file  .  Years of education: Not on file  . Highest education level: Not on file  Occupational History  . Not on file  Tobacco Use  . Smoking status: Former Smoker    Packs/day: 1.00    Years: 10.00    Pack years: 10.00    Quit date: 12/29/1998    Years since quitting: 21.5  . Smokeless tobacco: Never Used  Vaping Use  . Vaping Use: Never used  Substance and Sexual Activity  . Alcohol use: Yes    Comment: occ  . Drug use: No  . Sexual activity: Never  Other Topics Concern  . Not on file  Social History Narrative  . Not on file   Social Determinants of Health   Financial Resource Strain:   . Difficulty of Paying Living  Expenses: Not on file  Food Insecurity:   . Worried About Charity fundraiser in the Last Year: Not on file  . Ran Out of Food in the Last Year: Not on file  Transportation Needs:   . Lack of Transportation (Medical): Not on file  . Lack of Transportation (Non-Medical): Not on file  Physical Activity:   . Days of Exercise per Week: Not on file  . Minutes of Exercise per Session: Not on file  Stress:   . Feeling of Stress : Not on file  Social Connections:   . Frequency of Communication with Friends and Family: Not on file  . Frequency of Social Gatherings with Friends and Family: Not on file  . Attends Religious Services: Not on file  . Active Member of Clubs or Organizations: Not on file  . Attends Archivist Meetings: Not on file  . Marital Status: Not on file  Intimate Partner Violence:   . Fear of Current or Ex-Partner: Not on file  . Emotionally Abused: Not on file  . Physically Abused: Not on file  . Sexually Abused: Not on file    Family History  Problem Relation Age of Onset  . Colon polyps Neg Hx   . Colon cancer Neg Hx   . Esophageal cancer Neg Hx   . Stomach cancer Neg Hx   . Rectal cancer Neg Hx      Review of Systems  Constitutional: Negative.  Negative for chills and fever.  HENT: Negative.  Negative for congestion and sore throat.   Respiratory: Negative.  Negative for cough and shortness of breath.   Cardiovascular: Negative.  Negative for chest pain and palpitations.  Gastrointestinal: Negative.  Negative for abdominal pain, diarrhea, nausea and vomiting.  Genitourinary: Negative.  Negative for dysuria and hematuria.  Musculoskeletal: Negative.  Negative for back pain, myalgias and neck pain.  Skin: Negative.  Negative for rash.  Neurological: Negative.  Negative for dizziness and headaches.  All other systems reviewed and are negative.   Today's Vitals   07/04/20 0913  BP: 125/87  Pulse: (!) 101  Resp: 18  Temp: (!) 97.4 F (36.3 C)   TempSrc: Temporal  SpO2: 97%  Weight: (!) 337 lb (152.9 kg)  Height: 6' (1.829 m)   Body mass index is 45.71 kg/m. Wt Readings from Last 3 Encounters:  07/04/20 (!) 337 lb (152.9 kg)  04/03/20 (!) 340 lb (154.2 kg)  03/08/20 (!) 347 lb (157.4 kg)    Physical Exam Vitals reviewed.  Constitutional:      Appearance: Normal appearance. He is obese.  HENT:     Head: Normocephalic.  Eyes:     Extraocular Movements: Extraocular  movements intact.     Pupils: Pupils are equal, round, and reactive to light.  Cardiovascular:     Rate and Rhythm: Normal rate and regular rhythm.     Pulses: Normal pulses.     Heart sounds: Normal heart sounds.  Pulmonary:     Effort: Pulmonary effort is normal.     Breath sounds: Normal breath sounds.  Abdominal:     General: There is no distension.     Palpations: Abdomen is soft.     Tenderness: There is no abdominal tenderness.  Musculoskeletal:        General: Normal range of motion.     Cervical back: Normal range of motion and neck supple.     Right lower leg: No edema.     Left lower leg: No edema.  Skin:    General: Skin is warm and dry.     Capillary Refill: Capillary refill takes less than 2 seconds.  Neurological:     General: No focal deficit present.     Mental Status: He is alert and oriented to person, place, and time.  Psychiatric:        Mood and Affect: Mood normal.        Behavior: Behavior normal.    Results for orders placed or performed in visit on 07/04/20 (from the past 24 hour(s))  POCT glucose (manual entry)     Status: Abnormal   Collection Time: 07/04/20  9:34 AM  Result Value Ref Range   POC Glucose 228 (A) 70 - 99 mg/dl  POCT glycosylated hemoglobin (Hb A1C)     Status: Abnormal   Collection Time: 07/04/20  9:40 AM  Result Value Ref Range   Hemoglobin A1C 6.4 (A) 4.0 - 5.6 %   HbA1c POC (<> result, manual entry)     HbA1c, POC (prediabetic range)     HbA1c, POC (controlled diabetic range)        ASSESSMENT & PLAN: Hypertension associated with diabetes ([place]) Well-controlled hypertension.  Continue present medications. Well-controlled diabetes with hemoglobin A1c of 6.4.  Continue Trulicity and Metformin. Diet and nutrition discussed. Continue atorvastatin 40 mg daily. Continue baby aspirin daily. Follow-up in 3 to 6 months.  Trumaine was seen today for diabetes and hypertension.  Diagnoses and all orders for this visit:  Hypertension associated with diabetes ([place])  Uncontrolled type 2 diabetes mellitus with hyperglycemia ([place]) -     POCT glucose (manual entry) -     POCT glycosylated hemoglobin (Hb A1C)  Need for prophylactic vaccination and inoculation against influenza -     Flu Vaccine QUAD High Dose([facility])  Body mass index (BMI) of 45.0-49.9 in adult [facility])  Morbid obesity due to excess calories [facility])    Patient Instructions       If you have lab work done today you will be contacted with your lab results within the next 2 weeks.  If you have not heard from [place] then please contact us. The fastest way to get your results is to register for My Chart.   IF you received an x-ray today, you will receive an invoice from [facility]. Please contact [facility] at [phone] with questions or concerns regarding your invoice.   IF you received labwork today, you will receive an invoice from [facility]. Please contact [facility] at [phone] with questions or concerns regarding your invoice.   Our billing staff will not be able to assist you with questions regarding bills from these companies.  You will be  contacted with the lab results as soon as they are available. The fastest way to get your results is to activate your My Chart account. Instructions are located on the last page of this paperwork. If you have not heard from [place] regarding the results in 2 weeks, please contact this office.     Diabetes Mellitus and Nutrition, Adult When you  have diabetes (diabetes mellitus), it is very important to have healthy eating habits because your blood sugar (glucose) levels are greatly affected by what you eat and drink. Eating healthy foods in the appropriate amounts, at about the same times every day, can help you:  Control your blood glucose.  Lower your risk of heart disease.  Improve your blood pressure.  Reach or maintain a healthy weight. Every person with diabetes is different, and each person has different needs for a meal plan. Your health care provider may recommend that you work with a diet and nutrition specialist (dietitian) to make a meal plan that is best for you. Your meal plan may vary depending on factors such as:  The calories you need.  The medicines you take.  Your weight.  Your blood glucose, blood pressure, and cholesterol levels.  Your activity level.  Other health conditions you have, such as heart or kidney disease. How do carbohydrates affect me? Carbohydrates, also called carbs, affect your blood glucose level more than any other type of food. Eating carbs naturally raises the amount of glucose in your blood. Carb counting is a method for keeping track of how many carbs you eat. Counting carbs is important to keep your blood glucose at a healthy level, especially if you use insulin or take certain oral diabetes medicines. It is important to know how many carbs you can safely have in each meal. This is different for every person. Your dietitian can help you calculate how many carbs you should have at each meal and for each snack. Foods that contain carbs include:  Bread, cereal, rice, pasta, and crackers.  Potatoes and corn.  Peas, beans, and lentils.  Milk and yogurt.  Fruit and juice.  Desserts, such as cakes, cookies, ice cream, and candy. How does alcohol affect me? Alcohol can cause a sudden decrease in blood glucose (hypoglycemia), especially if you use insulin or take certain oral  diabetes medicines. Hypoglycemia can be a life-threatening condition. Symptoms of hypoglycemia (sleepiness, dizziness, and confusion) are similar to symptoms of having too much alcohol. If your health care provider says that alcohol is safe for you, follow these guidelines:  Limit alcohol intake to no more than 1 drink per day for nonpregnant women and 2 drinks per day for men. One drink equals 12 oz of beer, 5 oz of wine, or 1 oz of hard liquor.  Do not drink on an empty stomach.  Keep yourself hydrated with water, diet soda, or unsweetened iced tea.  Keep in mind that regular soda, juice, and other mixers may contain a lot of sugar and must be counted as carbs. What are tips for following this plan?  Reading food labels  Start by checking the serving size on the "Nutrition Facts" label of packaged foods and drinks. The amount of calories, carbs, fats, and other nutrients listed on the label is based on one serving of the item. Many items contain more than one serving per package.  Check the total grams (g) of carbs in one serving. You can calculate the number of servings of carbs in one serving by  dividing the total carbs by 15. For example, if a food has 30 g of total carbs, it would be equal to 2 servings of carbs.  Check the number of grams (g) of saturated and trans fats in one serving. Choose foods that have low or no amount of these fats.  Check the number of milligrams (mg) of salt (sodium) in one serving. Most people should limit total sodium intake to less than 2,300 mg per day.  Always check the nutrition information of foods labeled as "low-fat" or "nonfat". These foods may be higher in added sugar or refined carbs and should be avoided.  Talk to your dietitian to identify your daily goals for nutrients listed on the label. Shopping  Avoid buying canned, premade, or processed foods. These foods tend to be high in fat, sodium, and added sugar.  Shop around the outside edge  of the grocery store. This includes fresh fruits and vegetables, bulk grains, fresh meats, and fresh dairy. Cooking  Use low-heat cooking methods, such as baking, instead of high-heat cooking methods like deep frying.  Cook using healthy oils, such as olive, canola, or sunflower oil.  Avoid cooking with butter, cream, or high-fat meats. Meal planning  Eat meals and snacks regularly, preferably at the same times every day. Avoid going long periods of time without eating.  Eat foods high in fiber, such as fresh fruits, vegetables, beans, and whole grains. Talk to your dietitian about how many servings of carbs you can eat at each meal.  Eat 4-6 ounces (oz) of lean protein each day, such as lean meat, chicken, fish, eggs, or tofu. One oz of lean protein is equal to: ? 1 oz of meat, chicken, or fish. ? 1 egg. ?  cup of tofu.  Eat some foods each day that contain healthy fats, such as avocado, nuts, seeds, and fish. Lifestyle  Check your blood glucose regularly.  Exercise regularly as told by your health care provider. This may include: ? 150 minutes of moderate-intensity or vigorous-intensity exercise each week. This could be brisk walking, biking, or water aerobics. ? Stretching and doing strength exercises, such as yoga or weightlifting, at least 2 times a week.  Take medicines as told by your health care provider.  Do not use any products that contain nicotine or tobacco, such as cigarettes and e-cigarettes. If you need help quitting, ask your health care provider.  Work with a Social worker or diabetes educator to identify strategies to manage stress and any emotional and social challenges. Questions to ask a health care provider  Do I need to meet with a diabetes educator?  Do I need to meet with a dietitian?  What number can I call if I have questions?  When are the best times to check my blood glucose? Where to find more information:  American Diabetes Association:  diabetes.org  Academy of Nutrition and Dietetics: www.eatright.[employer] of Diabetes and Digestive and Kidney Diseases (NIH): [employer] Summary  A healthy meal plan will help you control your blood glucose and maintain a healthy lifestyle.  Working with a diet and nutrition specialist (dietitian) can help you make a meal plan that is best for you.  Keep in mind that carbohydrates (carbs) and alcohol have immediate effects on your blood glucose levels. It is important to count carbs and to use alcohol carefully. This information is not intended to replace advice given to you by your health care provider. Make sure you discuss any questions you have  with your health care provider. Document Revised: 06/26/2017 Document Reviewed: 08/18/2016 Elsevier Patient Education  2020 Elsevier Inc.      Agustina Caroli, MD [facility]

## 2020-07-04 NOTE — Assessment & Plan Note (Signed)
Well-controlled hypertension.  Continue present medications. Well-controlled diabetes with hemoglobin A1c of 6.4.  Continue Trulicity and Metformin. Diet and nutrition discussed. Continue atorvastatin 40 mg daily. Continue baby aspirin daily. Follow-up in 3 to 6 months.

## 2020-07-09 DIAGNOSIS — H40023 Open angle with borderline findings, high risk, bilateral: Secondary | ICD-10-CM | POA: Diagnosis not present

## 2020-07-09 DIAGNOSIS — H2513 Age-related nuclear cataract, bilateral: Secondary | ICD-10-CM | POA: Diagnosis not present

## 2020-07-09 DIAGNOSIS — E119 Type 2 diabetes mellitus without complications: Secondary | ICD-10-CM | POA: Diagnosis not present

## 2020-07-09 DIAGNOSIS — H43393 Other vitreous opacities, bilateral: Secondary | ICD-10-CM | POA: Diagnosis not present

## 2020-09-01 ENCOUNTER — Other Ambulatory Visit: Payer: Self-pay | Admitting: Emergency Medicine

## 2020-09-01 DIAGNOSIS — E1165 Type 2 diabetes mellitus with hyperglycemia: Secondary | ICD-10-CM

## 2020-09-03 ENCOUNTER — Other Ambulatory Visit: Payer: Self-pay

## 2020-09-03 ENCOUNTER — Telehealth: Payer: Self-pay | Admitting: Emergency Medicine

## 2020-09-03 DIAGNOSIS — E1165 Type 2 diabetes mellitus with hyperglycemia: Secondary | ICD-10-CM

## 2020-09-03 MED ORDER — TRULICITY 0.75 MG/0.5ML ~~LOC~~ SOAJ: 0.7500 mg | SUBCUTANEOUS | 2 refills | Status: DC

## 2020-09-03 NOTE — Telephone Encounter (Signed)
Sent to [facility] [address]

## 2020-09-03 NOTE — Telephone Encounter (Signed)
Medication: Dulaglutide (TRULICITY) 3.50 KX/3.8HW SOPN   Has the pt contacted their pharmacy? Yes Preferred pharmacy: [place], [address]  Please be advised refills may take up to 3 business days.  We ask that you follow up with your pharmacy.

## 2020-09-03 NOTE — Telephone Encounter (Signed)
Please review for refill. Prescription expired on 08/13/20. LOV: 07/04/20

## 2020-10-02 ENCOUNTER — Ambulatory Visit (INDEPENDENT_AMBULATORY_CARE_PROVIDER_SITE_OTHER): Payer: Medicare Other | Admitting: Emergency Medicine

## 2020-10-02 ENCOUNTER — Other Ambulatory Visit: Payer: Self-pay

## 2020-10-02 ENCOUNTER — Encounter: Payer: Self-pay | Admitting: Emergency Medicine

## 2020-10-02 VITALS — BP 112/72 | HR 93 | Temp 97.7°F | Resp 16 | Ht 72.0 in | Wt 344.0 lb

## 2020-10-02 DIAGNOSIS — I10 Essential (primary) hypertension: Secondary | ICD-10-CM | POA: Diagnosis not present

## 2020-10-02 DIAGNOSIS — E669 Obesity, unspecified: Secondary | ICD-10-CM

## 2020-10-02 DIAGNOSIS — E1165 Type 2 diabetes mellitus with hyperglycemia: Secondary | ICD-10-CM

## 2020-10-02 DIAGNOSIS — I152 Hypertension secondary to endocrine disorders: Secondary | ICD-10-CM

## 2020-10-02 DIAGNOSIS — E1159 Type 2 diabetes mellitus with other circulatory complications: Secondary | ICD-10-CM | POA: Diagnosis not present

## 2020-10-02 DIAGNOSIS — E1169 Type 2 diabetes mellitus with other specified complication: Secondary | ICD-10-CM | POA: Diagnosis not present

## 2020-10-02 DIAGNOSIS — Z6841 Body Mass Index (BMI) 40.0 and over, adult: Secondary | ICD-10-CM

## 2020-10-02 LAB — POCT GLYCOSYLATED HEMOGLOBIN (HGB A1C): Hemoglobin A1C: 7 % — AB (ref 4.0–5.6)

## 2020-10-02 LAB — GLUCOSE, POCT (MANUAL RESULT ENTRY): POC Glucose: 262 mg/dl — AB (ref 70–99)

## 2020-10-02 MED ORDER — AMLODIPINE BESYLATE 10 MG PO TABS: 10.0000 mg | ORAL_TABLET | Freq: Every day | ORAL | 3 refills | Status: AC

## 2020-10-02 MED ORDER — METFORMIN HCL 1000 MG PO TABS: 1000.0000 mg | ORAL_TABLET | Freq: Two times a day (BID) | ORAL | 3 refills | Status: AC

## 2020-10-02 MED ORDER — ATORVASTATIN CALCIUM 40 MG PO TABS
40.0000 mg | ORAL_TABLET | Freq: Every day | ORAL | 3 refills | Status: DC
Start: 2020-10-02 — End: 2020-12-16

## 2020-10-02 MED ORDER — TRULICITY 1.5 MG/0.5ML ~~LOC~~ SOAJ: 1.5000 mg | SUBCUTANEOUS | 5 refills | Status: DC

## 2020-10-02 MED ORDER — FLUTICASONE PROPIONATE 50 MCG/ACT NA SUSP: 2.0000 | Freq: Every day | NASAL | 3 refills | Status: DC

## 2020-10-02 MED ORDER — LISINOPRIL 20 MG PO TABS: 20.0000 mg | ORAL_TABLET | Freq: Every day | ORAL | 3 refills | Status: DC

## 2020-10-02 NOTE — Assessment & Plan Note (Signed)
Well-controlled hypertension.  Continue present medications amlodipine 10 mg and lisinopril 20 mg daily. Continue daily baby aspirin. Continue atorvastatin 40 mg daily. Hemoglobin A1c at 7.0 today continue Metformin 1000 mg twice a day and increase dose of Trulicity to 1.5 mg weekly. Referred to medical weight management group for evaluation. Follow-up in 3 months.

## 2020-10-02 NOTE — Progress Notes (Signed)
Darius Ingram 65 y.o.   Chief Complaint  Patient presents with  . Diabetes    Follow up 3 month  . Hypertension  . Medication Refill    Pend    HISTORY OF PRESENT ILLNESS: This is a 65 y.o. male with history of diabetes, hypertension, and dyslipidemia here for follow-up and medication refill. Has no complaints or medical concerns today however he has gained weight and not as physically active. In the last 3 months he had a sinus infection and a fall resulting in injury to his right ankle. Compliant with medications and tolerating them well.  No side effects.  HPI   Prior to Admission medications   Medication Sig Start Date End Date Taking? Authorizing Provider  acetaminophen (TYLENOL) 325 MG tablet Take 650 mg by mouth every 6 (six) hours as needed.   Yes [provider]  amLODipine (NORVASC) 10 MG tablet Take 1 tablet (10 mg total) by mouth daily. 12/29/19  Yes Horald Pollen, MD  aspirin 81 MG EC tablet TAKE 1 TABLET BY MOUTH EVERY DAY 04/16/18  Yes Jaynee Eagles, PA-C  atorvastatin (LIPITOR) 40 MG tablet Take 1 tablet (40 mg total) by mouth daily. 12/29/19  Yes Onix Jumper, Ines Bloomer, MD  cetirizine (ZYRTEC) 10 MG tablet Take 1 tablet (10 mg total) by mouth daily. 01/21/18  Yes Jaynee Eagles, PA-C  Dulaglutide (TRULICITY) 5.68 LE/7.5TZ SOPN Inject 0.75 mg into the skin once a week. 09/03/20 12/02/20 Yes Eulamae Greenstein, Ines Bloomer, MD  fluticasone [facility]) 50 MCG/ACT nasal spray Place 2 sprays into both nostrils daily. 06/21/18  Yes Ricki Clack, Ines Bloomer, MD  lisinopril (ZESTRIL) 20 MG tablet Take 1 tablet (20 mg total) by mouth daily. 12/29/19  Yes Evian Derringer, Ines Bloomer, MD  Multiple Vitamin (MULTIVITAMIN PO) Take 1 tablet by mouth daily.   Yes [provider]  metFORMIN (GLUCOPHAGE) 1000 MG tablet Take 1 tablet (1,000 mg total) by mouth 2 (two) times daily with a meal. 12/29/19 03/28/20  Horald Pollen, MD    Allergies  Allergen Reactions  . Penicillins Rash     Patient Active Problem List   Diagnosis Date Noted  . Diabetes mellitus without complication ([place]) 00/17/4944  . Nuclear sclerotic cataract of both eyes 01/24/2020  . Uncontrolled type 2 diabetes mellitus with hyperglycemia ([place]) 12/29/2019  . Hypertension associated with diabetes ([place]) 12/29/2019  . Body mass index (BMI) of 45.0-49.9 in adult [facility]) 12/29/2019    Past Medical History:  Diagnosis Date  . Allergy    seasonal allergies  . Diabetes mellitus without complication ([place])   . Hyperlipidemia   . Hypertension     Past Surgical History:  Procedure Laterality Date  . TONSILECTOMY, ADENOIDECTOMY, BILATERAL MYRINGOTOMY AND TUBES  1960  . TYMPANOSTOMY  1960  . WISDOM TOOTH EXTRACTION  1978    Social History   Socioeconomic History  . Marital status: Single    Spouse name: Not on file  . Number of children: Not on file  . Years of education: Not on file  . Highest education level: Not on file  Occupational History  . Not on file  Tobacco Use  . Smoking status: Former Smoker    Packs/day: 1.00    Years: 10.00    Pack years: 10.00    Quit date: 12/29/1998    Years since quitting: 21.7  . Smokeless tobacco: Never Used  Vaping Use  . Vaping Use: Never used  Substance and Sexual Activity  . Alcohol use: Yes  Comment: occ  . Drug use: No  . Sexual activity: Never  Other Topics Concern  . Not on file  Social History Narrative  . Not on file   Social Determinants of Health   Financial Resource Strain: Not on file  Food Insecurity: Not on file  Transportation Needs: Not on file  Physical Activity: Not on file  Stress: Not on file  Social Connections: Not on file  Intimate Partner Violence: Not on file    Family History  Problem Relation Age of Onset  . Colon polyps Neg Hx   . Colon cancer Neg Hx   . Esophageal cancer Neg Hx   . Stomach cancer Neg Hx   . Rectal cancer Neg Hx      ROS  Today's Vitals   10/02/20 1008  BP: 112/72  Pulse: 93   Resp: 16  Temp: 97.7 F (36.5 C)  TempSrc: Temporal  SpO2: (!) 64%  Weight: (!) 344 lb (156 kg)  Height: 6' (1.829 m)   Body mass index is 46.65 kg/m. Wt Readings from Last 3 Encounters:  10/02/20 (!) 344 lb (156 kg)  07/04/20 (!) 337 lb (152.9 kg)  04/03/20 (!) 340 lb (154.2 kg)    Physical Exam  Results for orders placed or performed in visit on 10/02/20 (from the past 24 hour(s))  POCT glucose (manual entry)     Status: Abnormal   Collection Time: 10/02/20 10:15 AM  Result Value Ref Range   POC Glucose 262 (A) 70 - 99 mg/dl  POCT glycosylated hemoglobin (Hb A1C)     Status: Abnormal   Collection Time: 10/02/20 10:19 AM  Result Value Ref Range   Hemoglobin A1C 7.0 (A) 4.0 - 5.6 %   HbA1c POC (<> result, manual entry)     HbA1c, POC (prediabetic range)     HbA1c, POC (controlled diabetic range)      ASSESSMENT & PLAN: Hypertension associated with diabetes ([place]) Well-controlled hypertension.  Continue present medications amlodipine 10 mg and lisinopril 20 mg daily. Continue daily baby aspirin. Continue atorvastatin 40 mg daily. Hemoglobin A1c at 7.0 today continue Metformin 1000 mg twice a day and increase dose of Trulicity to 1.5 mg weekly. Referred to medical weight management group for evaluation. Follow-up in 3 months.  Equan was seen today for diabetes, hypertension and medication refill.  Diagnoses and all orders for this visit:  Obesity, diabetes, and hypertension syndrome ([place]) -     Lipid panel -     Amb Ref to Medical Weight Management -     Dulaglutide (TRULICITY) 1.5 VO/3.5KK SOPN; Inject 1.5 mg into the skin once a week.  Hypertension associated with diabetes ([place]) -     POCT glucose (manual entry) -     POCT glycosylated hemoglobin (Hb A1C) -     atorvastatin (LIPITOR) 40 MG tablet; Take 1 tablet (40 mg total) by mouth daily.  Uncontrolled hypertension -     amLODipine (NORVASC) 10 MG tablet; Take 1 tablet (10 mg total) by mouth daily. -      lisinopril (ZESTRIL) 20 MG tablet; Take 1 tablet (20 mg total) by mouth daily.  Uncontrolled type 2 diabetes mellitus with hyperglycemia ([facility]) -     metFORMIN (GLUCOPHAGE) 1000 MG tablet; Take 1 tablet (1,000 mg total) by mouth 2 (two) times daily with a meal. -     Dulaglutide (TRULICITY) 1.5 XF/8.1WE SOPN; Inject 1.5 mg into the skin once a week.  Body mass index (BMI) of 45.0-49.9 in  adult ([place]) -     Amb Ref to Medical Weight Management  Other orders -     fluticasone (FLONASE) 50 MCG/ACT nasal spray; Place 2 sprays into both nostrils daily.    Patient Instructions       If you have lab work done today you will be contacted with your lab results within the next 2 weeks.  If you have not heard from [place] then please contact us. The fastest way to get your results is to register for My Chart.   IF you received an x-ray today, you will receive an invoice from [facility]. Please contact [facility] at [phone] with questions or concerns regarding your invoice.   IF you received labwork today, you will receive an invoice from [facility]. Please contact [facility] at [phone] with questions or concerns regarding your invoice.   Our billing staff will not be able to assist you with questions regarding bills from these companies.  You will be contacted with the lab results as soon as they are available. The fastest way to get your results is to activate your My Chart account. Instructions are located on the last page of this paperwork. If you have not heard from [place] regarding the results in 2 weeks, please contact this office.     Diabetes Mellitus and Nutrition, Adult When you have diabetes, or diabetes mellitus, it is very important to have healthy eating habits because your blood sugar (glucose) levels are greatly affected by what you eat and drink. Eating healthy foods in the right amounts, at about the same times every day, can help you:  Control your blood  glucose.  Lower your risk of heart disease.  Improve your blood pressure.  Reach or maintain a healthy weight. What can affect my meal plan? Every person with diabetes is different, and each person has different needs for a meal plan. Your health care provider may recommend that you work with a dietitian to make a meal plan that is best for you. Your meal plan may vary depending on factors such as:  The calories you need.  The medicines you take.  Your weight.  Your blood glucose, blood pressure, and cholesterol levels.  Your activity level.  Other health conditions you have, such as heart or kidney disease. How do carbohydrates affect me? Carbohydrates, also called carbs, affect your blood glucose level more than any other type of food. Eating carbs naturally raises the amount of glucose in your blood. Carb counting is a method for keeping track of how many carbs you eat. Counting carbs is important to keep your blood glucose at a healthy level, especially if you use insulin or take certain oral diabetes medicines. It is important to know how many carbs you can safely have in each meal. This is different for every person. Your dietitian can help you calculate how many carbs you should have at each meal and for each snack. How does alcohol affect me? Alcohol can cause a sudden decrease in blood glucose (hypoglycemia), especially if you use insulin or take certain oral diabetes medicines. Hypoglycemia can be a life-threatening condition. Symptoms of hypoglycemia, such as sleepiness, dizziness, and confusion, are similar to symptoms of having too much alcohol.  Do not drink alcohol if: ? Your health care provider tells you not to drink. ? You are pregnant, may be pregnant, or are planning to become pregnant.  If you drink alcohol: ? Do not drink on an empty stomach. ? Limit how much  you use to:  0-1 drink a day for women.  0-2 drinks a day for men. ? Be aware of how much alcohol  is in your drink. In the [place] one drink equals one 12 oz bottle of beer (355 mL), one 5 oz glass of wine (148 mL), or one 1 oz glass of hard liquor (44 mL). ? Keep yourself hydrated with water, diet soda, or unsweetened iced tea.  Keep in mind that regular soda, juice, and other mixers may contain a lot of sugar and must be counted as carbs. What are tips for following this plan? Reading food labels  Start by checking the serving size on the "Nutrition Facts" label of packaged foods and drinks. The amount of calories, carbs, fats, and other nutrients listed on the label is based on one serving of the item. Many items contain more than one serving per package.  Check the total grams (g) of carbs in one serving. You can calculate the number of servings of carbs in one serving by dividing the total carbs by 15. For example, if a food has 30 g of total carbs per serving, it would be equal to 2 servings of carbs.  Check the number of grams (g) of saturated fats and trans fats in one serving. Choose foods that have a low amount or none of these fats.  Check the number of milligrams (mg) of salt (sodium) in one serving. Most people should limit total sodium intake to less than 2,300 mg per day.  Always check the nutrition information of foods labeled as "low-fat" or "nonfat." These foods may be higher in added sugar or refined carbs and should be avoided.  Talk to your dietitian to identify your daily goals for nutrients listed on the label. Shopping  Avoid buying canned, pre-made, or processed foods. These foods tend to be high in fat, sodium, and added sugar.  Shop around the outside edge of the grocery store. This is where you will most often find fresh fruits and vegetables, bulk grains, fresh meats, and fresh dairy. Cooking  Use low-heat cooking methods, such as baking, instead of high-heat cooking methods like deep frying.  Cook using healthy oils, such as olive, canola, or sunflower  oil.  Avoid cooking with butter, cream, or high-fat meats. Meal planning  Eat meals and snacks regularly, preferably at the same times every day. Avoid going long periods of time without eating.  Eat foods that are high in fiber, such as fresh fruits, vegetables, beans, and whole grains. Talk with your dietitian about how many servings of carbs you can eat at each meal.  Eat 4-6 oz (112-168 g) of lean protein each day, such as lean meat, chicken, fish, eggs, or tofu. One ounce (oz) of lean protein is equal to: ? 1 oz (28 g) of meat, chicken, or fish. ? 1 egg. ?  cup (62 g) of tofu.  Eat some foods each day that contain healthy fats, such as avocado, nuts, seeds, and fish.   What foods should I eat? Fruits Berries. Apples. Oranges. Peaches. Apricots. Plums. Grapes. Mango. Papaya. Pomegranate. Kiwi. Cherries. Vegetables Lettuce. Spinach. Leafy greens, including kale, chard, collard greens, and mustard greens. Beets. Cauliflower. Cabbage. Broccoli. Carrots. Green beans. Tomatoes. Peppers. Onions. Cucumbers. Brussels sprouts. Grains Whole grains, such as whole-wheat or whole-grain bread, crackers, tortillas, cereal, and pasta. Unsweetened oatmeal. Quinoa. Brown or wild rice. Meats and other proteins Seafood. Poultry without skin. Lean cuts of poultry and beef. Tofu. Nuts. Seeds. Dairy  Low-fat or fat-free dairy products such as milk, yogurt, and cheese. The items listed above may not be a complete list of foods and beverages you can eat. Contact a dietitian for more information. What foods should I avoid? Fruits Fruits canned with syrup. Vegetables Canned vegetables. Frozen vegetables with butter or cream sauce. Grains Refined white flour and flour products such as bread, pasta, snack foods, and cereals. Avoid all processed foods. Meats and other proteins Fatty cuts of meat. Poultry with skin. Breaded or fried meats. Processed meat. Avoid saturated fats. Dairy Full-fat yogurt,  cheese, or milk. Beverages Sweetened drinks, such as soda or iced tea. The items listed above may not be a complete list of foods and beverages you should avoid. Contact a dietitian for more information. Questions to ask a health care provider  Do I need to meet with a diabetes educator?  Do I need to meet with a dietitian?  What number can I call if I have questions?  When are the best times to check my blood glucose? Where to find more information:  American Diabetes Association: diabetes.org  Academy of Nutrition and Dietetics: www.eatright.[employer] of Diabetes and Digestive and Kidney Diseases: [employer]  Association of Diabetes Care and Education Specialists: www.diabeteseducator.org Summary  It is important to have healthy eating habits because your blood sugar (glucose) levels are greatly affected by what you eat and drink.  A healthy meal plan will help you control your blood glucose and maintain a healthy lifestyle.  Your health care provider may recommend that you work with a dietitian to make a meal plan that is best for you.  Keep in mind that carbohydrates (carbs) and alcohol have immediate effects on your blood glucose levels. It is important to count carbs and to use alcohol carefully. This information is not intended to replace advice given to you by your health care provider. Make sure you discuss any questions you have with your health care provider. Document Revised: 06/21/2019 Document Reviewed: 06/21/2019 Elsevier Patient Education  2021 Elsevier Inc.      Agustina Caroli, MD [facility]

## 2020-10-02 NOTE — Patient Instructions (Addendum)
   If you have lab work done today you will be contacted with your lab results within the next 2 weeks.  If you have not heard from us then please contact us. The fastest way to get your results is to register for My Chart.   IF you received an x-ray today, you will receive an invoice from [facility]. Please contact [facility] at [phone] with questions or concerns regarding your invoice.   IF you received labwork today, you will receive an invoice from [facility]. Please contact [facility] at [phone] with questions or concerns regarding your invoice.   Our billing staff will not be able to assist you with questions regarding bills from these companies.  You will be contacted with the lab results as soon as they are available. The fastest way to get your results is to activate your My Chart account. Instructions are located on the last page of this paperwork. If you have not heard from us regarding the results in 2 weeks, please contact this office.     Diabetes Mellitus and Nutrition, Adult When you have diabetes, or diabetes mellitus, it is very important to have healthy eating habits because your blood sugar (glucose) levels are greatly affected by what you eat and drink. Eating healthy foods in the right amounts, at about the same times every day, can help you:  Control your blood glucose.  Lower your risk of heart disease.  Improve your blood pressure.  Reach or maintain a healthy weight. What can affect my meal plan? Every person with diabetes is different, and each person has different needs for a meal plan. Your health care provider may recommend that you work with a dietitian to make a meal plan that is best for you. Your meal plan may vary depending on factors such as:  The calories you need.  The medicines you take.  Your weight.  Your blood glucose, blood pressure, and cholesterol levels.  Your activity level.  Other health conditions you  have, such as heart or kidney disease. How do carbohydrates affect me? Carbohydrates, also called carbs, affect your blood glucose level more than any other type of food. Eating carbs naturally raises the amount of glucose in your blood. Carb counting is a method for keeping track of how many carbs you eat. Counting carbs is important to keep your blood glucose at a healthy level, especially if you use insulin or take certain oral diabetes medicines. It is important to know how many carbs you can safely have in each meal. This is different for every person. Your dietitian can help you calculate how many carbs you should have at each meal and for each snack. How does alcohol affect me? Alcohol can cause a sudden decrease in blood glucose (hypoglycemia), especially if you use insulin or take certain oral diabetes medicines. Hypoglycemia can be a life-threatening condition. Symptoms of hypoglycemia, such as sleepiness, dizziness, and confusion, are similar to symptoms of having too much alcohol.  Do not drink alcohol if: ? Your health care provider tells you not to drink. ? You are pregnant, may be pregnant, or are planning to become pregnant.  If you drink alcohol: ? Do not drink on an empty stomach. ? Limit how much you use to:  0-1 drink a day for women.  0-2 drinks a day for men. ? Be aware of how much alcohol is in your drink. In the [place] one drink equals one 12 oz bottle of beer (355 mL),   one 5 oz glass of wine (148 mL), or one 1 oz glass of hard liquor (44 mL). ? Keep yourself hydrated with water, diet soda, or unsweetened iced tea.  Keep in mind that regular soda, juice, and other mixers may contain a lot of sugar and must be counted as carbs. What are tips for following this plan? Reading food labels  Start by checking the serving size on the "Nutrition Facts" label of packaged foods and drinks. The amount of calories, carbs, fats, and other nutrients listed on the label is based on  one serving of the item. Many items contain more than one serving per package.  Check the total grams (g) of carbs in one serving. You can calculate the number of servings of carbs in one serving by dividing the total carbs by 15. For example, if a food has 30 g of total carbs per serving, it would be equal to 2 servings of carbs.  Check the number of grams (g) of saturated fats and trans fats in one serving. Choose foods that have a low amount or none of these fats.  Check the number of milligrams (mg) of salt (sodium) in one serving. Most people should limit total sodium intake to less than 2,300 mg per day.  Always check the nutrition information of foods labeled as "low-fat" or "nonfat." These foods may be higher in added sugar or refined carbs and should be avoided.  Talk to your dietitian to identify your daily goals for nutrients listed on the label. Shopping  Avoid buying canned, pre-made, or processed foods. These foods tend to be high in fat, sodium, and added sugar.  Shop around the outside edge of the grocery store. This is where you will most often find fresh fruits and vegetables, bulk grains, fresh meats, and fresh dairy. Cooking  Use low-heat cooking methods, such as baking, instead of high-heat cooking methods like deep frying.  Cook using healthy oils, such as olive, canola, or sunflower oil.  Avoid cooking with butter, cream, or high-fat meats. Meal planning  Eat meals and snacks regularly, preferably at the same times every day. Avoid going long periods of time without eating.  Eat foods that are high in fiber, such as fresh fruits, vegetables, beans, and whole grains. Talk with your dietitian about how many servings of carbs you can eat at each meal.  Eat 4-6 oz (112-168 g) of lean protein each day, such as lean meat, chicken, fish, eggs, or tofu. One ounce (oz) of lean protein is equal to: ? 1 oz (28 g) of meat, chicken, or fish. ? 1 egg. ?  cup (62 g) of  tofu.  Eat some foods each day that contain healthy fats, such as avocado, nuts, seeds, and fish.   What foods should I eat? Fruits Berries. Apples. Oranges. Peaches. Apricots. Plums. Grapes. Mango. Papaya. Pomegranate. Kiwi. Cherries. Vegetables Lettuce. Spinach. Leafy greens, including kale, chard, collard greens, and mustard greens. Beets. Cauliflower. Cabbage. Broccoli. Carrots. Green beans. Tomatoes. Peppers. Onions. Cucumbers. Brussels sprouts. Grains Whole grains, such as whole-wheat or whole-grain bread, crackers, tortillas, cereal, and pasta. Unsweetened oatmeal. Quinoa. Brown or wild rice. Meats and other proteins Seafood. Poultry without skin. Lean cuts of poultry and beef. Tofu. Nuts. Seeds. Dairy Low-fat or fat-free dairy products such as milk, yogurt, and cheese. The items listed above may not be a complete list of foods and beverages you can eat. Contact a dietitian for more information. What foods should I avoid? Fruits Fruits canned   with syrup. Vegetables Canned vegetables. Frozen vegetables with butter or cream sauce. Grains Refined white flour and flour products such as bread, pasta, snack foods, and cereals. Avoid all processed foods. Meats and other proteins Fatty cuts of meat. Poultry with skin. Breaded or fried meats. Processed meat. Avoid saturated fats. Dairy Full-fat yogurt, cheese, or milk. Beverages Sweetened drinks, such as soda or iced tea. The items listed above may not be a complete list of foods and beverages you should avoid. Contact a dietitian for more information. Questions to ask a health care provider  Do I need to meet with a diabetes educator?  Do I need to meet with a dietitian?  What number can I call if I have questions?  When are the best times to check my blood glucose? Where to find more information:  American Diabetes Association: diabetes.org  Academy of Nutrition and Dietetics: www.eatright.org  National Institute of  Diabetes and Digestive and Kidney Diseases: www.niddk.nih.gov  Association of Diabetes Care and Education Specialists: www.diabeteseducator.org Summary  It is important to have healthy eating habits because your blood sugar (glucose) levels are greatly affected by what you eat and drink.  A healthy meal plan will help you control your blood glucose and maintain a healthy lifestyle.  Your health care provider may recommend that you work with a dietitian to make a meal plan that is best for you.  Keep in mind that carbohydrates (carbs) and alcohol have immediate effects on your blood glucose levels. It is important to count carbs and to use alcohol carefully. This information is not intended to replace advice given to you by your health care provider. Make sure you discuss any questions you have with your health care provider. Document Revised: 06/21/2019 Document Reviewed: 06/21/2019 Elsevier Patient Education  2021 Elsevier Inc.  

## 2020-10-03 LAB — LIPID PANEL
Chol/HDL Ratio: 4.1 ratio (ref 0.0–5.0)
Cholesterol, Total: 152 mg/dL (ref 100–199)
HDL: 37 mg/dL — ABNORMAL LOW (ref >39)
LDL Chol Calc (NIH): 93 mg/dL (ref 0–99)
Triglycerides: 123 mg/dL (ref 0–149)
VLDL Cholesterol Cal: 22 mg/dL (ref 5–40)

## 2020-10-11 ENCOUNTER — Ambulatory Visit (INDEPENDENT_AMBULATORY_CARE_PROVIDER_SITE_OTHER): Payer: Medicare Other | Admitting: Family Medicine

## 2020-10-11 ENCOUNTER — Encounter (INDEPENDENT_AMBULATORY_CARE_PROVIDER_SITE_OTHER): Payer: Self-pay | Admitting: Family Medicine

## 2020-10-11 ENCOUNTER — Other Ambulatory Visit: Payer: Self-pay

## 2020-10-11 VITALS — BP 137/78 | HR 45 | Temp 98.1°F | Ht 72.0 in | Wt 339.0 lb

## 2020-10-11 DIAGNOSIS — I152 Hypertension secondary to endocrine disorders: Secondary | ICD-10-CM

## 2020-10-11 DIAGNOSIS — R0683 Snoring: Secondary | ICD-10-CM

## 2020-10-11 DIAGNOSIS — R9431 Abnormal electrocardiogram [ECG] [EKG]: Secondary | ICD-10-CM

## 2020-10-11 DIAGNOSIS — Z6841 Body Mass Index (BMI) 40.0 and over, adult: Secondary | ICD-10-CM

## 2020-10-11 DIAGNOSIS — R948 Abnormal results of function studies of other organs and systems: Secondary | ICD-10-CM | POA: Diagnosis not present

## 2020-10-11 DIAGNOSIS — E65 Localized adiposity: Secondary | ICD-10-CM

## 2020-10-11 DIAGNOSIS — E1159 Type 2 diabetes mellitus with other circulatory complications: Secondary | ICD-10-CM | POA: Diagnosis not present

## 2020-10-11 DIAGNOSIS — E1169 Type 2 diabetes mellitus with other specified complication: Secondary | ICD-10-CM

## 2020-10-11 DIAGNOSIS — R5383 Other fatigue: Secondary | ICD-10-CM

## 2020-10-11 DIAGNOSIS — F3289 Other specified depressive episodes: Secondary | ICD-10-CM | POA: Diagnosis not present

## 2020-10-11 DIAGNOSIS — E785 Hyperlipidemia, unspecified: Secondary | ICD-10-CM

## 2020-10-11 DIAGNOSIS — R0602 Shortness of breath: Secondary | ICD-10-CM | POA: Diagnosis not present

## 2020-10-11 DIAGNOSIS — E66813 Obesity, class 3: Secondary | ICD-10-CM

## 2020-10-11 DIAGNOSIS — M25571 Pain in right ankle and joints of right foot: Secondary | ICD-10-CM

## 2020-10-11 NOTE — Progress Notes (Signed)
ADDENDUM:  Abnormal EKG. Asymptomatic, other than dyspnea on exertion. Afib clinic today. Greatly appreciate the quick evaluation. Spoke with patient and he will be available for appointment. Red Flags reviewed.      Dear Dr. Mitchel Honour,   Thank you for referring Darius Ingram to our clinic. The following note includes my evaluation and treatment recommendations.  Chief Complaint:   OBESITY Darius Ingram (MR# [id number]) is a 65 y.o. male who presents for evaluation and treatment of obesity and related comorbidities. Current BMI is Body mass index is 45.98 kg/m. Darius Ingram has been struggling with his weight for many years and has been unsuccessful in either losing weight, maintaining weight loss, or reaching his healthy weight goal.  Darius Ingram is currently in the action stage of change and ready to dedicate time achieving and maintaining a healthier weight. Darius Ingram is interested in becoming our patient and working on intensive lifestyle modifications including (but not limited to) diet and exercise for weight loss.  Darius Ingram is retired.  His goal is a 100 pound weight loss.  He has already dropped 7 pounds on his won.  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  He likes dark chocolate.  Walks ~3 times per day.  Darius Ingram provided the following food recall today:  Breakfast:  2 eggs, bacon, grits. Lunch:  1/2 sandwich, chicken breast. Snack:  Yogurt. Dinner:  1/2 sandwich, chicken breast.  Darius Ingram's habits were reviewed today and are as follows: his desired weight loss is 106 pounds, he has been heavy most of his life, he started gaining weight at age 35, his heaviest weight ever was 370 pounds, he craves chocolate and ice cream, he wakes up sometimes in the middle of the night to eat, he skips lunch 2 times per week, he is frequently drinking liquids with calories and he struggles with emotional eating.  Depression Screen Darius Ingram's Food and Mood (modified PHQ-9) score was 8.  Depression  screen PHQ 2/9 10/11/2020  Decreased Interest 1  Down, Depressed, Hopeless 1  PHQ - 2 Score 2  Altered sleeping 1  Tired, decreased energy 1  Change in appetite 1  Feeling bad or failure about yourself  1  Trouble concentrating 1  Moving slowly or fidgety/restless 0  Suicidal thoughts 1  PHQ-9 Score 8  Difficult doing work/chores Not difficult at all   Assessment/Plan:   1. Other fatigue Darius Ingram denies daytime somnolence and denies waking up still tired. Patent has a history of symptoms of snoring. Darius Ingram generally gets 6-8 hours of sleep per night, and states that he has generally restful sleep. Snoring is present. Apneic episodes are present. Epworth Sleepiness Score is 5.  Darius Ingram does feel that his weight is causing his energy to be lower than it should be. Fatigue may be related to obesity, depression or many other causes. Labs will be ordered, and in the meanwhile, Darius Ingram will focus on self care including making healthy food choices, increasing physical activity and focusing on stress reduction.  - EKG 12-Lead  2. SOB (shortness of breath) on exertion Darius Ingram notes increasing shortness of breath with exercising and seems to be worsening over time with weight gain. He notes getting out of breath sooner with activity than he used to. This has gotten worse recently. Darius Ingram denies shortness of breath at rest or orthopnea.  Darius Ingram has a sinus condition, which he says contributes to his shortness of breath.  Darius Ingram does feel that he gets out of breath more easily that he used to when  he exercises. Darius Ingram shortness of breath appears to be obesity related and exercise induced. He has agreed to work on weight loss and gradually increase exercise to treat his exercise induced shortness of breath. Will continue to monitor closely.  3. Abnormal metabolism Darius Ingram' metabolism is lower than normal.  4. Visceral obesity Current visceral fat rating: 33. Visceral fat rating should be < 13. Visceral  adipose tissue is a hormonally active component of total body fat. This body composition phenotype is associated with medical disorders such as metabolic syndrome, cardiovascular disease and several malignancies including prostate, breast, and colorectal cancers. Starting goal: Lose 7-10% of starting weight.   5. Type 2 diabetes mellitus with other specified complication, without long-term current use of insulin (HCC) Diabetes Mellitus: Not at goal. Medication: Trulicity 2.22 mg subcutaneously weekly, metformin 1,00 mg twice daily.  His Trulicity was increased to 1.5 mg, but he has not started the new dose yet. Issues reviewed: blood sugar goals, complications of diabetes mellitus, hypoglycemia prevention and treatment, exercise, and nutrition.   Plan: The importance of regular follow up with PCP and all other specialists as scheduled was stressed to patient today.  Lab Results  Component Value Date   HGBA1C 7.0 (A) 10/02/2020   HGBA1C 6.4 (A) 07/04/2020   HGBA1C 6.9 (A) 04/03/2020   Lab Results  Component Value Date   LDLCALC 93 10/02/2020   CREATININE 0.92 04/03/2020   6. Hypertension associated with type 2 diabetes mellitus ([place]) At goal. Medications: Norvasc 10 mg daily, lisinopril 20 mg daily.   Plan: Avoid buying foods that are: processed, frozen, or prepackaged to avoid excess salt. We will watch for signs of hypotension as he continues lifestyle modifications. We will continue to monitor closely alongside his PCP and/or Specialist.  Regular follow up with PCP and specialists was also encouraged.   BP Readings from Last 3 Encounters:  10/15/20 126/82  10/11/20 137/78  10/02/20 112/72   Lab Results  Component Value Date   CREATININE 0.92 04/03/2020   7. Hyperlipidemia associated with type 2 diabetes mellitus ([place]) Course: Controlled. Lipid-lowering medications: Lipitor 40 mg daily.   Plan: Dietary changes: Increase soluble fiber, decrease simple carbohydrates, decrease  saturated fat. Exercise changes: Moderate to vigorous-intensity aerobic activity 150 minutes per week or as tolerated. We will continue to monitor along with PCP/specialists as it pertains to his weight loss journey.  Lab Results  Component Value Date   CHOL 152 10/02/2020   HDL 37 (L) 10/02/2020   LDLCALC 93 10/02/2020   TRIG 123 10/02/2020   CHOLHDL 4.1 10/02/2020   Lab Results  Component Value Date   ALT 16 04/03/2020   AST 16 04/03/2020   ALKPHOS 85 04/03/2020   BILITOT 0.5 04/03/2020   The 10-year ASCVD risk score Darius Bussing DC Jr., Darius al., Darius Ingram) is: 25.5%   Values used to calculate the score:     Age: 26 years     Sex: Male     Is Non-Hispanic African American: No     Diabetic: Yes     Tobacco smoker: No     Systolic Blood Pressure: 979 mmHg     Is BP treated: Yes     HDL Cholesterol: 37 mg/dL     Total Cholesterol: 152 mg/dL  8. Snores Darius Ingram endorses snoring.  Positive apnea.  Epworth score is 5.  9. Right ankle pain, unspecified chronicity Contributed to weight gain.  Status post ankle sprain.  Improving.  10. Other depression, with emotional eating Not at  goal. Medication: None.  He eats when stressed, as a reward, when bored, and for comfort.  Plan:  Behavior modification techniques were discussed today to help deal with emotional/non-hunger eating behaviors.  11. Class 3 severe obesity with serious comorbidity and body mass index (BMI) of 45.0 to 49.9 in adult, unspecified obesity type (HCC)  Darius Ingram is currently in the action stage of change and his goal is to continue with weight loss efforts. I recommend Darius Ingram begin the structured treatment plan as follows:  He has agreed to practicing portion control and making smarter food choices, such as increasing vegetables and decreasing simple carbohydrates.  Exercise goals: As is.   Behavioral modification strategies: increasing lean protein intake, decreasing simple carbohydrates, increasing vegetables, increasing  water intake and decreasing liquid calories.  He was informed of the importance of frequent follow-up visits to maximize his success with intensive lifestyle modifications for his multiple health conditions. He was informed we would discuss his lab results at his next visit unless there is a critical issue that needs to be addressed sooner. Darius Ingram agreed to keep his next visit at the agreed upon time to discuss these results.  Objective:   Blood pressure 137/78, pulse (!) 45, temperature 98.1 F (36.7 C), height 6' (1.829 m), weight (!) 339 lb (153.8 kg), SpO2 97 %. Body mass index is 45.98 kg/m.  EKG: Normal sinus rhythm, rate 103 bpm.  Indirect Calorimeter completed today shows a VO2 of 346 and a REE of 2408.  His calculated basal metabolic rate is 2836 thus his basal metabolic rate is worse than expected.  General: Cooperative, alert, well developed, in no acute distress. HEENT: Conjunctivae and lids unremarkable. Cardiovascular: Regular rhythm.  Lungs: Normal work of breathing. Neurologic: No focal deficits.   Lab Results  Component Value Date   CREATININE 0.92 04/03/2020   BUN 16 04/03/2020   NA 144 04/03/2020   K 3.9 04/03/2020   CL 105 04/03/2020   CO2 26 04/03/2020   Lab Results  Component Value Date   ALT 16 04/03/2020   AST 16 04/03/2020   ALKPHOS 85 04/03/2020   BILITOT 0.5 04/03/2020   Lab Results  Component Value Date   HGBA1C 7.0 (A) 10/02/2020   HGBA1C 6.4 (A) 07/04/2020   HGBA1C 6.9 (A) 04/03/2020   HGBA1C 9.9 (A) 12/29/2019   HGBA1C 7.4 (A) 04/20/2018   Lab Results  Component Value Date   CHOL 152 10/02/2020   HDL 37 (L) 10/02/2020   LDLCALC 93 10/02/2020   TRIG 123 10/02/2020   CHOLHDL 4.1 10/02/2020   Lab Results  Component Value Date   WBC 11.7 (H) 04/03/2020   HGB 14.4 04/03/2020   HCT 40.8 04/03/2020   MCV 86 04/03/2020   PLT 257 04/03/2020   Obesity Behavioral Intervention:   Approximately 15 minutes were spent on the discussion  below.  ASK: We discussed the diagnosis of obesity with Darius Ingram today and Brennin agreed to give [place] permission to discuss obesity behavioral modification therapy today.  ASSESS: Johnston has the diagnosis of obesity and his BMI today is 46.0. Legion is in the action stage of change.   ADVISE: Echo was educated on the multiple health risks of obesity as well as the benefit of weight loss to improve his health. He was advised of the need for long term treatment and the importance of lifestyle modifications to improve his current health and to decrease his risk of future health problems.  AGREE: Multiple dietary modification options and treatment  options were discussed and Dreshawn agreed to follow the recommendations documented in the above note.  ARRANGE: Kartier was educated on the importance of frequent visits to treat obesity as outlined per CMS and USPSTF guidelines and agreed to schedule his next follow up appointment today.  Attestation Statements:   This is the patient's first visit at [facility]. The patient's NEW PATIENT PACKET was reviewed at length. Included in the packet: current and past health history, medications, allergies, ROS, gynecologic history (women only), surgical history, family history, social history, weight history, weight loss surgery history (for those that have had weight loss surgery), nutritional evaluation, mood and food questionnaire, PHQ9, Epworth questionnaire, sleep habits questionnaire, patient life and health improvement goals questionnaire. These will all be scanned into the patient's chart under media.   During the visit, I independently reviewed the patient's EKG, bioimpedance scale results, and indirect calorimeter results. I used this information to tailor a meal plan for the patient that will help him to lose weight and will improve his obesity-related conditions going forward. I performed a medically necessary appropriate examination  and/or evaluation. I discussed the assessment and treatment plan with the patient. The patient was provided an opportunity to ask questions and all were answered. The patient agreed with the plan and demonstrated an understanding of the instructions. Labs were ordered at this visit and will be reviewed at the next visit unless more critical results need to be addressed immediately. Clinical information was updated and documented in the EMR.   I, Water quality scientist, CMA, am acting as transcriptionist for Briscoe Deutscher, DO  I have reviewed the above documentation for accuracy and completeness, and I agree with the above. Briscoe Deutscher, DO

## 2020-10-15 ENCOUNTER — Encounter (HOSPITAL_COMMUNITY): Payer: Self-pay | Admitting: Physician Assistant

## 2020-10-15 ENCOUNTER — Other Ambulatory Visit: Payer: Self-pay

## 2020-10-15 ENCOUNTER — Ambulatory Visit (HOSPITAL_COMMUNITY)
Admission: RE | Admit: 2020-10-15 | Discharge: 2020-10-15 | Disposition: A | Discharge status: Home / Self Care | Payer: Medicare Other | Source: Ambulatory Visit | Attending: Physician Assistant | Admitting: Physician Assistant

## 2020-10-15 VITALS — BP 126/82 | HR 97 | Ht 72.0 in | Wt 347.8 lb

## 2020-10-15 DIAGNOSIS — E119 Type 2 diabetes mellitus without complications: Secondary | ICD-10-CM | POA: Insufficient documentation

## 2020-10-15 DIAGNOSIS — D6869 Other thrombophilia: Secondary | ICD-10-CM | POA: Diagnosis not present

## 2020-10-15 DIAGNOSIS — Z6841 Body Mass Index (BMI) 40.0 and over, adult: Secondary | ICD-10-CM | POA: Diagnosis not present

## 2020-10-15 DIAGNOSIS — R400 Somnolence: Secondary | ICD-10-CM | POA: Diagnosis not present

## 2020-10-15 DIAGNOSIS — E669 Obesity, unspecified: Secondary | ICD-10-CM | POA: Insufficient documentation

## 2020-10-15 DIAGNOSIS — Z87891 Personal history of nicotine dependence: Secondary | ICD-10-CM | POA: Insufficient documentation

## 2020-10-15 DIAGNOSIS — I10 Essential (primary) hypertension: Secondary | ICD-10-CM | POA: Insufficient documentation

## 2020-10-15 DIAGNOSIS — I480 Paroxysmal atrial fibrillation: Secondary | ICD-10-CM | POA: Diagnosis not present

## 2020-10-15 DIAGNOSIS — Z7901 Long term (current) use of anticoagulants: Secondary | ICD-10-CM | POA: Insufficient documentation

## 2020-10-15 DIAGNOSIS — I4811 Longstanding persistent atrial fibrillation: Secondary | ICD-10-CM | POA: Insufficient documentation

## 2020-10-15 DIAGNOSIS — R0683 Snoring: Secondary | ICD-10-CM | POA: Insufficient documentation

## 2020-10-15 DIAGNOSIS — Z7984 Long term (current) use of oral hypoglycemic drugs: Secondary | ICD-10-CM | POA: Insufficient documentation

## 2020-10-15 DIAGNOSIS — R0681 Apnea, not elsewhere classified: Secondary | ICD-10-CM

## 2020-10-15 DIAGNOSIS — Z79899 Other long term (current) drug therapy: Secondary | ICD-10-CM | POA: Diagnosis not present

## 2020-10-15 DIAGNOSIS — I4819 Other persistent atrial fibrillation: Secondary | ICD-10-CM

## 2020-10-15 MED ORDER — APIXABAN 5 MG PO TABS
5.0000 mg | ORAL_TABLET | Freq: Two times a day (BID) | ORAL | 3 refills | Status: DC
Start: 2020-10-15 — End: 2020-11-14

## 2020-10-15 NOTE — Progress Notes (Signed)
Primary Care Physician: Horald Pollen, MD Primary Cardiologist: none Primary Electrophysiologist: none Referring Physician: Dr Skip Mayer Darius Ingram is a 65 y.o. male with a history of HTN, DM, and atrial fibrillation who presents for consultation in the [facility].  The patient was initially diagnosed with atrial fibrillation 10/11/20 incidentally at the [facility]. ECG showed atrial fibrillation but patient was asymptomatic. Patient has a CHADS2VASC score of 3. He remains in afib today. He denies significant alcohol use but does admit to snoring, witnessed apnea, and daytime somnolence.   Today, he denies symptoms of palpitations, chest pain, shortness of breath, orthopnea, PND, lower extremity edema, dizziness, presyncope, syncope, bleeding, or neurologic sequela. The patient is tolerating medications without difficulties and is otherwise without complaint today.    Atrial Fibrillation Risk Factors:  he does have symptoms or diagnosis of sleep apnea. he does not have a history of rheumatic fever. he does not have a history of alcohol use. The patient does have a history of early familial atrial fibrillation or other arrhythmias. Father had afib.  he has a BMI of Body mass index is 47.17 kg/m.Marland Kitchen Filed Weights   10/15/20 1010  Weight: (!) 157.8 kg    Family History  Problem Relation Age of Onset  . Diabetes Mother   . Obesity Mother   . Diabetes Father   . Hypertension Father   . Heart disease Father   . Obesity Father   . Colon polyps Neg Hx   . Colon cancer Neg Hx   . Esophageal cancer Neg Hx   . Stomach cancer Neg Hx   . Rectal cancer Neg Hx      Atrial Fibrillation Management history:  Previous antiarrhythmic drugs: none Previous cardioversions: none Previous ablations: none CHADS2VASC score: 3 Anticoagulation history: none   Past Medical History:  Diagnosis Date  . Allergy    seasonal  allergies  . Diabetes mellitus without complication ([place])   . Hyperlipidemia   . Hypertension   . Hypothyroidism   . Joint pain   . SOBOE (shortness of breath on exertion)    Past Surgical History:  Procedure Laterality Date  . TONSILECTOMY, ADENOIDECTOMY, BILATERAL MYRINGOTOMY AND TUBES  1960  . TYMPANOSTOMY  1960  . WISDOM TOOTH EXTRACTION  1978    Current Outpatient Medications  Medication Sig Dispense Refill  . acetaminophen (TYLENOL) 325 MG tablet Take 650 mg by mouth every 6 (six) hours as needed.    Marland Kitchen amLODipine (NORVASC) 10 MG tablet Take 1 tablet (10 mg total) by mouth daily. 90 tablet 3  . apixaban (ELIQUIS) 5 MG TABS tablet Take 1 tablet (5 mg total) by mouth 2 (two) times daily. 60 tablet 3  . atorvastatin (LIPITOR) 40 MG tablet Take 1 tablet (40 mg total) by mouth daily. 90 tablet 3  . cetirizine (ZYRTEC) 10 MG tablet Take 1 tablet (10 mg total) by mouth daily. 90 tablet 3  . Dulaglutide (TRULICITY) 1.5 LA/4.5XM SOPN Inject 1.5 mg into the skin once a week. 6 mL 5  . fluticasone (FLONASE) 50 MCG/ACT nasal spray Place 2 sprays into both nostrils daily. 16 g 3  . lisinopril (ZESTRIL) 20 MG tablet Take 1 tablet (20 mg total) by mouth daily. 90 tablet 3  . metFORMIN (GLUCOPHAGE) 1000 MG tablet Take 1 tablet (1,000 mg total) by mouth 2 (two) times daily with a meal. 180 tablet 3  . Multiple Vitamin (MULTIVITAMIN PO) Take 1 tablet by  mouth daily.     No current facility-administered medications for this encounter.    Allergies  Allergen Reactions  . Penicillins Rash    Social History   Socioeconomic History  . Marital status: Single    Spouse name: Not on file  . Number of children: Not on file  . Years of education: Not on file  . Highest education level: Not on file  Occupational History  . Occupation: Retired Biomedical scientist  Tobacco Use  . Smoking status: Former Smoker    Packs/day: 1.00    Years: 10.00    Pack years: 10.00    Types: Cigarettes    Quit date:  12/29/1998    Years since quitting: 21.8  . Smokeless tobacco: Never Used  Vaping Use  . Vaping Use: Never used  Substance and Sexual Activity  . Alcohol use: Yes    Alcohol/week: 1.0 standard drink    Types: 1 Cans of beer per week    Comment: occ  . Drug use: No  . Sexual activity: Never  Other Topics Concern  . Not on file  Social History Narrative  . Not on file   Social Determinants of Health   Financial Resource Strain: Not on file  Food Insecurity: Not on file  Transportation Needs: Not on file  Physical Activity: Not on file  Stress: Not on file  Social Connections: Not on file  Intimate Partner Violence: Not on file     ROS- All systems are reviewed and negative except as per the HPI above.  Physical Exam: Vitals:   10/15/20 1010  BP: 126/82  Pulse: 97  Weight: (!) 157.8 kg  Height: 6' (1.829 m)    GEN- The patient is a well appearing obese male, alert and oriented x 3 today.   Head- normocephalic, atraumatic Eyes-  Sclera clear, conjunctiva pink Ears- hearing intact Oropharynx- clear Neck- supple  Lungs- Clear to ausculation bilaterally, normal work of breathing Heart- irregular rate and rhythm, no murmurs, rubs or gallops  GI- soft, NT, ND, + BS Extremities- no clubbing, cyanosis. Trace bilateral edema MS- no significant deformity or atrophy Skin- no rash or lesion Psych- euthymic mood, full affect Neuro- strength and sensation are intact  Wt Readings from Last 3 Encounters:  10/15/20 (!) 157.8 kg  10/11/20 (!) 153.8 kg  10/02/20 (!) 156 kg    EKG today demonstrates  Afib, RBBB, LAFB Vent. rate 97 BPM PR interval * ms QRS duration 180 ms QT/QTc 420/533 ms  Epic records are reviewed at length today  CHA2DS2-VASc Score = 3  The patient's score is based upon: CHF History: No HTN History: Yes Diabetes History: Yes Stroke History: No Vascular Disease History: No Age Score: 1 Gender Score: 0      ASSESSMENT AND PLAN: 1. Paroxysmal  Atrial Fibrillation (ICD10:  I48.0) The patient's CHA2DS2-VASc score is 3, indicating a 3.2% annual risk of stroke.   General education about afib provided and questions answered. We also discussed his stroke risk and the risks and benefits of anticoagulation. Will stop ASA and start Eliquis 5 mg BID. Can consider DCCV after 3 weeks of uninterrupted anticoagulation.  Check echocardiogram  2. Secondary Hypercoagulable State (ICD10:  D68.69) The patient is at significant risk for stroke/thromboembolism based upon his CHA2DS2-VASc Score of 3.  Start Apixaban (Eliquis).   3. Obesity Body mass index is 47.17 kg/m. Lifestyle modification was discussed at length including regular exercise and weight reduction.  4. Snoring/witnessed apnea/daytimesomnolence  The importance of adequate  treatment of sleep apnea was discussed today in order to improve our ability to maintain sinus rhythm long term. Will refer for sleep study.   5. HTN Stable, no changes today.   Follow up in the AF clinic in 3 weeks.    [facility] [address] [place], [place] [address] [phone] 10/15/2020 2:42 PM

## 2020-10-15 NOTE — Patient Instructions (Signed)
Stop aspirin  Start Eliquis 5 mg twice a day.

## 2020-10-16 ENCOUNTER — Telehealth: Payer: Self-pay | Admitting: *Deleted

## 2020-10-16 NOTE — Telephone Encounter (Signed)
Patient notified of sleep study appointment details.

## 2020-10-25 ENCOUNTER — Other Ambulatory Visit: Payer: Self-pay

## 2020-10-25 ENCOUNTER — Ambulatory Visit (INDEPENDENT_AMBULATORY_CARE_PROVIDER_SITE_OTHER): Payer: Medicare Other | Admitting: Family Medicine

## 2020-10-25 ENCOUNTER — Encounter (INDEPENDENT_AMBULATORY_CARE_PROVIDER_SITE_OTHER): Payer: Self-pay | Admitting: Family Medicine

## 2020-10-25 VITALS — BP 114/75 | HR 54 | Temp 98.0°F | Ht 72.0 in | Wt 338.0 lb

## 2020-10-25 DIAGNOSIS — E1169 Type 2 diabetes mellitus with other specified complication: Secondary | ICD-10-CM | POA: Diagnosis not present

## 2020-10-25 DIAGNOSIS — E785 Hyperlipidemia, unspecified: Secondary | ICD-10-CM

## 2020-10-25 DIAGNOSIS — I152 Hypertension secondary to endocrine disorders: Secondary | ICD-10-CM

## 2020-10-25 DIAGNOSIS — E1159 Type 2 diabetes mellitus with other circulatory complications: Secondary | ICD-10-CM

## 2020-10-25 DIAGNOSIS — E65 Localized adiposity: Secondary | ICD-10-CM | POA: Diagnosis not present

## 2020-10-25 DIAGNOSIS — I4819 Other persistent atrial fibrillation: Secondary | ICD-10-CM

## 2020-10-25 DIAGNOSIS — Z6841 Body Mass Index (BMI) 40.0 and over, adult: Secondary | ICD-10-CM | POA: Diagnosis not present

## 2020-10-29 DIAGNOSIS — E65 Localized adiposity: Secondary | ICD-10-CM | POA: Insufficient documentation

## 2020-10-29 NOTE — Progress Notes (Signed)
Chief Complaint:   OBESITY Darius Ingram is here to discuss his progress with his obesity treatment plan along with follow-up of his obesity related diagnoses.   Today's visit was #: 2 Starting weight: 339 lbs Starting date: 10/11/2020 Today's weight: 338 lbs Today's date: 10/25/2020 Total lbs lost to date: 1 lb Body mass index is 45.84 kg/m.  Total weight loss percentage to date: -0.29%  Interim History:  New Afib. See previous note. Evaluated at Afib clinic. He was referred for a sleep study and an echo.  He is tolerating Eliquis.  He says that the end of the month is the hardest time for him, financially.  He walks to [employer], which is 3/4 mile away.  Today's bioimpedance results indicate that [name] has gained 6 pounds of water weight since his last visit.  [name] provided the following food recall today:  Breakfast:  Cereal or 4 eggs, no bacon, mixed fruit. Lunch:  Apple. Dinner:  Sandwich/fruits.  Current Meal Plan: practicing portion control and making smarter food choices, such as increasing vegetables and decreasing simple carbohydrates for 100% of the time.  Current Exercise Plan: Increased walking. Current Anti-Obesity Medications: Trulicity 1.5 mg subcutaneously weekly. Side effects: None.  Assessment/Plan:   1. Type 2 diabetes mellitus with other specified complication, without long-term current use of insulin (HCC) Diabetes Mellitus: Not at goal. Medication: Trulicity 1.5 mg subcutaneously weekly, metformin 1,000 mg twice daily. Issues reviewed: blood sugar goals, complications of diabetes mellitus, hypoglycemia prevention and treatment, exercise, and nutrition.   Plan: The importance of regular follow up with PCP and all other specialists as scheduled was stressed to patient today.  Lab Results  Component Value Date   HGBA1C 7.0 (A) 10/02/2020   HGBA1C 6.4 (A) 07/04/2020   HGBA1C 6.9 (A) 04/03/2020   Lab Results  Component Value Date   LDLCALC 93 10/02/2020    CREATININE 0.92 04/03/2020   2. Hypertension associated with type 2 diabetes mellitus ([place]) At goal. Medications: Norvasc 10 mg daily, lisinopril 20 mg daily.   Plan: Avoid buying foods that are: processed, frozen, or prepackaged to avoid excess salt. We will watch for signs of hypotension as he continues lifestyle modifications. We will continue to monitor closely alongside his PCP and/or Specialist.    BP Readings from Last 3 Encounters:  10/25/20 114/75  10/15/20 126/82  10/11/20 137/78   Lab Results  Component Value Date   CREATININE 0.92 04/03/2020   3. Hyperlipidemia associated with type 2 diabetes mellitus ([place]) Course: Not optimized. Lipid-lowering medications: Lipitor 40 mg daily.   Plan: Dietary changes: Increase soluble fiber, decrease simple carbohydrates, decrease saturated fat. Exercise changes: Moderate to vigorous-intensity aerobic activity 150 minutes per week or as tolerated. We will continue to monitor along with PCP/specialists as it pertains to his weight loss journey.  Lab Results  Component Value Date   CHOL 152 10/02/2020   HDL 37 (L) 10/02/2020   LDLCALC 93 10/02/2020   TRIG 123 10/02/2020   CHOLHDL 4.1 10/02/2020   Lab Results  Component Value Date   ALT 16 04/03/2020   AST 16 04/03/2020   ALKPHOS 85 04/03/2020   BILITOT 0.5 04/03/2020   The 10-year ASCVD risk score Mikey Bussing DC Jr., et al., 2013) is: 21.8%   Values used to calculate the score:     Age: 63 years     Sex: Male     Is Non-Hispanic African American: No     Diabetic: Yes  Tobacco smoker: No     Systolic Blood Pressure: 546 mmHg     Is BP treated: Yes     HDL Cholesterol: 37 mg/dL     Total Cholesterol: 152 mg/dL  4. Persistent atrial fibrillation (HCC) Bright is taking Eliquis 5 mg twice daily and tolerating it well. We will continue to monitor symptoms as they relate to his weight loss journey.  5. Visceral obesity Current visceral fat rating: 32. Visceral fat rating should  be < 13. Visceral adipose tissue is a hormonally active component of total body fat. This body composition phenotype is associated with medical disorders such as metabolic syndrome, cardiovascular disease and several malignancies including prostate, breast, and colorectal cancers. Starting goal: Lose 7-10% of starting weight.   6. Obesity, current BMI 45.9  Course: Kavari is currently in the action stage of change. As such, his goal is to continue with weight loss efforts.   Nutrition goals: He has agreed to practicing portion control and making smarter food choices, such as increasing vegetables and decreasing simple carbohydrates.  Add protein at lunch.  Exercise goals: As is.  Behavioral modification strategies: increasing lean protein intake, decreasing simple carbohydrates, increasing vegetables, increasing water intake and decreasing liquid calories.  [name] has agreed to follow-up with our clinic in 3 weeks. He was informed of the importance of frequent follow-up visits to maximize his success with intensive lifestyle modifications for his multiple health conditions.   Objective:   Blood pressure 114/75, pulse (!) 54, temperature 98 F (36.7 C), temperature source Oral, height 6' (1.829 m), weight (!) 338 lb (153.3 kg), SpO2 96 %. Body mass index is 45.84 kg/m.  General: Cooperative, alert, well developed, in no acute distress. HEENT: Conjunctivae and lids unremarkable. Cardiovascular: Regular rhythm.  Lungs: Normal work of breathing. Neurologic: No focal deficits.   Lab Results  Component Value Date   CREATININE 0.92 04/03/2020   BUN 16 04/03/2020   NA 144 04/03/2020   K 3.9 04/03/2020   CL 105 04/03/2020   CO2 26 04/03/2020   Lab Results  Component Value Date   ALT 16 04/03/2020   AST 16 04/03/2020   ALKPHOS 85 04/03/2020   BILITOT 0.5 04/03/2020   Lab Results  Component Value Date   HGBA1C 7.0 (A) 10/02/2020   HGBA1C 6.4 (A) 07/04/2020   HGBA1C 6.9 (A)  04/03/2020   HGBA1C 9.9 (A) 12/29/2019   HGBA1C 7.4 (A) 04/20/2018   Lab Results  Component Value Date   CHOL 152 10/02/2020   HDL 37 (L) 10/02/2020   LDLCALC 93 10/02/2020   TRIG 123 10/02/2020   CHOLHDL 4.1 10/02/2020   Lab Results  Component Value Date   WBC 11.7 (H) 04/03/2020   HGB 14.4 04/03/2020   HCT 40.8 04/03/2020   MCV 86 04/03/2020   PLT 257 04/03/2020   Obesity Behavioral Intervention:   Approximately 15 minutes were spent on the discussion below.  ASK: We discussed the diagnosis of obesity with Marcello Moores today and Gurkaran agreed to give [place] permission to discuss obesity behavioral modification therapy today.  ASSESS: Beckham has the diagnosis of obesity and his BMI today is 45.9. Arrian is in the action stage of change.   ADVISE: Harjit was educated on the multiple health risks of obesity as well as the benefit of weight loss to improve his health. He was advised of the need for long term treatment and the importance of lifestyle modifications to improve his current health and to decrease his risk of  future health problems.  AGREE: Multiple dietary modification options and treatment options were discussed and Hrishikesh agreed to follow the recommendations documented in the above note.  ARRANGE: Amil was educated on the importance of frequent visits to treat obesity as outlined per CMS and USPSTF guidelines and agreed to schedule his next follow up appointment today.  Attestation Statements:   Reviewed by clinician on day of visit: allergies, medications, problem list, medical history, surgical history, family history, social history, and previous encounter notes.  I, Water quality scientist, CMA, am acting as transcriptionist for Briscoe Deutscher, DO  I have reviewed the above documentation for accuracy and completeness, and I agree with the above. Briscoe Deutscher, DO

## 2020-11-06 ENCOUNTER — Ambulatory Visit (HOSPITAL_COMMUNITY)
Admission: RE | Admit: 2020-11-06 | Discharge: 2020-11-06 | Disposition: A | Discharge status: Home / Self Care | Payer: Medicare Other | Source: Ambulatory Visit | Attending: Physician Assistant | Admitting: Physician Assistant

## 2020-11-06 ENCOUNTER — Encounter (HOSPITAL_COMMUNITY): Payer: Self-pay | Admitting: Physician Assistant

## 2020-11-06 ENCOUNTER — Other Ambulatory Visit: Payer: Self-pay

## 2020-11-06 VITALS — BP 116/78 | HR 90 | Ht 72.0 in | Wt 344.0 lb

## 2020-11-06 DIAGNOSIS — Z87891 Personal history of nicotine dependence: Secondary | ICD-10-CM | POA: Insufficient documentation

## 2020-11-06 DIAGNOSIS — I4819 Other persistent atrial fibrillation: Secondary | ICD-10-CM | POA: Diagnosis not present

## 2020-11-06 DIAGNOSIS — Z7901 Long term (current) use of anticoagulants: Secondary | ICD-10-CM | POA: Insufficient documentation

## 2020-11-06 DIAGNOSIS — I10 Essential (primary) hypertension: Secondary | ICD-10-CM | POA: Diagnosis not present

## 2020-11-06 DIAGNOSIS — E669 Obesity, unspecified: Secondary | ICD-10-CM | POA: Insufficient documentation

## 2020-11-06 DIAGNOSIS — I4510 Unspecified right bundle-branch block: Secondary | ICD-10-CM | POA: Diagnosis not present

## 2020-11-06 DIAGNOSIS — Z79899 Other long term (current) drug therapy: Secondary | ICD-10-CM | POA: Diagnosis not present

## 2020-11-06 DIAGNOSIS — Z6841 Body Mass Index (BMI) 40.0 and over, adult: Secondary | ICD-10-CM | POA: Diagnosis not present

## 2020-11-06 DIAGNOSIS — G4730 Sleep apnea, unspecified: Secondary | ICD-10-CM | POA: Insufficient documentation

## 2020-11-06 DIAGNOSIS — D6869 Other thrombophilia: Secondary | ICD-10-CM | POA: Insufficient documentation

## 2020-11-06 NOTE — Progress Notes (Signed)
Primary Care Physician: Horald Pollen, MD Primary Cardiologist: none Primary Electrophysiologist: none Referring Physician: Dr Skip Mayer Rooke is a 65 y.o. male with a history of HTN, DM, and atrial fibrillation who presents for follow up in the [facility]. The patient was initially diagnosed with atrial fibrillation 10/11/20 incidentally at [facility]. ECG showed atrial fibrillation but patient was asymptomatic. Patient has a CHADS2VASC score of 3. He denies significant alcohol use but does admit to snoring, witnessed apnea, and daytime somnolence.   On follow up today, patient reports he has done well since his last visit. He is unaware of his arrhythmia. He denies any bleeding issues on anticoagulation. He has a sleep study scheduled for 12/14/20.  Today, he denies symptoms of palpitations, chest pain, shortness of breath, orthopnea, PND, lower extremity edema, dizziness, presyncope, syncope, bleeding, or neurologic sequela. The patient is tolerating medications without difficulties and is otherwise without complaint today.    Atrial Fibrillation Risk Factors:  he does have symptoms or diagnosis of sleep apnea. he does not have a history of rheumatic fever. he does not have a history of alcohol use. The patient does have a history of early familial atrial fibrillation or other arrhythmias. Father had afib.  he has a BMI of Body mass index is 46.65 kg/m.Marland Kitchen Filed Weights   11/06/20 0925  Weight: (!) 156 kg    Family History  Problem Relation Age of Onset  . Diabetes Mother   . Obesity Mother   . Diabetes Father   . Hypertension Father   . Heart disease Father   . Obesity Father   . Colon polyps Neg Hx   . Colon cancer Neg Hx   . Esophageal cancer Neg Hx   . Stomach cancer Neg Hx   . Rectal cancer Neg Hx      Atrial Fibrillation Management history:  Previous antiarrhythmic drugs:  none Previous cardioversions: none Previous ablations: none CHADS2VASC score: 3 Anticoagulation history: Eliquis   Past Medical History:  Diagnosis Date  . Allergy    seasonal allergies  . Diabetes mellitus without complication ([place])   . Hyperlipidemia   . Hypertension   . Hypothyroidism   . Joint pain   . SOBOE (shortness of breath on exertion)    Past Surgical History:  Procedure Laterality Date  . TONSILECTOMY, ADENOIDECTOMY, BILATERAL MYRINGOTOMY AND TUBES  1960  . TYMPANOSTOMY  1960  . WISDOM TOOTH EXTRACTION  1978    Current Outpatient Medications  Medication Sig Dispense Refill  . acetaminophen (TYLENOL) 325 MG tablet Take 650 mg by mouth every 6 (six) hours as needed.    Marland Kitchen amLODipine (NORVASC) 10 MG tablet Take 1 tablet (10 mg total) by mouth daily. 90 tablet 3  . apixaban (ELIQUIS) 5 MG TABS tablet Take 1 tablet (5 mg total) by mouth 2 (two) times daily. 60 tablet 3  . atorvastatin (LIPITOR) 40 MG tablet Take 1 tablet (40 mg total) by mouth daily. 90 tablet 3  . cetirizine (ZYRTEC) 10 MG tablet Take 1 tablet (10 mg total) by mouth daily. 90 tablet 3  . Dulaglutide (TRULICITY) 1.5 JO/8.4ZY SOPN Inject 1.5 mg into the skin once a week. 6 mL 5  . fluticasone (FLONASE) 50 MCG/ACT nasal spray Place 2 sprays into both nostrils daily. 16 g 3  . lisinopril (ZESTRIL) 20 MG tablet Take 1 tablet (20 mg total) by mouth daily. 90 tablet 3  . metFORMIN (GLUCOPHAGE)  1000 MG tablet Take 1 tablet (1,000 mg total) by mouth 2 (two) times daily with a meal. 180 tablet 3  . Multiple Vitamin (MULTIVITAMIN PO) Take 1 tablet by mouth daily.     No current facility-administered medications for this encounter.    Allergies  Allergen Reactions  . Penicillins Rash    Social History   Socioeconomic History  . Marital status: Single    Spouse name: Not on file  . Number of children: Not on file  . Years of education: Not on file  . Highest education level: Not on file  Occupational  History  . Occupation: Retired Biomedical scientist  Tobacco Use  . Smoking status: Former Smoker    Packs/day: 1.00    Years: 10.00    Pack years: 10.00    Types: Cigarettes    Quit date: 12/29/1998    Years since quitting: 21.8  . Smokeless tobacco: Never Used  Vaping Use  . Vaping Use: Never used  Substance and Sexual Activity  . Alcohol use: Yes    Alcohol/week: 1.0 standard drink    Types: 1 Cans of beer per week    Comment: occ  . Drug use: No  . Sexual activity: Never  Other Topics Concern  . Not on file  Social History Narrative  . Not on file   Social Determinants of Health   Financial Resource Strain: Not on file  Food Insecurity: Not on file  Transportation Needs: Not on file  Physical Activity: Not on file  Stress: Not on file  Social Connections: Not on file  Intimate Partner Violence: Not on file     ROS- All systems are reviewed and negative except as per the HPI above.  Physical Exam: Vitals:   11/06/20 0925  BP: 116/78  Pulse: 90  Weight: (!) 156 kg  Height: 6' (1.829 m)    GEN- The patient is a well appearing obese male, alert and oriented x 3 today.   HEENT-head normocephalic, atraumatic, sclera clear, conjunctiva pink, hearing intact, trachea midline. Lungs- Clear to ausculation bilaterally, normal work of breathing Heart- irregular rate and rhythm, no murmurs, rubs or gallops  GI- soft, NT, ND, + BS Extremities- no clubbing, cyanosis, or edema MS- no significant deformity or atrophy Skin- no rash or lesion Psych- euthymic mood, full affect Neuro- strength and sensation are intact   Wt Readings from Last 3 Encounters:  11/06/20 (!) 156 kg  10/25/20 (!) 153.3 kg  10/15/20 (!) 157.8 kg    EKG today demonstrates  Afib, RBBB, LAFB Vent. rate 90 BPM PR interval * ms QRS duration 180 ms QT/QTcB 420/513 ms  Epic records are reviewed at length today  CHA2DS2-VASc Score = 3  The patient's score is based upon: CHF History: No HTN History:  Yes Diabetes History: Yes Stroke History: No Vascular Disease History: No Age Score: 1 Gender Score: 0      ASSESSMENT AND PLAN: 1. Persistent Atrial Fibrillation The patient's CHA2DS2-VASc score is 3, indicating a 3.2% annual risk of stroke.   Patient remains in rate controlled afib.  Continue Eliquis 5 mg BID Will attempt a trial of SR. Patient to call clinic with availability for DCCV. Echocardiogram scheduled.   2. Secondary Hypercoagulable State (ICD10:  D68.69) The patient is at significant risk for stroke/thromboembolism based upon his CHA2DS2-VASc Score of 3.  Continue Apixaban (Eliquis).   3. Obesity Body mass index is 46.65 kg/m. Lifestyle modification was discussed and encouraged including regular physical activity and weight  reduction. [facility].  4. Snoring/witnessed apnea/daytimesomnolence  Sleep study scheduled for 5/20.   5. HTN Stable, no changes today.   Follow up in the AF clinic post DCCV.    [facility] [address] [place], [place] [address] [phone] 11/06/2020 9:33 AM

## 2020-11-12 ENCOUNTER — Other Ambulatory Visit (HOSPITAL_COMMUNITY): Payer: Self-pay | Admitting: *Deleted

## 2020-11-14 ENCOUNTER — Ambulatory Visit (HOSPITAL_COMMUNITY)
Admission: RE | Admit: 2020-11-14 | Discharge: 2020-11-14 | Disposition: A | Discharge status: Home / Self Care | Payer: Medicare Other | Source: Ambulatory Visit | Attending: Physician Assistant | Admitting: Physician Assistant

## 2020-11-14 ENCOUNTER — Encounter (INDEPENDENT_AMBULATORY_CARE_PROVIDER_SITE_OTHER): Payer: Self-pay | Admitting: Family Medicine

## 2020-11-14 ENCOUNTER — Other Ambulatory Visit: Payer: Self-pay

## 2020-11-14 ENCOUNTER — Ambulatory Visit (INDEPENDENT_AMBULATORY_CARE_PROVIDER_SITE_OTHER): Payer: Medicare Other | Admitting: Family Medicine

## 2020-11-14 VITALS — BP 117/66 | HR 72 | Temp 97.8°F | Ht 72.0 in | Wt 339.0 lb

## 2020-11-14 DIAGNOSIS — E1169 Type 2 diabetes mellitus with other specified complication: Secondary | ICD-10-CM

## 2020-11-14 DIAGNOSIS — I4819 Other persistent atrial fibrillation: Secondary | ICD-10-CM | POA: Diagnosis not present

## 2020-11-14 DIAGNOSIS — E785 Hyperlipidemia, unspecified: Secondary | ICD-10-CM | POA: Insufficient documentation

## 2020-11-14 DIAGNOSIS — I10 Essential (primary) hypertension: Secondary | ICD-10-CM | POA: Insufficient documentation

## 2020-11-14 DIAGNOSIS — R9431 Abnormal electrocardiogram [ECG] [EKG]: Secondary | ICD-10-CM | POA: Insufficient documentation

## 2020-11-14 DIAGNOSIS — E119 Type 2 diabetes mellitus without complications: Secondary | ICD-10-CM | POA: Insufficient documentation

## 2020-11-14 DIAGNOSIS — Z6841 Body Mass Index (BMI) 40.0 and over, adult: Secondary | ICD-10-CM

## 2020-11-14 LAB — CBC
HCT: 47.1 % (ref 39.0–52.0)
Hemoglobin: 15.5 g/dL (ref 13.0–17.0)
MCH: 29.3 pg (ref 26.0–34.0)
MCHC: 32.9 g/dL (ref 30.0–36.0)
MCV: 89 fL (ref 80.0–100.0)
Platelets: 281 10*3/uL (ref 150–400)
RBC: 5.29 MIL/uL (ref 4.22–5.81)
RDW: 14.3 % (ref 11.5–15.5)
WBC: 11.2 10*3/uL — ABNORMAL HIGH (ref 4.0–10.5)
nRBC: 0 % (ref 0.0–0.2)

## 2020-11-14 LAB — BASIC METABOLIC PANEL
Anion gap: 8 (ref 5–15)
BUN: 21 mg/dL (ref 8–23)
CO2: 25 mmol/L (ref 22–32)
Calcium: 8.5 mg/dL — ABNORMAL LOW (ref 8.9–10.3)
Chloride: 106 mmol/L (ref 98–111)
Creatinine, Ser: 1.03 mg/dL (ref 0.61–1.24)
GFR, Estimated: >60 mL/min (ref >60)
Glucose, Bld: 237 mg/dL — ABNORMAL HIGH (ref 70–99)
Potassium: 3.5 mmol/L (ref 3.5–5.1)
Sodium: 139 mmol/L (ref 135–145)

## 2020-11-14 MED ORDER — ELIQUIS 5 MG PO TABS: 5.0000 mg | ORAL_TABLET | Freq: Two times a day (BID) | ORAL | 3 refills | Status: DC

## 2020-11-14 NOTE — Progress Notes (Signed)
  Echocardiogram 2D Echocardiogram has been performed.  Darius Ingram 11/14/2020, 9:55 AM

## 2020-11-15 ENCOUNTER — Encounter (INDEPENDENT_AMBULATORY_CARE_PROVIDER_SITE_OTHER): Payer: Self-pay | Admitting: Family Medicine

## 2020-11-15 ENCOUNTER — Other Ambulatory Visit (HOSPITAL_COMMUNITY): Payer: Self-pay | Admitting: *Deleted

## 2020-11-15 DIAGNOSIS — Z6841 Body Mass Index (BMI) 40.0 and over, adult: Secondary | ICD-10-CM | POA: Insufficient documentation

## 2020-11-15 DIAGNOSIS — I517 Cardiomegaly: Secondary | ICD-10-CM

## 2020-11-15 NOTE — Progress Notes (Signed)
Chief Complaint:   OBESITY Darius Ingram is here to discuss his progress with his obesity treatment plan along with follow-up of his obesity related diagnoses. Darius Ingram is on practicing portion control and making smarter food choices, such as increasing vegetables and decreasing simple carbohydrates and states he is following his eating plan approximately 80% of the time. Darius Ingram states he is not exercising regularly.  Today's visit was #: 3 Starting weight: 339 lbs Starting date: 10/11/2020 Today's weight: 339 lbs Today's date: 11/14/2020 Total lbs lost to date: 0 Total lbs lost since last in-office visit: 0  Interim History: Darius Ingram says he is  focusing on protein intake.  He eats tuna packets for protein.  He also has sandwiches, chicken, 96/4 ground beef.  He is snacking on popcorn.  He likes fruit.  He does not eat vegetables.  He drinks no sugar sweetened beverages.  He does not have a car so he shops at [employer] because he can walk there.  He cut out ice cream.  He admits to indulging in chocolate however.   Subjective:   1. Type 2 diabetes mellitus with other specified complication, without long-term current use of insulin (HCC) A1c is 7.0.  Reasonably well controlled.  On Trulicity and metformin.  He will be increasing dose of Trulicity to 1.5 (PCP).  Lab Results  Component Value Date   HGBA1C 7.0 (A) 10/02/2020   HGBA1C 6.4 (A) 07/04/2020   HGBA1C 6.9 (A) 04/03/2020   Lab Results  Component Value Date   LDLCALC 93 10/02/2020   CREATININE 1.03 11/14/2020   Assessment/Plan:   1. Type 2 diabetes mellitus with other specified complication, without long-term current use of insulin (HCC) Continue metformin and Trulicity.   2. Obesity, current BMI 45.97  Darius Ingram is currently in the action stage of change. As such, his goal is to continue with weight loss efforts. He has agreed to practicing portion control and making smarter food choices, such as increasing vegetables and  decreasing simple carbohydrates.   Handout provided:  Protein content of food.  Exercise goals: No exercise has been prescribed at this time.  Behavioral modification strategies: increasing lean protein intake and better snacking choices.  Darius Ingram has agreed to follow-up with our clinic in 3 weeks with Dr. Juleen China and 6 weeks with me.   Objective:   Blood pressure 117/66, pulse 72, temperature 97.8 F (36.6 C), height 6' (1.829 m), weight (!) 339 lb (153.8 kg), SpO2 96 %. Body mass index is 45.98 kg/m.  General: Cooperative, alert, well developed, in no acute distress. HEENT: Conjunctivae and lids unremarkable. Cardiovascular: Regular rhythm.  Lungs: Normal work of breathing. Neurologic: No focal deficits.   Lab Results  Component Value Date   CREATININE 1.03 11/14/2020   BUN 21 11/14/2020   NA 139 11/14/2020   K 3.5 11/14/2020   CL 106 11/14/2020   CO2 25 11/14/2020   Lab Results  Component Value Date   ALT 16 04/03/2020   AST 16 04/03/2020   ALKPHOS 85 04/03/2020   BILITOT 0.5 04/03/2020   Lab Results  Component Value Date   HGBA1C 7.0 (A) 10/02/2020   HGBA1C 6.4 (A) 07/04/2020   HGBA1C 6.9 (A) 04/03/2020   HGBA1C 9.9 (A) 12/29/2019   HGBA1C 7.4 (A) 04/20/2018   Lab Results  Component Value Date   CHOL 152 10/02/2020   HDL 37 (L) 10/02/2020   LDLCALC 93 10/02/2020   TRIG 123 10/02/2020   CHOLHDL 4.1 10/02/2020   Lab  Results  Component Value Date   WBC 11.2 (H) 11/14/2020   HGB 15.5 11/14/2020   HCT 47.1 11/14/2020   MCV 89.0 11/14/2020   PLT 281 11/14/2020   Obesity Behavioral Intervention:   Approximately 15 minutes were spent on the discussion below.  ASK: We discussed the diagnosis of obesity with Darius Ingram today and Darius Ingram agreed to give [place] permission to discuss obesity behavioral modification therapy today.  ASSESS: Redell has the diagnosis of obesity and his BMI today is 46.1. Darius Ingram is in the action stage of change.   ADVISE: Darius Ingram was  educated on the multiple health risks of obesity as well as the benefit of weight loss to improve his health. He was advised of the need for long term treatment and the importance of lifestyle modifications to improve his current health and to decrease his risk of future health problems.  AGREE: Multiple dietary modification options and treatment options were discussed and Darius Ingram agreed to follow the recommendations documented in the above note.  ARRANGE: Darius Ingram was educated on the importance of frequent visits to treat obesity as outlined per CMS and USPSTF guidelines and agreed to schedule his next follow up appointment today.  Attestation Statements:   Reviewed by clinician on day of visit: allergies, medications, problem list, medical history, surgical history, family history, social history, and previous encounter notes.  I, Water quality scientist, CMA, am acting as Location manager for [employer], [place].  I have reviewed the above documentation for accuracy and completeness, and I agree with the above. -  Georgianne Fick, FNP

## 2020-11-16 ENCOUNTER — Other Ambulatory Visit (HOSPITAL_COMMUNITY)
Admission: RE | Admit: 2020-11-16 | Discharge: 2020-11-16 | Disposition: A | Discharge status: Home / Self Care | Payer: Medicare Other | Source: Ambulatory Visit | Attending: Cardiology | Admitting: Cardiology

## 2020-11-16 DIAGNOSIS — Z20822 Contact with and (suspected) exposure to covid-19: Secondary | ICD-10-CM | POA: Diagnosis not present

## 2020-11-16 DIAGNOSIS — Z01812 Encounter for preprocedural laboratory examination: Secondary | ICD-10-CM | POA: Diagnosis not present

## 2020-11-17 LAB — SARS CORONAVIRUS 2 (TAT 6-24 HRS): SARS Coronavirus 2: NEGATIVE

## 2020-11-20 ENCOUNTER — Ambulatory Visit (HOSPITAL_COMMUNITY)
Admission: RE | Admit: 2020-11-20 | Discharge: 2020-11-20 | Disposition: A | Discharge status: Home / Self Care | Payer: Medicare Other | Attending: Cardiology | Admitting: Cardiology

## 2020-11-20 ENCOUNTER — Encounter (HOSPITAL_COMMUNITY)
Admission: RE | Disposition: A | Discharge status: Home / Self Care | Payer: Self-pay | Source: Home / Self Care | Attending: Cardiology

## 2020-11-20 DIAGNOSIS — Z538 Procedure and treatment not carried out for other reasons: Secondary | ICD-10-CM | POA: Insufficient documentation

## 2020-11-20 DIAGNOSIS — I4891 Unspecified atrial fibrillation: Secondary | ICD-10-CM | POA: Diagnosis not present

## 2020-11-20 DIAGNOSIS — I4819 Other persistent atrial fibrillation: Secondary | ICD-10-CM

## 2020-11-20 SURGERY — CANCELLED PROCEDURE

## 2020-11-20 NOTE — H&P (Signed)
Presented for cardioversion for Afib.  Reports he has been off his Eliquis, last dose on  4/23 as has been unable to afford. Cardioversion cancelled.  Will send message to AF clinic concerning resources for patient to get his Eliquis.

## 2020-11-20 NOTE — Progress Notes (Signed)
Per patient, his last dose of eliquis was Sunday morning, 11/18/20.  Dr Gardiner Rhyme notified.

## 2020-12-05 ENCOUNTER — Ambulatory Visit (INDEPENDENT_AMBULATORY_CARE_PROVIDER_SITE_OTHER): Payer: Medicare Other | Admitting: Family Medicine

## 2020-12-05 ENCOUNTER — Other Ambulatory Visit: Payer: Self-pay

## 2020-12-05 ENCOUNTER — Encounter (INDEPENDENT_AMBULATORY_CARE_PROVIDER_SITE_OTHER): Payer: Self-pay | Admitting: Family Medicine

## 2020-12-05 VITALS — BP 107/56 | HR 50 | Temp 98.0°F | Ht 72.0 in | Wt 336.0 lb

## 2020-12-05 DIAGNOSIS — I10 Essential (primary) hypertension: Secondary | ICD-10-CM | POA: Diagnosis not present

## 2020-12-05 DIAGNOSIS — E1169 Type 2 diabetes mellitus with other specified complication: Secondary | ICD-10-CM | POA: Diagnosis not present

## 2020-12-05 DIAGNOSIS — Z6841 Body Mass Index (BMI) 40.0 and over, adult: Secondary | ICD-10-CM

## 2020-12-05 DIAGNOSIS — I4819 Other persistent atrial fibrillation: Secondary | ICD-10-CM

## 2020-12-05 DIAGNOSIS — M1711 Unilateral primary osteoarthritis, right knee: Secondary | ICD-10-CM

## 2020-12-06 ENCOUNTER — Ambulatory Visit (HOSPITAL_COMMUNITY)
Admission: RE | Admit: 2020-12-06 | Discharge: 2020-12-06 | Disposition: A | Discharge status: Home / Self Care | Payer: Medicare Other | Source: Ambulatory Visit | Attending: Physician Assistant | Admitting: Physician Assistant

## 2020-12-06 ENCOUNTER — Encounter (HOSPITAL_COMMUNITY): Payer: Self-pay | Admitting: Physician Assistant

## 2020-12-06 VITALS — BP 112/70 | HR 92 | Ht 72.0 in | Wt 342.0 lb

## 2020-12-06 DIAGNOSIS — Z8249 Family history of ischemic heart disease and other diseases of the circulatory system: Secondary | ICD-10-CM | POA: Diagnosis not present

## 2020-12-06 DIAGNOSIS — Z7901 Long term (current) use of anticoagulants: Secondary | ICD-10-CM | POA: Insufficient documentation

## 2020-12-06 DIAGNOSIS — E669 Obesity, unspecified: Secondary | ICD-10-CM | POA: Insufficient documentation

## 2020-12-06 DIAGNOSIS — Z79899 Other long term (current) drug therapy: Secondary | ICD-10-CM | POA: Insufficient documentation

## 2020-12-06 DIAGNOSIS — D6869 Other thrombophilia: Secondary | ICD-10-CM | POA: Diagnosis not present

## 2020-12-06 DIAGNOSIS — Z87891 Personal history of nicotine dependence: Secondary | ICD-10-CM | POA: Diagnosis not present

## 2020-12-06 DIAGNOSIS — I10 Essential (primary) hypertension: Secondary | ICD-10-CM | POA: Insufficient documentation

## 2020-12-06 DIAGNOSIS — Z6841 Body Mass Index (BMI) 40.0 and over, adult: Secondary | ICD-10-CM | POA: Insufficient documentation

## 2020-12-06 DIAGNOSIS — I4819 Other persistent atrial fibrillation: Secondary | ICD-10-CM | POA: Diagnosis not present

## 2020-12-06 NOTE — Progress Notes (Signed)
Primary Care Physician: Horald Pollen, MD Primary Cardiologist: Dr Sallyanne Kuster (new) Primary Electrophysiologist: none Referring Physician: Dr Skip Mayer Darius Ingram is a 65 y.o. male with a history of HTN, DM, LVH, systolic dysfunction, and atrial fibrillation who presents for follow up in the [facility]. The patient was initially diagnosed with atrial fibrillation 10/11/20 incidentally at the [facility]. ECG showed atrial fibrillation but patient was asymptomatic. Patient has a CHADS2VASC score of 3. He denies significant alcohol use but does admit to snoring, witnessed apnea, and daytime somnolence.   On follow up today, patient did not have DCCV because he had been off Eliquis stating he could not afford the medication. He has a sleep study scheduled for 12/14/20. Overall, he says he feels "great" and is able to walk several miles in a day without issue. He is back on anticoagulation without bleeding issues. Echo showed EF 45-50%, inferior basal hypokinesis, severe LVH.  Today, he denies symptoms of palpitations, chest pain, shortness of breath, orthopnea, PND, lower extremity edema, dizziness, presyncope, syncope, bleeding, or neurologic sequela. The patient is tolerating medications without difficulties and is otherwise without complaint today.    Atrial Fibrillation Risk Factors:  he does have symptoms or diagnosis of sleep apnea. he does not have a history of rheumatic fever. he does not have a history of alcohol use. The patient does have a history of early familial atrial fibrillation or other arrhythmias. Father had afib.  he has a BMI of Body mass index is 46.38 kg/m.Marland Kitchen Filed Weights   12/06/20 1326  Weight: (!) 155.1 kg    Family History  Problem Relation Age of Onset  . Diabetes Mother   . Obesity Mother   . Diabetes Father   . Hypertension Father   . Heart disease Father   . Obesity Father   . Colon  polyps Neg Hx   . Colon cancer Neg Hx   . Esophageal cancer Neg Hx   . Stomach cancer Neg Hx   . Rectal cancer Neg Hx      Atrial Fibrillation Management history:  Previous antiarrhythmic drugs: none Previous cardioversions: none Previous ablations: none CHADS2VASC score: 3 Anticoagulation history: Eliquis   Past Medical History:  Diagnosis Date  . Allergy    seasonal allergies  . Diabetes mellitus without complication ([place])   . Hyperlipidemia   . Hypertension   . Hypothyroidism   . Joint pain   . SOBOE (shortness of breath on exertion)    Past Surgical History:  Procedure Laterality Date  . TONSILECTOMY, ADENOIDECTOMY, BILATERAL MYRINGOTOMY AND TUBES  1960  . TYMPANOSTOMY  1960  . WISDOM TOOTH EXTRACTION  1978    Current Outpatient Medications  Medication Sig Dispense Refill  . acetaminophen (TYLENOL) 325 MG tablet Take 650 mg by mouth every 6 (six) hours as needed for moderate pain.    Marland Kitchen amLODipine (NORVASC) 10 MG tablet Take 1 tablet (10 mg total) by mouth daily. 90 tablet 3  . apixaban (ELIQUIS) 5 MG TABS tablet Take 1 tablet (5 mg total) by mouth 2 (two) times daily. 60 tablet 3  . atorvastatin (LIPITOR) 40 MG tablet Take 1 tablet (40 mg total) by mouth daily. 90 tablet 3  . calcium carbonate (TUMS EX) 750 MG chewable tablet Chew 750-1,500 mg by mouth daily as needed for heartburn.    . cetirizine (ZYRTEC) 10 MG tablet Take 1 tablet (10 mg total) by mouth daily. 90 tablet 3  .  Dulaglutide (TRULICITY) 4.09 BD/5.3GD SOPN Inject 0.75 mg into the skin every Saturday.    . Dulaglutide (TRULICITY) 1.5 JM/4.2AS SOPN Inject 1.5 mg into the skin once a week. 6 mL 5  . fluticasone (FLONASE) 50 MCG/ACT nasal spray Place 2 sprays into both nostrils daily. 16 g 3  . lisinopril (ZESTRIL) 20 MG tablet Take 1 tablet (20 mg total) by mouth daily. 90 tablet 3  . metFORMIN (GLUCOPHAGE) 1000 MG tablet Take 1 tablet (1,000 mg total) by mouth 2 (two) times daily with a meal. 180 tablet  3  . Multiple Vitamin (MULTIVITAMIN PO) Take 1 tablet by mouth daily.     No current facility-administered medications for this encounter.    Allergies  Allergen Reactions  . Penicillins Rash    Social History   Socioeconomic History  . Marital status: Single    Spouse name: Not on file  . Number of children: Not on file  . Years of education: Not on file  . Highest education level: Not on file  Occupational History  . Occupation: Retired Biomedical scientist  Tobacco Use  . Smoking status: Former Smoker    Packs/day: 1.00    Years: 10.00    Pack years: 10.00    Types: Cigarettes    Quit date: 12/29/1998    Years since quitting: 21.9  . Smokeless tobacco: Never Used  Vaping Use  . Vaping Use: Never used  Substance and Sexual Activity  . Alcohol use: Yes    Alcohol/week: 1.0 standard drink    Types: 1 Cans of beer per week    Comment: occ  . Drug use: No  . Sexual activity: Never  Other Topics Concern  . Not on file  Social History Narrative  . Not on file   Social Determinants of Health   Financial Resource Strain: Not on file  Food Insecurity: Not on file  Transportation Needs: Not on file  Physical Activity: Not on file  Stress: Not on file  Social Connections: Not on file  Intimate Partner Violence: Not on file     ROS- All systems are reviewed and negative except as per the HPI above.  Physical Exam: Vitals:   12/06/20 1326  BP: 112/70  Pulse: 92  Weight: (!) 155.1 kg  Height: 6' (1.829 m)    GEN- The patient is a well appearing obese male, alert and oriented x 3 today.   HEENT-head normocephalic, atraumatic, sclera clear, conjunctiva pink, hearing intact, trachea midline. Lungs- Clear to ausculation bilaterally, normal work of breathing Heart- irregular rate and rhythm, no murmurs, rubs or gallops  GI- soft, NT, ND, + BS Extremities- no clubbing, cyanosis, or edema MS- no significant deformity or atrophy Skin- no rash or lesion Psych- euthymic mood, full  affect Neuro- strength and sensation are intact   Wt Readings from Last 3 Encounters:  12/06/20 (!) 155.1 kg  12/05/20 (!) 152.4 kg  11/14/20 (!) 153.8 kg    EKG today demonstrates  Afib, RBBB, LAFB Vent. rate 92 BPM PR interval * ms QRS duration 176 ms QT/QTcB 420/519 ms   Echo 11/14/20 1. Abnormal septal motion inferior basal hypokinesis Abnormal GLS -13.3 .  Left ventricular ejection fraction, by estimation, is 45 to 50%. The left  ventricle has mildly decreased function. The left ventricle has no  regional wall motion abnormalities.  There is severe left ventricular hypertrophy. Left ventricular diastolic  parameters are indeterminate.  2. Right ventricular systolic function is normal. The right ventricular  size is  normal.  3. Left atrial size was moderately dilated.  4. The mitral valve is normal in structure. Trivial mitral valve  regurgitation. No evidence of mitral stenosis.  5. The aortic valve is normal in structure. Aortic valve regurgitation is  not visualized. No aortic stenosis is present.  6. Aortic dilatation noted. There is mild dilatation of the ascending  aorta, measuring 38 mm.  7. The inferior vena cava is normal in size with greater than 50%  respiratory variability, suggesting right atrial pressure of 3 mmHg.   Epic records are reviewed at length today  CHA2DS2-VASc Score = 3  The patient's score is based upon: CHF History: No HTN History: Yes Diabetes History: Yes Stroke History: No Vascular Disease History: No Age Score: 1 Gender Score: 0      ASSESSMENT AND PLAN: 1. Persistent Atrial Fibrillation The patient's CHA2DS2-VASc score is 3, indicating a 3.2% annual risk of stroke.   Cardioversion cancelled 2/2 patient being off Eliquis. We discussed anticoagulation options today. He states that he can now get Eliquis for $10 per month and this is affordable for him. Could consider warfarin in the future if needed.  He will need to be on  anticoagulation at least 3 weeks with no interruption before DCCV. He restarted Eliquis on 11/27/20.  He is not sure he wants to pursue DCCV at this time given he is asymptomatic however given his mild decrease in EF, may need trial of SR.  Continue Eliquis 5 mg BID  2. Secondary Hypercoagulable State (ICD10:  D68.69) The patient is at significant risk for stroke/thromboembolism based upon his CHA2DS2-VASc Score of 3.  Continue Apixaban (Eliquis).   3. Obesity Body mass index is 46.38 kg/m. Lifestyle modification was discussed and encouraged including regular physical activity and weight reduction. [facility].  4. Snoring/witnessed apnea/daytimesomnolence  Sleep study scheduled for 5/20.   5. HTN Stable, no changes today.  6. Systolic dysfunction EF 16-10%, inferior basal hypokinesis No signs or symptoms of fluid overload. ? If related to #1. He certainly has risk factors for coronary disease.    Follow up with Dr Sallyanne Kuster as scheduled.    [facility] [address] [place], [place] [address] [phone] 12/06/2020 1:53 PM

## 2020-12-12 NOTE — Progress Notes (Signed)
Chief Complaint:   OBESITY Darius Ingram is here to discuss his progress with his obesity treatment plan along with follow-up of his obesity related diagnoses.   Today's visit was #: 4 Starting weight: 339 lbs Starting date: 10/11/2020 Today's weight: 336 lbs Today's date: 12/05/2020 Weight change since last visit: 3 lbs Total lbs lost to date: 3 lbs Body mass index is 45.57 kg/m.  Total weight loss percentage to date: -0.88%  Interim History:  Darius Ingram says he is taking his medications.  He has been eating strawberries, tuna fish, chicken.  He has not been drinking any soda.  He has had some sour dough or whole wheat bread.  He is eating less cereal in the morning, he says.  He got his COVID booster shot and is still on 4.03 mg Trulicity weekly.  He is having a follow-up for his A-fib tomorrow.  He will see his PCP on 01/17/2021, and is scheduled for a sleep study next Friday.  He will be getting a new primary cardiologist soon.  Current Meal Plan: practicing portion control and making smarter food choices, such as increasing vegetables and decreasing simple carbohydrates for 90% of the time.  Current Exercise Plan: Increase walking. Current Anti-Obesity Medications: Trulicity 4.74 mg subcutaneously weekly. Side effects: None.  Assessment/Plan:   1. Type 2 diabetes mellitus with other specified complication, without long-term current use of insulin (HCC) Diabetes Mellitus: Not at goal. Medication: metformin 1,000 mg twice daily, Trulicity 2.59 mg subcutaneously weekly. Issues reviewed: blood sugar goals, complications of diabetes mellitus, hypoglycemia prevention and treatment, exercise, and nutrition.   Plan:  Continue medications as currently prescribed.  The patient will continue to focus on protein-rich, low simple carbohydrate foods. We reviewed the importance of hydration, regular exercise for stress reduction, and restorative sleep.   Lab Results  Component Value Date   HGBA1C 7.0  (A) 10/02/2020   HGBA1C 6.4 (A) 07/04/2020   HGBA1C 6.9 (A) 04/03/2020   Lab Results  Component Value Date   LDLCALC 93 10/02/2020   CREATININE 1.03 11/14/2020   2. Essential hypertension At goal. Medications: Norvasc 10 mg daily, lisinopril 20 mg daily.   Plan: Avoid buying foods that are: processed, frozen, or prepackaged to avoid excess salt. We will watch for signs of hypotension as he continues lifestyle modifications.  BP Readings from Last 3 Encounters:  12/06/20 112/70  12/05/20 (!) 107/56  11/14/20 117/66   Lab Results  Component Value Date   CREATININE 1.03 11/14/2020   3. Primary osteoarthritis of right knee Increased pain in the afternoon.  He is taking the bus more often. Plan:  Will follow along as it relates to his weight loss journey.  4. Persistent atrial fibrillation ([place]) Darius Ingram takes Eliquis 5 mg twice daily.  He has a follow-up scheduled regarding his A-fib.  Plan:  Continue to follow with Cardiology.  5. Obesity, current BMI 45.6  Course: Darius Ingram is currently in the action stage of change. As such, his goal is to continue with weight loss efforts.   Nutrition goals: He has agreed to practicing portion control and making smarter food choices, such as increasing vegetables and decreasing simple carbohydrates.   Exercise goals: As is.  Behavioral modification strategies: increasing lean protein intake, decreasing simple carbohydrates, increasing vegetables, increasing water intake and decreasing liquid calories.  Darius Ingram has agreed to follow-up with our clinic in 2-3 weeks. He was informed of the importance of frequent follow-up visits to maximize his success with intensive lifestyle modifications  for his multiple health conditions.   Objective:   Blood pressure (!) 107/56, pulse (!) 50, temperature 98 F (36.7 C), temperature source Oral, height 6' (1.829 m), weight (!) 336 lb (152.4 kg), SpO2 97 %. Body mass index is 45.57 kg/m.  General:  Cooperative, alert, well developed, in no acute distress. HEENT: Conjunctivae and lids unremarkable. Cardiovascular: Regular rhythm.  Lungs: Normal work of breathing. Neurologic: No focal deficits.   Lab Results  Component Value Date   CREATININE 1.03 11/14/2020   BUN 21 11/14/2020   NA 139 11/14/2020   K 3.5 11/14/2020   CL 106 11/14/2020   CO2 25 11/14/2020   Lab Results  Component Value Date   ALT 16 04/03/2020   AST 16 04/03/2020   ALKPHOS 85 04/03/2020   BILITOT 0.5 04/03/2020   Lab Results  Component Value Date   HGBA1C 7.0 (A) 10/02/2020   HGBA1C 6.4 (A) 07/04/2020   HGBA1C 6.9 (A) 04/03/2020   HGBA1C 9.9 (A) 12/29/2019   HGBA1C 7.4 (A) 04/20/2018   Lab Results  Component Value Date   CHOL 152 10/02/2020   HDL 37 (L) 10/02/2020   LDLCALC 93 10/02/2020   TRIG 123 10/02/2020   CHOLHDL 4.1 10/02/2020   Lab Results  Component Value Date   WBC 11.2 (H) 11/14/2020   HGB 15.5 11/14/2020   HCT 47.1 11/14/2020   MCV 89.0 11/14/2020   PLT 281 11/14/2020   Obesity Behavioral Intervention:   Approximately 15 minutes were spent on the discussion below.  ASK: We discussed the diagnosis of obesity with Darius Ingram today and Darius Ingram agreed to give [place] permission to discuss obesity behavioral modification therapy today.  ASSESS: Darius Ingram has the diagnosis of obesity and his BMI today is 45.6. Darius Ingram is in the action stage of change.   ADVISE: Darius Ingram was educated on the multiple health risks of obesity as well as the benefit of weight loss to improve his health. He was advised of the need for long term treatment and the importance of lifestyle modifications to improve his current health and to decrease his risk of future health problems.  AGREE: Multiple dietary modification options and treatment options were discussed and Darius Ingram agreed to follow the recommendations documented in the above note.  ARRANGE: Darius Ingram was educated on the importance of frequent visits to treat  obesity as outlined per CMS and USPSTF guidelines and agreed to schedule his next follow up appointment today.  Attestation Statements:   Reviewed by clinician on day of visit: allergies, medications, problem list, medical history, surgical history, family history, social history, and previous encounter notes.  I, Water quality scientist, CMA, am acting as transcriptionist for Briscoe Deutscher, DO  I have reviewed the above documentation for accuracy and completeness, and I agree with the above. Briscoe Deutscher, DO

## 2020-12-14 ENCOUNTER — Encounter (HOSPITAL_BASED_OUTPATIENT_CLINIC_OR_DEPARTMENT_OTHER): Payer: Medicare Other | Admitting: Cardiovascular Disease

## 2020-12-16 ENCOUNTER — Other Ambulatory Visit: Payer: Self-pay | Admitting: Emergency Medicine

## 2020-12-16 DIAGNOSIS — I10 Essential (primary) hypertension: Secondary | ICD-10-CM

## 2020-12-16 DIAGNOSIS — I152 Hypertension secondary to endocrine disorders: Secondary | ICD-10-CM

## 2020-12-20 ENCOUNTER — Other Ambulatory Visit: Payer: Self-pay

## 2020-12-20 ENCOUNTER — Ambulatory Visit (HOSPITAL_BASED_OUTPATIENT_CLINIC_OR_DEPARTMENT_OTHER): Payer: Medicare Other | Attending: Physician Assistant | Admitting: Cardiovascular Disease

## 2020-12-20 DIAGNOSIS — G4733 Obstructive sleep apnea (adult) (pediatric): Secondary | ICD-10-CM | POA: Diagnosis not present

## 2020-12-20 DIAGNOSIS — Z7901 Long term (current) use of anticoagulants: Secondary | ICD-10-CM | POA: Insufficient documentation

## 2020-12-20 DIAGNOSIS — G4731 Primary central sleep apnea: Secondary | ICD-10-CM | POA: Insufficient documentation

## 2020-12-20 DIAGNOSIS — R0902 Hypoxemia: Secondary | ICD-10-CM | POA: Diagnosis not present

## 2020-12-20 DIAGNOSIS — I493 Ventricular premature depolarization: Secondary | ICD-10-CM | POA: Insufficient documentation

## 2020-12-20 DIAGNOSIS — R0681 Apnea, not elsewhere classified: Secondary | ICD-10-CM

## 2020-12-20 DIAGNOSIS — I4891 Unspecified atrial fibrillation: Secondary | ICD-10-CM | POA: Insufficient documentation

## 2020-12-20 DIAGNOSIS — Z79899 Other long term (current) drug therapy: Secondary | ICD-10-CM | POA: Diagnosis not present

## 2020-12-21 ENCOUNTER — Ambulatory Visit (INDEPENDENT_AMBULATORY_CARE_PROVIDER_SITE_OTHER): Payer: Medicare Other | Admitting: Cardiovascular Disease

## 2020-12-21 ENCOUNTER — Encounter: Payer: Self-pay | Admitting: Cardiovascular Disease

## 2020-12-21 VITALS — BP 124/68 | HR 80 | Ht 72.0 in | Wt 336.6 lb

## 2020-12-21 DIAGNOSIS — E1169 Type 2 diabetes mellitus with other specified complication: Secondary | ICD-10-CM

## 2020-12-21 DIAGNOSIS — I452 Bifascicular block: Secondary | ICD-10-CM

## 2020-12-21 DIAGNOSIS — E785 Hyperlipidemia, unspecified: Secondary | ICD-10-CM

## 2020-12-21 DIAGNOSIS — I10 Essential (primary) hypertension: Secondary | ICD-10-CM | POA: Diagnosis not present

## 2020-12-21 DIAGNOSIS — E669 Obesity, unspecified: Secondary | ICD-10-CM

## 2020-12-21 DIAGNOSIS — I4811 Longstanding persistent atrial fibrillation: Secondary | ICD-10-CM

## 2020-12-21 DIAGNOSIS — I4819 Other persistent atrial fibrillation: Secondary | ICD-10-CM | POA: Diagnosis not present

## 2020-12-21 NOTE — Progress Notes (Signed)
Cardiology Office Note:    Date:  12/25/2020   ID:  Darius Ingram, DOB Apr 19, 1956, MRN [id number]  PCP:  Horald Pollen, MD   [facility] Providers Cardiologist:  None     Referring MD: Oliver Barre, PA   Chief Complaint  Patient presents with  . Atrial Fibrillation    History of Present Illness:    Darius Ingram is a 65 y.o. male with a hx of hypertension type 2 diabetes mellitus, hypercholesterolemia, mild left ventricular systolic dysfunction (ECHO 11/14/2020 EF 45-50%), recently diagnosed with persistent atrial fibrillation since March 2022, during [facility].  He has been on anticoagulation with Eliquis only since  11/27/2020.  He had a sleep study performed on 12/14/2020, report not yet available.  He feels well and walks about a mile a day without complaints of shortness of breath, palpitations, dizziness, syncope or chest discomfort.  He denies orthopnea, PND or lower extremity edema.  He is a snorer and has daytime hypersomnolence.  His BMI is over 45.  He has not had any falls, injuries or bleeding problems.  He denies claudication or focal neurological events.  Blood pressures currently well controlled on a combination of ACE inhibitor and dihydropyridine calcium channel blocker.  He is not on any rate control medications and has a spontaneously controlled ventricular rate at 80 bpm.  Past Medical History:  Diagnosis Date  . Allergy    seasonal allergies  . Diabetes mellitus without complication ([place])   . Hyperlipidemia   . Hypertension   . Hypothyroidism   . Joint pain   . SOBOE (shortness of breath on exertion)     Past Surgical History:  Procedure Laterality Date  . TONSILECTOMY, ADENOIDECTOMY, BILATERAL MYRINGOTOMY AND TUBES  1960  . TYMPANOSTOMY  1960  . WISDOM TOOTH EXTRACTION  1978    Current Medications: Current Meds  Medication Sig  . acetaminophen (TYLENOL) 325 MG tablet Take 650  mg by mouth every 6 (six) hours as needed for moderate pain.  Marland Kitchen amLODipine (NORVASC) 10 MG tablet Take 1 tablet (10 mg total) by mouth daily.  Marland Kitchen apixaban (ELIQUIS) 5 MG TABS tablet Take 1 tablet (5 mg total) by mouth 2 (two) times daily.  Marland Kitchen atorvastatin (LIPITOR) 40 MG tablet Take 1 tablet by mouth once daily  . calcium carbonate (TUMS EX) 750 MG chewable tablet Chew 750-1,500 mg by mouth daily as needed for heartburn.  . cetirizine (ZYRTEC) 10 MG tablet Take 1 tablet (10 mg total) by mouth daily.  . Dulaglutide (TRULICITY) 1.32 GM/0.1UU SOPN Inject 0.75 mg into the skin every Saturday.  . fluticasone (FLONASE) 50 MCG/ACT nasal spray Place 2 sprays into both nostrils daily.  Marland Kitchen lisinopril (ZESTRIL) 20 MG tablet Take 1 tablet by mouth once daily  . metFORMIN (GLUCOPHAGE) 1000 MG tablet Take 1 tablet (1,000 mg total) by mouth 2 (two) times daily with a meal.  . Multiple Vitamin (MULTIVITAMIN PO) Take 1 tablet by mouth daily.     Allergies:   Penicillins   Social History   Socioeconomic History  . Marital status: Single    Spouse name: Not on file  . Number of children: Not on file  . Years of education: Not on file  . Highest education level: Not on file  Occupational History  . Occupation: Retired Biomedical scientist  Tobacco Use  . Smoking status: Former Smoker    Packs/day: 1.00    Years: 10.00    Pack years:  10.00    Types: Cigarettes    Quit date: 12/29/1998    Years since quitting: 22.0  . Smokeless tobacco: Never Used  Vaping Use  . Vaping Use: Never used  Substance and Sexual Activity  . Alcohol use: Yes    Alcohol/week: 1.0 standard drink    Types: 1 Cans of beer per week    Comment: occ  . Drug use: No  . Sexual activity: Never  Other Topics Concern  . Not on file  Social History Narrative  . Not on file   Social Determinants of Health   Financial Resource Strain: Not on file  Food Insecurity: Not on file  Transportation Needs: Not on file  Physical Activity: Not on file   Stress: Not on file  Social Connections: Not on file     Family History: The patient's family history includes Diabetes in his father and mother; Heart disease in his father; Hypertension in his father; Obesity in his father and mother. There is no history of Colon polyps, Colon cancer, Esophageal cancer, Stomach cancer, or Rectal cancer.  ROS:   Please see the history of present illness.     All other systems reviewed and are negative.  EKGs/Labs/Other Studies Reviewed:    The following studies were reviewed today: Echocardiogram 11/14/2020  1. Abnormal septal motion inferior basal hypokinesis Abnormal GLS -13.3 .  Left ventricular ejection fraction, by estimation, is 45 to 50%. The left  ventricle has mildly decreased function. The left ventricle has no  regional wall motion abnormalities.  There is severe left ventricular hypertrophy. Left ventricular diastolic  parameters are indeterminate.  2. Right ventricular systolic function is normal. The right ventricular  size is normal.  3. Left atrial size was moderately dilated.  4. The mitral valve is normal in structure. Trivial mitral valve  regurgitation. No evidence of mitral stenosis.  5. The aortic valve is normal in structure. Aortic valve regurgitation is  not visualized. No aortic stenosis is present.  6. Aortic dilatation noted. There is mild dilatation of the ascending  aorta, measuring 38 mm.  7. The inferior vena cava is normal in size with greater than 50%  respiratory variability, suggesting right atrial pressure of 3 mmHg.   EKG:  EKG is not ordered today.  The ekg ordered today demonstrates atrial fibrillation with controlled ventricular rate, right bundle branch block, left anterior fascicular block  Recent Labs: 04/03/2020: ALT 16 11/14/2020: BUN 21; Creatinine, Ser 1.03; Hemoglobin 15.5; Platelets 281; Potassium 3.5; Sodium 139  Recent Lipid Panel    Component Value Date/Time   CHOL 152 10/02/2020  1039   TRIG 123 10/02/2020 1039   HDL 37 (L) 10/02/2020 1039   CHOLHDL 4.1 10/02/2020 1039   CHOLHDL 4.1 05/16/2013 1444   VLDL 15 05/16/2013 1444   LDLCALC 93 10/02/2020 1039     Risk Assessment/Calculations:    CHA2DS2-VASc Score = 3  This indicates a 3.2% annual risk of stroke. The patient's score is based upon: CHF History: No HTN History: Yes Diabetes History: Yes Stroke History: No Vascular Disease History: No Age Score: 1 Gender Score: 0      Physical Exam:    VS:  BP 124/68 (BP Location: Left Arm, Patient Position: Sitting, Cuff Size: Large)   Pulse 80   Ht 6' (1.829 m)   Wt (!) 336 lb 9.6 oz (152.7 kg)   BMI 45.65 kg/m     Wt Readings from Last 3 Encounters:  12/21/20 (!) 336 lb 9.6  oz (152.7 kg)  12/21/20 (!) 336 lb (152.4 kg)  12/06/20 (!) 342 lb (155.1 kg)     GEN: Morbidly obese, well nourished, well developed in no acute distress HEENT: Normal NECK: No JVD; No carotid bruits LYMPHATICS: No lymphadenopathy CARDIAC: Irregular, normal S1, widely split S2, no murmurs, rubs, gallops RESPIRATORY:  Clear to auscultation without rales, wheezing or rhonchi  ABDOMEN: Soft, non-tender, non-distended MUSCULOSKELETAL:  No edema; No deformity  SKIN: Warm and dry NEUROLOGIC:  Alert and oriented x 3 PSYCHIATRIC:  Normal affect   ASSESSMENT:    1. Persistent atrial fibrillation ([place])   2. Longstanding persistent atrial fibrillation ([place])   3. Bifascicular block   4. Morbid obesity ([place])   5. Essential hypertension   6. Dyslipidemia (high LDL; low HDL)   7. Diabetes mellitus type 2 in obese [facility])    PLAN:    In order of problems listed above:  1. AFib: He has been on anticoagulation for the last 3 weeks.  No history of stroke/TIA.  Unclear whether he would have any functional improvement with return to normal rhythm.  We will schedule for cardioversion.  Discussed the purpose of the procedure and its potential benefits as well as risks and complications.   It will only be important to pursue maintenance of sinus rhythm if there is evidence of significant functional improvement after the cardioversion.  Impressed upon him the importance of uninterrupted anticoagulation before that procedure and for the month that follows it.  Reduce alcohol intake.  We will follow-up on the sleep study report. 2. Anticoagulation: So far without any bleeding problems 3. Bifascicular block: He has spontaneously well rate controlled atrial fibrillation as well as right bundle branch block and left anterior fascicular block, suggesting significant AV and intraventricular conduction abnormalities.  He may eventually require pacemaker therapy. 4. Morbid obesity: will make atrial fibrillation more likely to recur. 5. HTN: Well-controlled. 6. HLP: No known CAD or PAD.  Acceptable LDL cholesterol of 93, but his HDL is quite low as 37.  Recommend weight loss and steadily increasing physical activity.  He is [facility] at [facility]. 7. DM: Glycemic control with hemoglobin A1c of 7%.   Shared Decision Making/Informed Consent The risks (stroke, cardiac arrhythmias rarely resulting in the need for a temporary or permanent pacemaker, skin irritation or burns and complications associated with conscious sedation including aspiration, arrhythmia, respiratory failure and death), benefits (restoration of normal sinus rhythm) and alternatives of a direct current cardioversion were explained in detail to Darius Ingram and he agrees to proceed.          Medication Adjustments/Labs and Tests Ordered: Current medicines are reviewed at length with the patient today.  Concerns regarding medicines are outlined above.  Orders Placed This Encounter  Procedures  . CBC  . Basic metabolic panel   No orders of the defined types were placed in this encounter.   Patient Instructions  Medication Instructions:  No changes *If you need a refill on your cardiac medications  before your next appointment, please call your pharmacy*  Follow-Up: At [facility], you and your health needs are our priority.  As part of our continuing mission to provide you with exceptional heart care, we have created designated Provider Care Teams.  These Care Teams include your primary Cardiologist (physician) and Advanced Practice Providers (APPs -  Physician Assistants and Nurse Practitioners) who all work together to provide you with the care you need, when you need it.  We recommend  signing up for the patient portal called "MyChart".  Sign up information is provided on this After Visit Summary.  MyChart is used to connect with patients for Virtual Visits (Telemedicine).  Patients are able to view lab/test results, encounter notes, upcoming appointments, etc.  Non-urgent messages can be sent to your provider as well.   To learn more about what you can do with MyChart, go to [email or web address].    Your next appointment:   Follow up with Adline Peals at the [facility] Follow up with Dr. Sallyanne Kuster in 4-5 months   Other Instructions  You are scheduled for a Cardioversion on (we will call you with the date).  Please arrive at the [facility] (Main Entrance A) at [facility]: [address] [place], [place] [address] at (we will call you with the time)  DIET: Nothing to eat or drink after midnight except a sip of water with medications (see medication instructions below)  Medication Instructions: Hold: nothing to hold  Continue your anticoagulant: Eliquis. Please call us if you miss a dose [phone] You will need to continue your anticoagulant after your procedure until you  are told by your provider that it is safe to stop   Labs:  Your provider would like for you to return in 3-5 days before your scheduled cardioversion to have the following labs drawn: BMET and CBC. You do not need an appointment for the lab. Once in our office lobby there is a podium where you  can sign in and ring the doorbell to alert [place] that you are here. The lab is open from 8:00 am to 4:30 pm; closed for lunch from 12:45pm-1:45pm.  You must have a responsible person to drive you home and stay in the waiting area during your procedure. Failure to do so could result in cancellation.  Bring your insurance cards.  *Special Note: Every effort is made to have your procedure done on time. Occasionally there are emergencies that occur at the hospital that may cause delays. Please be patient if a delay does occur.       Signed, Sanda Klein, MD  12/25/2020 5:10 PM     [facility]

## 2020-12-21 NOTE — H&P (View-Only) (Signed)
Cardiology Office Note:    Date:  12/25/2020   ID:  Brewer Hitchman, DOB 1955/11/02, MRN [id number]  PCP:  Horald Pollen, MD   [facility] Providers Cardiologist:  None     Referring MD: Oliver Barre, PA   Chief Complaint  Patient presents with  . Atrial Fibrillation    History of Present Illness:    Darius Ingram is a 65 y.o. male with a hx of hypertension type 2 diabetes mellitus, hypercholesterolemia, mild left ventricular systolic dysfunction (ECHO 11/14/2020 EF 45-50%), recently diagnosed with persistent atrial fibrillation since March 2022, during a [facility] evaluation [facility].  He has been on anticoagulation with Eliquis only since  11/27/2020.  He had a sleep study performed on 12/14/2020, report not yet available.  He feels well and walks about a mile a day without complaints of shortness of breath, palpitations, dizziness, syncope or chest discomfort.  He denies orthopnea, PND or lower extremity edema.  He is a snorer and has daytime hypersomnolence.  His BMI is over 45.  He has not had any falls, injuries or bleeding problems.  He denies claudication or focal neurological events.  Blood pressures currently well controlled on a combination of ACE inhibitor and dihydropyridine calcium channel blocker.  He is not on any rate control medications and has a spontaneously controlled ventricular rate at 80 bpm.  Past Medical History:  Diagnosis Date  . Allergy    seasonal allergies  . Diabetes mellitus without complication ([place])   . Hyperlipidemia   . Hypertension   . Hypothyroidism   . Joint pain   . SOBOE (shortness of breath on exertion)     Past Surgical History:  Procedure Laterality Date  . TONSILECTOMY, ADENOIDECTOMY, BILATERAL MYRINGOTOMY AND TUBES  1960  . TYMPANOSTOMY  1960  . WISDOM TOOTH EXTRACTION  1978    Current Medications: Current Meds  Medication Sig  . acetaminophen (TYLENOL) 325 MG tablet Take 650  mg by mouth every 6 (six) hours as needed for moderate pain.  Marland Kitchen amLODipine (NORVASC) 10 MG tablet Take 1 tablet (10 mg total) by mouth daily.  Marland Kitchen apixaban (ELIQUIS) 5 MG TABS tablet Take 1 tablet (5 mg total) by mouth 2 (two) times daily.  Marland Kitchen atorvastatin (LIPITOR) 40 MG tablet Take 1 tablet by mouth once daily  . calcium carbonate (TUMS EX) 750 MG chewable tablet Chew 750-1,500 mg by mouth daily as needed for heartburn.  . cetirizine (ZYRTEC) 10 MG tablet Take 1 tablet (10 mg total) by mouth daily.  . Dulaglutide (TRULICITY) 2.20 UR/4.2HC SOPN Inject 0.75 mg into the skin every Saturday.  . fluticasone (FLONASE) 50 MCG/ACT nasal spray Place 2 sprays into both nostrils daily.  Marland Kitchen lisinopril (ZESTRIL) 20 MG tablet Take 1 tablet by mouth once daily  . metFORMIN (GLUCOPHAGE) 1000 MG tablet Take 1 tablet (1,000 mg total) by mouth 2 (two) times daily with a meal.  . Multiple Vitamin (MULTIVITAMIN PO) Take 1 tablet by mouth daily.     Allergies:   Penicillins   Social History   Socioeconomic History  . Marital status: Single    Spouse name: Not on file  . Number of children: Not on file  . Years of education: Not on file  . Highest education level: Not on file  Occupational History  . Occupation: Retired Biomedical scientist  Tobacco Use  . Smoking status: Former Smoker    Packs/day: 1.00    Years: 10.00    Pack years:  10.00    Types: Cigarettes    Quit date: 12/29/1998    Years since quitting: 22.0  . Smokeless tobacco: Never Used  Vaping Use  . Vaping Use: Never used  Substance and Sexual Activity  . Alcohol use: Yes    Alcohol/week: 1.0 standard drink    Types: 1 Cans of beer per week    Comment: occ  . Drug use: No  . Sexual activity: Never  Other Topics Concern  . Not on file  Social History Narrative  . Not on file   Social Determinants of Health   Financial Resource Strain: Not on file  Food Insecurity: Not on file  Transportation Needs: Not on file  Physical Activity: Not on file   Stress: Not on file  Social Connections: Not on file     Family History: The patient's family history includes Diabetes in his father and mother; Heart disease in his father; Hypertension in his father; Obesity in his father and mother. There is no history of Colon polyps, Colon cancer, Esophageal cancer, Stomach cancer, or Rectal cancer.  ROS:   Please see the history of present illness.     All other systems reviewed and are negative.  EKGs/Labs/Other Studies Reviewed:    The following studies were reviewed today: Echocardiogram 11/14/2020  1. Abnormal septal motion inferior basal hypokinesis Abnormal GLS -13.3 .  Left ventricular ejection fraction, by estimation, is 45 to 50%. The left  ventricle has mildly decreased function. The left ventricle has no  regional wall motion abnormalities.  There is severe left ventricular hypertrophy. Left ventricular diastolic  parameters are indeterminate.  2. Right ventricular systolic function is normal. The right ventricular  size is normal.  3. Left atrial size was moderately dilated.  4. The mitral valve is normal in structure. Trivial mitral valve  regurgitation. No evidence of mitral stenosis.  5. The aortic valve is normal in structure. Aortic valve regurgitation is  not visualized. No aortic stenosis is present.  6. Aortic dilatation noted. There is mild dilatation of the ascending  aorta, measuring 38 mm.  7. The inferior vena cava is normal in size with greater than 50%  respiratory variability, suggesting right atrial pressure of 3 mmHg.   EKG:  EKG is not ordered today.  The ekg ordered today demonstrates atrial fibrillation with controlled ventricular rate, right bundle branch block, left anterior fascicular block  Recent Labs: 04/03/2020: ALT 16 11/14/2020: BUN 21; Creatinine, Ser 1.03; Hemoglobin 15.5; Platelets 281; Potassium 3.5; Sodium 139  Recent Lipid Panel    Component Value Date/Time   CHOL 152 10/02/2020  1039   TRIG 123 10/02/2020 1039   HDL 37 (L) 10/02/2020 1039   CHOLHDL 4.1 10/02/2020 1039   CHOLHDL 4.1 05/16/2013 1444   VLDL 15 05/16/2013 1444   LDLCALC 93 10/02/2020 1039     Risk Assessment/Calculations:    CHA2DS2-VASc Score = 3  This indicates a 3.2% annual risk of stroke. The patient's score is based upon: CHF History: No HTN History: Yes Diabetes History: Yes Stroke History: No Vascular Disease History: No Age Score: 1 Gender Score: 0      Physical Exam:    VS:  BP 124/68 (BP Location: Left Arm, Patient Position: Sitting, Cuff Size: Large)   Pulse 80   Ht 6' (1.829 m)   Wt (!) 336 lb 9.6 oz (152.7 kg)   BMI 45.65 kg/m     Wt Readings from Last 3 Encounters:  12/21/20 (!) 336 lb 9.6  oz (152.7 kg)  12/21/20 (!) 336 lb (152.4 kg)  12/06/20 (!) 342 lb (155.1 kg)     GEN: Morbidly obese, well nourished, well developed in no acute distress HEENT: Normal NECK: No JVD; No carotid bruits LYMPHATICS: No lymphadenopathy CARDIAC: Irregular, normal S1, widely split S2, no murmurs, rubs, gallops RESPIRATORY:  Clear to auscultation without rales, wheezing or rhonchi  ABDOMEN: Soft, non-tender, non-distended MUSCULOSKELETAL:  No edema; No deformity  SKIN: Warm and dry NEUROLOGIC:  Alert and oriented x 3 PSYCHIATRIC:  Normal affect   ASSESSMENT:    1. Persistent atrial fibrillation ([place])   2. Longstanding persistent atrial fibrillation ([place])   3. Bifascicular block   4. Morbid obesity ([place])   5. Essential hypertension   6. Dyslipidemia (high LDL; low HDL)   7. Diabetes mellitus type 2 in obese [facility])    PLAN:    In order of problems listed above:  1. AFib: He has been on anticoagulation for the last 3 weeks.  No history of stroke/TIA.  Unclear whether he would have any functional improvement with return to normal rhythm.  We will schedule for cardioversion.  Discussed the purpose of the procedure and its potential benefits as well as risks and complications.   It will only be important to pursue maintenance of sinus rhythm if there is evidence of significant functional improvement after the cardioversion.  Impressed upon him the importance of uninterrupted anticoagulation before that procedure and for the month that follows it.  Reduce alcohol intake.  We will follow-up on the sleep study report. 2. Anticoagulation: So far without any bleeding problems 3. Bifascicular block: He has spontaneously well rate controlled atrial fibrillation as well as right bundle branch block and left anterior fascicular block, suggesting significant AV and intraventricular conduction abnormalities.  He may eventually require pacemaker therapy. 4. Morbid obesity: will make atrial fibrillation more likely to recur. 5. HTN: Well-controlled. 6. HLP: No known CAD or PAD.  Acceptable LDL cholesterol of 93, but his HDL is quite low as 37.  Recommend weight loss and steadily increasing physical activity.  He is enrolled at healthy weight [facility] wellness. 7. DM: Glycemic control with hemoglobin A1c of 7%.   Shared Decision Making/Informed Consent The risks (stroke, cardiac arrhythmias rarely resulting in the need for a temporary or permanent pacemaker, skin irritation or burns and complications associated with conscious sedation including aspiration, arrhythmia, respiratory failure and death), benefits (restoration of normal sinus rhythm) and alternatives of a direct current cardioversion were explained in detail to Mr. Fortson and he agrees to proceed.          Medication Adjustments/Labs and Tests Ordered: Current medicines are reviewed at length with the patient today.  Concerns regarding medicines are outlined above.  Orders Placed This Encounter  Procedures  . CBC  . Basic metabolic panel   No orders of the defined types were placed in this encounter.   Patient Instructions  Medication Instructions:  No changes *If you need a refill on your cardiac medications  before your next appointment, please call your pharmacy*  Follow-Up: At [facility], you and your health needs are our priority.  As part of our continuing mission to provide you with exceptional heart care, we have created designated Provider Care Teams.  These Care Teams include your primary Cardiologist (physician) and Advanced Practice Providers (APPs -  Physician Assistants and Nurse Practitioners) who all work together to provide you with the care you need, when you need it.  We recommend  signing up for the patient portal called "MyChart".  Sign up information is provided on this After Visit Summary.  MyChart is used to connect with patients for Virtual Visits (Telemedicine).  Patients are able to view lab/test results, encounter notes, upcoming appointments, etc.  Non-urgent messages can be sent to your provider as well.   To learn more about what you can do with MyChart, go to [email or web address].    Your next appointment:   Follow up with Adline Peals at the [place] [facility] Follow up with Dr. Sallyanne Kuster in 4-5 months   Other Instructions  You are scheduled for a Cardioversion on (we will call you with the date).  Please arrive at the [facility] (Main Entrance A) at [facility]: [address] [place], [place] [address] at (we will call you with the time)  DIET: Nothing to eat or drink after midnight except a sip of water with medications (see medication instructions below)  Medication Instructions: Hold: nothing to hold  Continue your anticoagulant: Eliquis. Please call us if you miss a dose [phone] You will need to continue your anticoagulant after your procedure until you  are told by your provider that it is safe to stop   Labs:  Your provider would like for you to return in 3-5 days before your scheduled cardioversion to have the following labs drawn: BMET and CBC. You do not need an appointment for the lab. Once in our office lobby there is a podium where you  can sign in and ring the doorbell to alert [place] that you are here. The lab is open from 8:00 am to 4:30 pm; closed for lunch from 12:45pm-1:45pm.  You must have a responsible person to drive you home and stay in the waiting area during your procedure. Failure to do so could result in cancellation.  Bring your insurance cards.  *Special Note: Every effort is made to have your procedure done on time. Occasionally there are emergencies that occur at the hospital that may cause delays. Please be patient if a delay does occur.       Signed, Sanda Klein, MD  12/25/2020 5:10 PM    [facility]

## 2020-12-21 NOTE — Patient Instructions (Addendum)
Medication Instructions:  No changes *If you need a refill on your cardiac medications before your next appointment, please call your pharmacy*  Follow-Up: At [facility], you and your health needs are our priority.  As part of our continuing mission to provide you with exceptional heart care, we have created designated Provider Care Teams.  These Care Teams include your primary Cardiologist (physician) and Advanced Practice Providers (APPs -  Physician Assistants and Nurse Practitioners) who all work together to provide you with the care you need, when you need it.  We recommend signing up for the patient portal called "MyChart".  Sign up information is provided on this After Visit Summary.  MyChart is used to connect with patients for Virtual Visits (Telemedicine).  Patients are able to view lab/test results, encounter notes, upcoming appointments, etc.  Non-urgent messages can be sent to your provider as well.   To learn more about what you can do with MyChart, go to [email or web address].    Your next appointment:   Follow up with Adline Peals at the [place] clinic Follow up with Dr. Sallyanne Kuster in 4-5 months   Other Instructions  You are scheduled for a Cardioversion on (we will call you with the date).  Please arrive at the [facility] (Main Entrance A) at [facility]: [address] [place], [place] [address] at (we will call you with the time)  DIET: Nothing to eat or drink after midnight except a sip of water with medications (see medication instructions below)  Medication Instructions: Hold: nothing to hold  Continue your anticoagulant: Eliquis. Please call us if you miss a dose [phone] You will need to continue your anticoagulant after your procedure until you  are told by your provider that it is safe to stop   Labs:  Your provider would like for you to return in 3-5 days before your scheduled cardioversion to have the following labs drawn: BMET and CBC. You do  not need an appointment for the lab. Once in our office lobby there is a podium where you can sign in and ring the doorbell to alert [place] that you are here. The lab is open from 8:00 am to 4:30 pm; closed for lunch from 12:45pm-1:45pm.  You must have a responsible person to drive you home and stay in the waiting area during your procedure. Failure to do so could result in cancellation.  Bring your insurance cards.  *Special Note: Every effort is made to have your procedure done on time. Occasionally there are emergencies that occur at the hospital that may cause delays. Please be patient if a delay does occur.

## 2020-12-25 ENCOUNTER — Encounter: Payer: Self-pay | Admitting: Cardiovascular Disease

## 2020-12-25 DIAGNOSIS — I452 Bifascicular block: Secondary | ICD-10-CM | POA: Insufficient documentation

## 2020-12-25 DIAGNOSIS — E669 Obesity, unspecified: Secondary | ICD-10-CM | POA: Insufficient documentation

## 2020-12-25 DIAGNOSIS — E1159 Type 2 diabetes mellitus with other circulatory complications: Secondary | ICD-10-CM | POA: Insufficient documentation

## 2020-12-25 DIAGNOSIS — E785 Hyperlipidemia, unspecified: Secondary | ICD-10-CM | POA: Insufficient documentation

## 2020-12-25 DIAGNOSIS — E1169 Type 2 diabetes mellitus with other specified complication: Secondary | ICD-10-CM | POA: Insufficient documentation

## 2020-12-26 ENCOUNTER — Other Ambulatory Visit: Payer: Self-pay

## 2020-12-26 ENCOUNTER — Ambulatory Visit (INDEPENDENT_AMBULATORY_CARE_PROVIDER_SITE_OTHER): Payer: Medicare Other | Admitting: Family Medicine

## 2020-12-26 ENCOUNTER — Encounter (INDEPENDENT_AMBULATORY_CARE_PROVIDER_SITE_OTHER): Payer: Self-pay | Admitting: Family Medicine

## 2020-12-26 VITALS — BP 109/70 | HR 50 | Temp 97.7°F | Ht 72.0 in | Wt 326.0 lb

## 2020-12-26 DIAGNOSIS — Z5941 Food insecurity: Secondary | ICD-10-CM | POA: Diagnosis not present

## 2020-12-26 DIAGNOSIS — I4891 Unspecified atrial fibrillation: Secondary | ICD-10-CM | POA: Diagnosis not present

## 2020-12-26 DIAGNOSIS — I4819 Other persistent atrial fibrillation: Secondary | ICD-10-CM

## 2020-12-26 DIAGNOSIS — E66813 Obesity, class 3: Secondary | ICD-10-CM

## 2020-12-26 DIAGNOSIS — R0683 Snoring: Secondary | ICD-10-CM

## 2020-12-26 DIAGNOSIS — Z6841 Body Mass Index (BMI) 40.0 and over, adult: Secondary | ICD-10-CM

## 2020-12-31 ENCOUNTER — Encounter (INDEPENDENT_AMBULATORY_CARE_PROVIDER_SITE_OTHER): Payer: Self-pay | Admitting: Family Medicine

## 2020-12-31 DIAGNOSIS — Z5941 Food insecurity: Secondary | ICD-10-CM | POA: Insufficient documentation

## 2020-12-31 DIAGNOSIS — I4891 Unspecified atrial fibrillation: Secondary | ICD-10-CM | POA: Insufficient documentation

## 2020-12-31 NOTE — Progress Notes (Signed)
Chief Complaint:   OBESITY Darius Ingram is here to discuss his progress with his obesity treatment plan along with follow-up of his obesity related diagnoses. Donovan is on practicing portion control and making smarter food choices, such as increasing vegetables and decreasing simple carbohydrates and states he is following his eating plan approximately 60% of the time. Casimiro states he is walking 20 minutes 4-5 times per week.  Today's visit was #: 5 Starting weight: 339 lbs Starting date: 10/11/2020 Today's weight: 326 lbs Today's date: 12/26/2020 Total lbs lost to date: 13 lbs Total lbs lost since last in-office visit: 10  Interim History: Darius Ingram says he has reduced intake of junk foods such as chocolate. He is eating more protein and vegetables and less carbohydrates. He eats at the [facility] a few days per week but gets eggs, cottage cheese and pork loin or [place] sausage.  He feels Trulicity (per PCP) helps with appetite. He has cut back his serving size of cereal to 0.5 cups.   Subjective:   1. Snoring Darius Ingram had a sleep study last Thursday but does not have the results yet. He reports snoring.  2. Persistent atrial fibrillation (HCC) Darius Ingram is scheduled for a cardioversion. It was cancelled recently because he had been off of his Eliquis due to financial issues.   3. Food insecurity Darius Ingram sometimes runs short on money close to the end of the month for food and medication. He says he gets most medications for free. He gets food from [employer] bank.  Assessment/Plan:   1. Snoring Follow up with sleep med doctor, Dr. Claiborne Billings.  2. Persistent atrial fibrillation ([place]) Pt declined [facility] case management. Cardiologist, Dr. Sallyanne Kuster, is making sure pt has assistance for Eliquis.  3. Food insecurity [place] declined [facility] referral. Continue to monitor.  4. Obesity, current BMI 44.2 Darius Ingram is currently in the action stage of change. As such, his goal is to continue with weight  loss efforts. He has agreed to practicing portion control and making smarter food choices, such as increasing vegetables and decreasing simple carbohydrates.   Exercise goals: As is  Behavioral modification strategies: decreasing simple carbohydrates and planning for success.  Darius Ingram has agreed to follow-up with our clinic in 3 weeks with Dr. Juleen China and 6 weeks with [facility].   Objective:   Blood pressure 109/70, pulse (!) 50, temperature 97.7 F (36.5 C), height 6' (1.829 m), weight (!) 326 lb (147.9 kg), SpO2 98 %. Body mass index is 44.21 kg/m.  General: Cooperative, alert, well developed, in no acute distress. HEENT: Conjunctivae and lids unremarkable. Cardiovascular: Regular rhythm.  Lungs: Normal work of breathing. Neurologic: No focal deficits.   Lab Results  Component Value Date   CREATININE 1.03 11/14/2020   BUN 21 11/14/2020   NA 139 11/14/2020   K 3.5 11/14/2020   CL 106 11/14/2020   CO2 25 11/14/2020   Lab Results  Component Value Date   ALT 16 04/03/2020   AST 16 04/03/2020   ALKPHOS 85 04/03/2020   BILITOT 0.5 04/03/2020   Lab Results  Component Value Date   HGBA1C 7.0 (A) 10/02/2020   HGBA1C 6.4 (A) 07/04/2020   HGBA1C 6.9 (A) 04/03/2020   HGBA1C 9.9 (A) 12/29/2019   HGBA1C 7.4 (A) 04/20/2018   No results found for: INSULIN No results found for: TSH Lab Results  Component Value Date   CHOL 152 10/02/2020   HDL 37 (L) 10/02/2020   LDLCALC 93 10/02/2020   TRIG 123  10/02/2020   CHOLHDL 4.1 10/02/2020   Lab Results  Component Value Date   WBC 11.2 (H) 11/14/2020   HGB 15.5 11/14/2020   HCT 47.1 11/14/2020   MCV 89.0 11/14/2020   PLT 281 11/14/2020    Attestation Statements:   Reviewed by clinician on day of visit: allergies, medications, problem list, medical history, surgical history, family history, social history, and previous encounter notes.  Time spent on visit including pre-visit chart review and post-visit care and charting was 31  minutes.   Coral Ceo, CMA, am acting as Location manager for [employer], [place].  I have reviewed the above documentation for accuracy and completeness, and I agree with the above. -  Georgianne Fick, FNP

## 2021-01-02 ENCOUNTER — Telehealth: Payer: Self-pay | Admitting: *Deleted

## 2021-01-02 NOTE — Telephone Encounter (Signed)
Pt is returning a call to [place]. Please advise

## 2021-01-02 NOTE — Telephone Encounter (Signed)
Patient has been made aware and verbalized his understanding. He verbalized that he has not missed a dose with the Eliquis and the importance to call if he does.

## 2021-01-02 NOTE — Telephone Encounter (Signed)
Left a message for the patient to call back for Cardioversion instructions.    You are scheduled for a Cardioversion on 01/09/21 with Dr. Oval Linsey.  Please arrive at the [facility] (Main Entrance A) at [facility]: [address] [place], [place] [address] at [facility]. (1 hour prior to procedure unless lab work is needed)  DIET: Nothing to eat or drink after midnight except a sip of water with medications (see medication instructions below)  FYI: For your safety, and to allow [place] to monitor your vital signs accurately during the surgery/procedure we request that   if you have artificial nails, gel coating, SNS etc. Please have those removed prior to your surgery/procedure. Not having the nail coverings /polish removed may result in cancellation or delay of your surgery/procedure.   Medication Instructions: Hold diabetic medication.  Continue your anticoagulant: Eliquis. If you miss a dose, please call [phone] You will need to continue your anticoagulant after your procedure until you  are told by your provider that it is safe to stop   Labs:  Your provider would like for you to return on Friday 01/04/21 to have the following labs drawn: CBC and BMET. You do not need an appointment for the lab. Once in our office lobby there is a podium where you can sign in and ring the doorbell to alert [place] that you are here. The lab is open from 8:00 am to 4:30 pm; closed for lunch from 12:45pm-1:45pm.  You must have a responsible person to drive you home and stay in the waiting area during your procedure. Failure to do so could result in cancellation.  Bring your insurance cards.  *Special Note: Every effort is made to have your procedure done on time. Occasionally there are emergencies that occur at the hospital that may cause delays. Please be patient if a delay does occur.

## 2021-01-03 DIAGNOSIS — I4819 Other persistent atrial fibrillation: Secondary | ICD-10-CM | POA: Diagnosis not present

## 2021-01-04 LAB — CBC
Hematocrit: 43.3 % (ref 37.5–51.0)
Hemoglobin: 14.5 g/dL (ref 13.0–17.7)
MCH: 29.7 pg (ref 26.6–33.0)
MCHC: 33.5 g/dL (ref 31.5–35.7)
MCV: 89 fL (ref 79–97)
Platelets: 277 10*3/uL (ref 150–450)
RBC: 4.89 x10E6/uL (ref 4.14–5.80)
RDW: 14.4 % (ref 11.6–15.4)
WBC: 10.3 10*3/uL (ref 3.4–10.8)

## 2021-01-04 LAB — BASIC METABOLIC PANEL
BUN/Creatinine Ratio: 20 (ref 10–24)
BUN: 22 mg/dL (ref 8–27)
CO2: 24 mmol/L (ref 20–29)
Calcium: 8.8 mg/dL (ref 8.6–10.2)
Chloride: 102 mmol/L (ref 96–106)
Creatinine, Ser: 1.1 mg/dL (ref 0.76–1.27)
Glucose: 213 mg/dL — ABNORMAL HIGH (ref 65–99)
Potassium: 3.7 mmol/L (ref 3.5–5.2)
Sodium: 143 mmol/L (ref 134–144)
eGFR: 74 mL/min/{1.73_m2} (ref >59)

## 2021-01-09 ENCOUNTER — Encounter (HOSPITAL_COMMUNITY)
Admission: RE | Disposition: A | Discharge status: Home / Self Care | Payer: Self-pay | Source: Home / Self Care | Attending: Cardiovascular Disease

## 2021-01-09 ENCOUNTER — Encounter (HOSPITAL_COMMUNITY): Payer: Self-pay | Admitting: Cardiovascular Disease

## 2021-01-09 ENCOUNTER — Ambulatory Visit (HOSPITAL_COMMUNITY)
Admission: RE | Admit: 2021-01-09 | Discharge: 2021-01-09 | Disposition: A | Discharge status: Home / Self Care | Payer: Medicare Other | Attending: Cardiovascular Disease | Admitting: Cardiovascular Disease

## 2021-01-09 ENCOUNTER — Ambulatory Visit (HOSPITAL_COMMUNITY): Payer: Medicare Other | Admitting: Certified Registered Nurse Anesthetist

## 2021-01-09 DIAGNOSIS — Z8249 Family history of ischemic heart disease and other diseases of the circulatory system: Secondary | ICD-10-CM | POA: Insufficient documentation

## 2021-01-09 DIAGNOSIS — Z7901 Long term (current) use of anticoagulants: Secondary | ICD-10-CM | POA: Insufficient documentation

## 2021-01-09 DIAGNOSIS — E119 Type 2 diabetes mellitus without complications: Secondary | ICD-10-CM | POA: Diagnosis not present

## 2021-01-09 DIAGNOSIS — Z79899 Other long term (current) drug therapy: Secondary | ICD-10-CM | POA: Diagnosis not present

## 2021-01-09 DIAGNOSIS — E1159 Type 2 diabetes mellitus with other circulatory complications: Secondary | ICD-10-CM | POA: Diagnosis not present

## 2021-01-09 DIAGNOSIS — Z794 Long term (current) use of insulin: Secondary | ICD-10-CM | POA: Diagnosis not present

## 2021-01-09 DIAGNOSIS — I4811 Longstanding persistent atrial fibrillation: Secondary | ICD-10-CM | POA: Diagnosis not present

## 2021-01-09 DIAGNOSIS — Z880 Allergy status to penicillin: Secondary | ICD-10-CM | POA: Insufficient documentation

## 2021-01-09 DIAGNOSIS — I4819 Other persistent atrial fibrillation: Secondary | ICD-10-CM

## 2021-01-09 DIAGNOSIS — I452 Bifascicular block: Secondary | ICD-10-CM | POA: Diagnosis not present

## 2021-01-09 DIAGNOSIS — Z7984 Long term (current) use of oral hypoglycemic drugs: Secondary | ICD-10-CM | POA: Diagnosis not present

## 2021-01-09 DIAGNOSIS — E785 Hyperlipidemia, unspecified: Secondary | ICD-10-CM | POA: Diagnosis not present

## 2021-01-09 DIAGNOSIS — I10 Essential (primary) hypertension: Secondary | ICD-10-CM

## 2021-01-09 DIAGNOSIS — Z87891 Personal history of nicotine dependence: Secondary | ICD-10-CM | POA: Insufficient documentation

## 2021-01-09 DIAGNOSIS — Z6841 Body Mass Index (BMI) 40.0 and over, adult: Secondary | ICD-10-CM | POA: Diagnosis not present

## 2021-01-09 HISTORY — PX: CARDIOVERSION: SHX1299

## 2021-01-09 LAB — GLUCOSE, CAPILLARY: Glucose-Capillary: 98 mg/dL (ref 70–99)

## 2021-01-09 SURGERY — CARDIOVERSION
Anesthesia: General

## 2021-01-09 MED ORDER — SODIUM CHLORIDE 0.9 % IV SOLN: INTRAVENOUS | Status: DC

## 2021-01-09 MED ORDER — SODIUM CHLORIDE 0.9 % IV SOLN: INTRAVENOUS | Status: DC | PRN

## 2021-01-09 MED ORDER — FLUTICASONE PROPIONATE 50 MCG/ACT NA SUSP
2.0000 | Freq: Every day | NASAL | 3 refills | Status: AC
Start: 2021-01-09 — End: ?

## 2021-01-09 MED ORDER — LIDOCAINE HCL (CARDIAC) PF 100 MG/5ML IV SOSY
PREFILLED_SYRINGE | INTRAVENOUS | Status: DC | PRN
  Administered 2021-01-09: 60 mg via INTRATRACHEAL

## 2021-01-09 MED ORDER — PROPOFOL 10 MG/ML IV BOLUS
INTRAVENOUS | Status: DC | PRN
  Administered 2021-01-09: 50 mg via INTRAVENOUS

## 2021-01-09 MED ORDER — ATORVASTATIN CALCIUM 40 MG PO TABS: 1.0000 | ORAL_TABLET | Freq: Every day | ORAL | 3 refills | Status: AC

## 2021-01-09 MED ORDER — LISINOPRIL 20 MG PO TABS: 1.0000 | ORAL_TABLET | Freq: Every day | ORAL | 3 refills | Status: AC

## 2021-01-09 NOTE — Anesthesia Postprocedure Evaluation (Signed)
Anesthesia Post Note  Patient: Darius Ingram  Procedure(s) Performed: CARDIOVERSION     Patient location during evaluation: Endoscopy Anesthesia Type: General Level of consciousness: awake and alert Pain management: pain level controlled Vital Signs Assessment: post-procedure vital signs reviewed and stable Respiratory status: spontaneous breathing, nonlabored ventilation, respiratory function stable and patient connected to nasal cannula oxygen Cardiovascular status: blood pressure returned to baseline and stable Postop Assessment: no apparent nausea or vomiting Anesthetic complications: no   No notable events documented.  Last Vitals:  Vitals:   01/09/21 1310 01/09/21 1320  BP: (!) 147/90 (!) 147/78  Pulse: 65 62  Resp: 12 12  Temp:    SpO2: 97% 96%    Last Pain:  Vitals:   01/09/21 1320  TempSrc:   PainSc: 0-No pain                 Nashla Althoff L Camiah Humm

## 2021-01-09 NOTE — Anesthesia Procedure Notes (Signed)
Procedure Name: General with mask airway Date/Time: 01/09/2021 12:30 PM Performed by: Kathryne Hitch, CRNA Pre-anesthesia Checklist: Patient identified, Emergency Drugs available, Suction available, Patient being monitored and Timeout performed Patient Re-evaluated:Patient Re-evaluated prior to induction Oxygen Delivery Method: Simple face mask Preoxygenation: Pre-oxygenation with 100% oxygen Induction Type: IV induction Placement Confirmation: positive ETCO2 Dental Injury: Teeth and Oropharynx as per pre-operative assessment

## 2021-01-09 NOTE — Anesthesia Preprocedure Evaluation (Addendum)
Anesthesia Evaluation  Patient identified by MRN, date of birth, ID band Patient awake    Reviewed: Allergy & Precautions, NPO status , Patient's Chart, lab work & pertinent test results  Airway Mallampati: III  TM Distance: >3 FB Neck ROM: Full    Dental no notable dental hx. (+) Dental Advisory Given   Pulmonary neg pulmonary ROS, former smoker,    Pulmonary exam normal breath sounds clear to auscultation       Cardiovascular hypertension, Normal cardiovascular exam+ dysrhythmias (on eliquis, no missed doses) Atrial Fibrillation  Rhythm:Irregular Rate:Normal  TTE 2022 1. Abnormal septal motion inferior basal hypokinesis Abnormal GLS -13.3 .  Left ventricular ejection fraction, by estimation, is 45 to 50%. The left  ventricle has mildly decreased function. The left ventricle has no  regional wall motion abnormalities.  There is severe left ventricular hypertrophy. Left ventricular diastolic  parameters are indeterminate.  2. Right ventricular systolic function is normal. The right ventricular  size is normal.  3. Left atrial size was moderately dilated.  4. The mitral valve is normal in structure. Trivial mitral valve  regurgitation. No evidence of mitral stenosis.  5. The aortic valve is normal in structure. Aortic valve regurgitation is  not visualized. No aortic stenosis is present.  6. Aortic dilatation noted. There is mild dilatation of the ascending  aorta, measuring 38 mm.  7. The inferior vena cava is normal in size with greater than 50%  respiratory variability, suggesting right atrial pressure of 3 mmHg.    Neuro/Psych negative neurological ROS  negative psych ROS   GI/Hepatic negative GI ROS, Neg liver ROS,   Endo/Other  diabetes, Type 2, Oral Hypoglycemic AgentsHypothyroidism Morbid obesity (BMI 44)  Renal/GU negative Renal ROS  negative genitourinary   Musculoskeletal negative musculoskeletal  ROS (+)   Abdominal   Peds  Hematology negative hematology ROS (+)   Anesthesia Other Findings   Reproductive/Obstetrics                           Anesthesia Physical Anesthesia Plan  ASA: 3  Anesthesia Plan: General   Post-op Pain Management:    Induction: Intravenous  PONV Risk Score and Plan: 2 and Propofol infusion and Treatment may vary due to age or medical condition  Airway Management Planned: Natural Airway  Additional Equipment:   Intra-op Plan:   Post-operative Plan:   Informed Consent: I have reviewed the patients History and Physical, chart, labs and discussed the procedure including the risks, benefits and alternatives for the proposed anesthesia with the patient or authorized representative who has indicated his/her understanding and acceptance.     Dental advisory given  Plan Discussed with: CRNA  Anesthesia Plan Comments:         Anesthesia Quick Evaluation

## 2021-01-09 NOTE — Interval H&P Note (Signed)
History and Physical Interval Note:  01/09/2021 12:51 PM  Darius Ingram  has presented today for surgery, with the diagnosis of AFIB.  The various methods of treatment have been discussed with the patient and family. After consideration of risks, benefits and other options for treatment, the patient has consented to  Procedure(s): CARDIOVERSION (N/A) as a surgical intervention.  The patient's history has been reviewed, patient examined, no change in status, stable for surgery.  I have reviewed the patient's chart and labs.  Questions were answered to the patient's satisfaction.     [name], MD

## 2021-01-09 NOTE — CV Procedure (Signed)
Electrical Cardioversion Procedure Note Darius Ingram [id number] Jan 20, 1956  Procedure: Electrical Cardioversion Indications:  Atrial Fibrillation  Procedure Details Consent: Risks of procedure as well as the alternatives and risks of each were explained to the (patient/caregiver).  Consent for procedure obtained. Time Out: Verified patient identification, verified procedure, site/side was marked, verified correct patient position, special equipment/implants available, medications/allergies/relevent history reviewed, required imaging and test results available.  Performed  Patient placed on cardiac monitor, pulse oximetry, supplemental oxygen as necessary.  Sedation given:  profofol Pacer pads placed anterior and posterior chest.  Cardioverted 1 time(s).  Cardioverted at [place].  Evaluation Findings: Post procedure EKG shows: NSR Complications: None Patient did tolerate procedure well.   [name], MD 01/09/2021, 12:59 PM

## 2021-01-09 NOTE — Discharge Instructions (Signed)

## 2021-01-09 NOTE — Transfer of Care (Signed)
Immediate Anesthesia Transfer of Care Note  Patient: Darius Ingram  Procedure(s) Performed: CARDIOVERSION  Patient Location: Endoscopy Unit  Anesthesia Type:General  Level of Consciousness: drowsy and patient cooperative  Airway & Oxygen Therapy: Patient Spontanous Breathing and Patient connected to face mask oxygen  Post-op Assessment: Report given to RN and Post -op Vital signs reviewed and stable  Post vital signs: Reviewed and stable  Last Vitals:  Vitals Value Taken Time  BP 139/61   Temp    Pulse 65   Resp 23   SpO2 100     Last Pain:  Vitals:   01/09/21 1200  TempSrc: Oral  PainSc: 0-No pain         Complications: No notable events documented.

## 2021-01-12 NOTE — Procedures (Signed)
Patient Name: Darius Ingram, [place] Date: 12/20/2020 Gender: Male D.O.B: 1955-09-03 Age (years): 65 Referring Provider: Malka So PA Height (inches): 70 Interpreting Physician: Shelva Majestic MD, ABSM Weight (lbs): 336 RPSGT: Baxter Flattery BMI: 48 MRN: [id number] Neck Size: 22.50  CLINICAL INFORMATION Sleep Study Type: NPSG  Indication for sleep study: Congestive Heart Failure, Fatigue, Hypertension, Obesity, Snoring, Witnesses Apnea / Gasping During Sleep   Epworth Sleepiness Score: 8  SLEEP STUDY TECHNIQUE As per the AASM Manual for the Scoring of Sleep and Associated Events v2.3 (April 2016) with a hypopnea requiring 4% desaturations.  The channels recorded and monitored were frontal, central and occipital EEG, electrooculogram (EOG), submentalis EMG (chin), nasal and oral airflow, thoracic and abdominal wall motion, anterior tibialis EMG, snore microphone, electrocardiogram, and pulse oximetry.  MEDICATIONS acetaminophen (TYLENOL) 325 MG tablet amLODipine (NORVASC) 10 MG tablet apixaban (ELIQUIS) 5 MG TABS tablet atorvastatin (LIPITOR) 40 MG tablet calcium carbonate (TUMS EX) 750 MG chewable tablet cetirizine (ZYRTEC) 10 MG tablet Dulaglutide (TRULICITY) 2.42 PN/3.6RW SOPN fluticasone (FLONASE) 50 MCG/ACT nasal spray lisinopril (ZESTRIL) 20 MG tablet metFORMIN (GLUCOPHAGE) 1000 MG tablet (Expired) Multiple Vitamin (MULTIVITAMIN PO) Medications self-administered by patient taken the night of the study : N/A  SLEEP ARCHITECTURE The study was initiated at 10:11:55 PM and ended at 4:41:49 AM.  Sleep onset time was 65.3 minutes and the sleep efficiency was 38.8%%. The total sleep time was 151.1 minutes.  Stage REM latency was 179.0 minutes.  The patient spent 2.7%% of the night in stage N1 sleep, 61.5%% in stage N2 sleep, 0.0%% in stage N3 and 35.7% in REM.  Alpha intrusion was absent.  Supine sleep was 64.27%.  RESPIRATORY PARAMETERS The overall  apnea/hypopnea index (AHI) was 53.6 per hour. There were 130 total apneas, including 38 obstructive, 14 central and 78 mixed apneas. There were 5 hypopneas and 0 RERAs.  The AHI during Stage REM sleep was 48.9 per hour.  AHI while supine was 53.7 per hour.  The mean oxygen saturation was 83.6%. The minimum SpO2 during sleep was 52.0%.  Loud snoring was noted during this study.  CARDIAC DATA The 2 lead EKG demonstrated sinus rhythm. The mean heart rate was 79.8 beats per minute. Other EKG findings include: PVCs.  LEG MOVEMENT DATA The total PLMS were 0 with a resulting PLMS index of 0.0. Associated arousal with leg movement index was 0.0 .  IMPRESSIONS - Severe obstructive sleep apnea occurred during this study (AHI 53.6/h). - Mild central sleep apnea occurred during this study (CAI 5.6/h). - Severe oxygen desaturation to a nadir of 52.0%. - The patient snored with loud snoring volume. - EKG findings include atrial fibrillation, PVCs. - Clinically significant periodic limb movements did not occur during sleep. No significant associated arousals.  DIAGNOSIS - Obstructive Sleep Apnea (G47.33) - Central Sleep Apnea (G47.37) - Nocturnal Hypoxemia (G47.36)  RECOMMENDATIONS - Expeditious CPAP titration to determine optimal pressure required to alleviate sleep disordered breathing. BiPAP or ASV titration may be required to eliminate central sleep apnea. - Effort should be made to optimize nasal and oropharyngeal patency. - Avoid alcohol, sedatives and other CNS depressants that may worsen sleep apnea and disrupt normal sleep architecture. - Sleep hygiene should be reviewed to assess factors that may improve sleep quality. - Weight management (BMI 48) and regular exercise should be initiated or continued if appropriate.  [Electronically signed] 01/12/2021 04:56 PM  Shelva Majestic MD, [facility], ABSM Diplomate, American Board of Sleep Medicine   NPI: [id number]  [facility] PH: [phone]   FX: [phone] [place]

## 2021-01-16 ENCOUNTER — Other Ambulatory Visit: Payer: Self-pay

## 2021-01-16 ENCOUNTER — Encounter (INDEPENDENT_AMBULATORY_CARE_PROVIDER_SITE_OTHER): Payer: Self-pay | Admitting: Family Medicine

## 2021-01-16 ENCOUNTER — Ambulatory Visit (INDEPENDENT_AMBULATORY_CARE_PROVIDER_SITE_OTHER): Payer: Medicare Other | Admitting: Family Medicine

## 2021-01-16 VITALS — BP 118/79 | HR 71 | Temp 98.1°F | Ht 72.0 in | Wt 332.0 lb

## 2021-01-16 DIAGNOSIS — G4733 Obstructive sleep apnea (adult) (pediatric): Secondary | ICD-10-CM | POA: Diagnosis not present

## 2021-01-16 DIAGNOSIS — Z6841 Body Mass Index (BMI) 40.0 and over, adult: Secondary | ICD-10-CM

## 2021-01-16 DIAGNOSIS — E7849 Other hyperlipidemia: Secondary | ICD-10-CM

## 2021-01-16 DIAGNOSIS — E1169 Type 2 diabetes mellitus with other specified complication: Secondary | ICD-10-CM | POA: Diagnosis not present

## 2021-01-16 DIAGNOSIS — I4819 Other persistent atrial fibrillation: Secondary | ICD-10-CM | POA: Diagnosis not present

## 2021-01-17 ENCOUNTER — Encounter: Payer: Self-pay | Admitting: Emergency Medicine

## 2021-01-17 ENCOUNTER — Ambulatory Visit (INDEPENDENT_AMBULATORY_CARE_PROVIDER_SITE_OTHER): Payer: Medicare Other | Admitting: Emergency Medicine

## 2021-01-17 VITALS — BP 130/76 | HR 70 | Temp 97.9°F | Ht 72.0 in | Wt 337.2 lb

## 2021-01-17 DIAGNOSIS — I4891 Unspecified atrial fibrillation: Secondary | ICD-10-CM | POA: Diagnosis not present

## 2021-01-17 DIAGNOSIS — E1159 Type 2 diabetes mellitus with other circulatory complications: Secondary | ICD-10-CM | POA: Diagnosis not present

## 2021-01-17 DIAGNOSIS — I152 Hypertension secondary to endocrine disorders: Secondary | ICD-10-CM

## 2021-01-17 DIAGNOSIS — I4811 Longstanding persistent atrial fibrillation: Secondary | ICD-10-CM

## 2021-01-17 DIAGNOSIS — E669 Obesity, unspecified: Secondary | ICD-10-CM

## 2021-01-17 DIAGNOSIS — E1169 Type 2 diabetes mellitus with other specified complication: Secondary | ICD-10-CM

## 2021-01-17 LAB — POCT GLYCOSYLATED HEMOGLOBIN (HGB A1C): Hemoglobin A1C: 6.2 % — AB (ref 4.0–5.6)

## 2021-01-17 MED ORDER — TRULICITY 1.5 MG/0.5ML ~~LOC~~ SOAJ: 1.5000 mg | SUBCUTANEOUS | 5 refills | Status: AC

## 2021-01-17 NOTE — Progress Notes (Signed)
Darius Ingram 65 y.o.   Chief Complaint  Patient presents with   Hypertension    Follow up    Diabetes    HISTORY OF PRESENT ILLNESS: This is a 65 y.o. male with history of obesity, hypertension, diabetes here for follow-up. Has history of chronic atrial fibrillation on long-term anticoagulation therapy with Eliquis, recently successfully cardioverted. Was able to see medical weight management group yesterday. Also had sleep studies done but results not available yet. Summary of recent office visits as follows:  PLAN:    In order of problems listed above:   AFib: He has been on anticoagulation for the last 3 weeks.  No history of stroke/TIA.  Unclear whether he would have any functional improvement with return to normal rhythm.  We will schedule for cardioversion.  Discussed the purpose of the procedure and its potential benefits as well as risks and complications.  It will only be important to pursue maintenance of sinus rhythm if there is evidence of significant functional improvement after the cardioversion.  Impressed upon him the importance of uninterrupted anticoagulation before that procedure and for the month that follows it.  Reduce alcohol intake.  We will follow-up on the sleep study report. Anticoagulation: So far without any bleeding problems Bifascicular block: He has spontaneously well rate controlled atrial fibrillation as well as right bundle branch block and left anterior fascicular block, suggesting significant AV and intraventricular conduction abnormalities.  He may eventually require pacemaker therapy. Morbid obesity: will make atrial fibrillation more likely to recur. HTN: Well-controlled. HLP: No known CAD or PAD.  Acceptable LDL cholesterol of 93, but his HDL is quite low as 37.  Recommend weight loss and steadily increasing physical activity.  He is enrolled at [facility]. DM: Glycemic control with hemoglobin A1c of 7%.  ASSESSMENT:      1. Persistent atrial fibrillation (Darius Ingram)  2. Longstanding persistent atrial fibrillation (Darius Ingram)  3. Bifascicular block  4. Morbid obesity (Darius Ingram)  5. Essential hypertension  6. Dyslipidemia (high LDL; low HDL)  7. Diabetes mellitus type 2 in obese (HCC)     CHA2DS2-VASc Score = 3  This indicates a 3.2% annual risk of stroke. The patient's score is based upon: CHF History: No HTN History: Yes Diabetes History: Yes Stroke History: No Vascular Disease History: No Age Score: 1 Gender Score: 0  Echocardiogram 11/14/2020   1. Abnormal septal motion inferior basal hypokinesis Abnormal GLS -13.3 .  Left ventricular ejection fraction, by estimation, is 45 to 50%. The left  ventricle has mildly decreased function. The left ventricle has no  regional wall motion abnormalities.  There is severe left ventricular hypertrophy. Left ventricular diastolic  parameters are indeterminate.   2. Right ventricular systolic function is normal. The right ventricular  size is normal.   3. Left atrial size was moderately dilated.   4. The mitral valve is normal in structure. Trivial mitral valve  regurgitation. No evidence of mitral stenosis.   5. The aortic valve is normal in structure. Aortic valve regurgitation is  not visualized. No aortic stenosis is present.   6. Aortic dilatation noted. There is mild dilatation of the ascending  aorta, measuring 38 mm.   7. The inferior vena cava is normal in size with greater than 50%  respiratory variability, suggesting right atrial pressure of 3 mmHg  Darius Ingram is a 65 y.o. male with a hx of hypertension type 2 diabetes mellitus, hypercholesterolemia, mild left ventricular systolic dysfunction (ECHO 11/14/2020 EF 45-50%), recently diagnosed  with persistent atrial fibrillation since March 2022, during a routine evaluation at [facility].  He has been on anticoagulation with Eliquis only since  11/27/2020.  He had a sleep study  performed on 12/14/2020, report not yet available.   He feels well and walks about a mile a day without complaints of shortness of breath, palpitations, dizziness, syncope or chest discomfort.  He denies orthopnea, PND or lower extremity edema.  He is a snorer and has daytime hypersomnolence.  His BMI is over 45.  He has not had any falls, injuries or bleeding problems.  He denies claudication or focal neurological events.   Blood pressures currently well controlled on a combination of ACE inhibitor and dihydropyridine calcium channel blocker.  He is not on any rate control medications and has a spontaneously controlled ventricular rate at 80 bpm.   My last office visit notes as follows: ASSESSMENT & PLAN: Hypertension associated with diabetes (Darius Ingram) Well-controlled hypertension.  Continue present medications amlodipine 10 mg and lisinopril 20 mg daily. Continue daily baby aspirin. Continue atorvastatin 40 mg daily. Hemoglobin A1c at 7.0 today continue Metformin 1000 mg twice a day and increase dose of Trulicity to 1.5 mg weekly. Referred to medical weight management group for evaluation. Follow-up in 3 months.  Hypertension Pertinent negatives include no chest pain, headaches, palpitations or shortness of breath.  Diabetes Pertinent negatives for hypoglycemia include no dizziness or headaches. Pertinent negatives for diabetes include no chest pain.    Prior to Admission medications   Medication Sig Start Date End Date Taking? Authorizing Provider  acetaminophen (TYLENOL) 325 MG tablet Take 650 mg by mouth 2 (two) times daily.   Yes [provider]  amLODipine (NORVASC) 10 MG tablet Take 1 tablet (10 mg total) by mouth daily. 10/02/20  Yes Ovella Manygoats, Ines Bloomer, MD  apixaban (ELIQUIS) 5 MG TABS tablet Take 1 tablet (5 mg total) by mouth 2 (two) times daily. 11/14/20  Yes Fenton, Clint R, PA  atorvastatin (LIPITOR) 40 MG tablet Take 1 tablet (40 mg total) by mouth daily. 01/09/21  Yes  [name], MD  calcium carbonate (TUMS EX) 750 MG chewable tablet Chew 750-1,500 mg by mouth daily as needed for heartburn.   Yes [provider]  cetirizine (ZYRTEC) 10 MG tablet Take 1 tablet (10 mg total) by mouth daily. 01/21/18  Yes Jaynee Eagles, PA-C  Dulaglutide (TRULICITY) 0.35 KK/9.3GH SOPN Inject 0.75 mg into the skin every Saturday.   Yes [provider]  fluticasone (FLONASE) 50 MCG/ACT nasal spray Place 2 sprays into both nostrils daily. 01/09/21  Yes [name], MD  lisinopril (ZESTRIL) 20 MG tablet Take 1 tablet (20 mg total) by mouth daily. 01/09/21  Yes [name], MD  Multiple Vitamin (MULTIVITAMIN PO) Take 1 tablet by mouth daily.   Yes [provider]  metFORMIN (GLUCOPHAGE) 1000 MG tablet Take 1 tablet (1,000 mg total) by mouth 2 (two) times daily with a meal. 10/02/20 01/03/21  Horald Pollen, MD    Allergies  Allergen Reactions   Penicillins Rash    Reaction: 50 years ago    Patient Active Problem List   Diagnosis Date Noted   Atrial fibrillation ([place]) 12/31/2020   Food insecurity 12/31/2020   Bifascicular block 12/25/2020   Dyslipidemia (high LDL; low HDL) 12/25/2020   Obesity, diabetes, and hypertension syndrome ([place]) 12/25/2020   Class 3 severe obesity with serious comorbidity and body mass index (BMI) of 45.0 to 49.9 in adult [facility]) 11/15/2020  Visceral obesity 10/29/2020   Longstanding persistent atrial fibrillation ([place]) 10/15/2020   Secondary hypercoagulable state ([place]) 10/15/2020   Diabetes mellitus ([place]) 01/24/2020   Nuclear sclerotic cataract of both eyes 01/24/2020   Uncontrolled type 2 diabetes mellitus with hyperglycemia ([place]) 12/29/2019   Hypertension associated with diabetes ([place]) 12/29/2019   Body mass index (BMI) of 45.0-49.9 in adult [facility]) 12/29/2019    Past Medical History:  Diagnosis Date   Allergy    seasonal allergies   Diabetes mellitus without complication (HCC)    Hyperlipidemia     Hypertension    Hypothyroidism    Joint pain    SOBOE (shortness of breath on exertion)     Past Surgical History:  Procedure Laterality Date   CARDIOVERSION N/A 01/09/2021   Procedure: CARDIOVERSION;  Surgeon: [name], MD;  Location: [place];  Service: Cardiovascular;  Laterality: N/A;   TONSILECTOMY, ADENOIDECTOMY, BILATERAL [place]    Social History   Socioeconomic History   Marital status: Single    Spouse name: Not on file   Number of children: Not on file   Years of education: Not on file   Highest education level: Not on file  Occupational History   Occupation: Retired Biomedical scientist  Tobacco Use   Smoking status: Former    Packs/day: 1.00    Years: 10.00    Pack years: 10.00    Types: Cigarettes    Quit date: 12/29/1998    Years since quitting: 22.0   Smokeless tobacco: Never  Vaping Use   Vaping Use: Never used  Substance and Sexual Activity   Alcohol use: Yes    Alcohol/week: 1.0 standard drink    Types: 1 Cans of beer per week    Comment: occ   Drug use: No   Sexual activity: Never  Other Topics Concern   Not on file  Social History Narrative   Not on file   Social Determinants of Health   Financial Resource Strain: Not on file  Food Insecurity: Not on file  Transportation Needs: Not on file  Physical Activity: Not on file  Stress: Not on file  Social Connections: Not on file  Intimate Partner Violence: Not on file    Family History  Problem Relation Age of Onset   Diabetes Mother    Obesity Mother    Diabetes Father    Hypertension Father    Heart disease Father    Obesity Father    Colon polyps Neg Hx    Colon cancer Neg Hx    Esophageal cancer Neg Hx    Stomach cancer Neg Hx    Rectal cancer Neg Hx      Review of Systems  Constitutional: Negative.  Negative for chills and fever.  HENT: Negative.  Negative for congestion and sore throat.   Respiratory:  Negative.  Negative for cough and shortness of breath.   Cardiovascular: Negative.  Negative for chest pain and palpitations.  Gastrointestinal:  Negative for abdominal pain, diarrhea, nausea and vomiting.  Genitourinary: Negative.  Negative for dysuria and hematuria.  Skin: Negative.  Negative for rash.  Neurological:  Negative for dizziness and headaches.  All other systems reviewed and are negative.  Today's Vitals   01/17/21 1302  BP: 130/76  Pulse: 70  Temp: 97.9 F (36.6 C)  TempSrc: Oral  SpO2: 96%  Weight: (!) 337 lb 3.2 oz (153 kg)  Height: 6' (1.829  m)   Body mass index is 45.73 kg/m. Wt Readings from Last 3 Encounters:  01/17/21 (!) 337 lb 3.2 oz (153 kg)  01/16/21 (!) 332 lb (150.6 kg)  01/09/21 (!) 326 lb 1 oz (147.9 kg)    Physical Exam Vitals reviewed.  Constitutional:      Appearance: Normal appearance. He is obese.  HENT:     Head: Normocephalic.  Eyes:     Extraocular Movements: Extraocular movements intact.     Conjunctiva/sclera: Conjunctivae normal.     Pupils: Pupils are equal, round, and reactive to light.  Cardiovascular:     Rate and Rhythm: Normal rate and regular rhythm.     Pulses: Normal pulses.     Heart sounds: Normal heart sounds.  Pulmonary:     Effort: Pulmonary effort is normal.     Breath sounds: Normal breath sounds.  Musculoskeletal:     Cervical back: Normal range of motion and neck supple.  Skin:    General: Skin is warm and dry.     Capillary Refill: Capillary refill takes less than 2 seconds.  Neurological:     General: No focal deficit present.     Mental Status: He is alert and oriented to person, place, and time.  Psychiatric:        Mood and Affect: Mood normal.        Behavior: Behavior normal.   Results for orders placed or performed in visit on 01/17/21 (from the past 24 hour(s))  POCT glycosylated hemoglobin (Hb A1C)     Status: Abnormal   Collection Time: 01/17/21  1:27 PM  Result Value Ref Range    Hemoglobin A1C 6.2 (A) 4.0 - 5.6 %   HbA1c POC (<> result, manual entry)     HbA1c, POC (prediabetic range)     HbA1c, POC (controlled diabetic range)       ASSESSMENT & PLAN: A total of 30 minutes was spent with the patient and counseling/coordination of care regarding diabetes and hypertension and cardiovascular risks associated with these conditions, review of most recent office notes from cardiologist's, review of all medications, review of most recent echocardiogram, review of most recent blood work results including today's hemoglobin A1c, importance of weight reduction and benefits of exercise, education and nutrition, fall precautions given the fact that he is on long-term anticoagulation for the time being, prognosis, documentation and need for follow-up in 3 months.  Longstanding persistent atrial fibrillation (HCC) Status post cardioversion.  Regular rate seems to be in sinus rhythm. Has appointment with cardiologist tomorrow.  Obesity, diabetes, and hypertension syndrome ([place]) Established care with medical weight management clinic yesterday. Well-controlled diabetes with hemoglobin A1c of 6.2. Continue metformin 1000 mg twice a day and Trulicity 1.5 mg weekly. Continue atorvastatin 40 mg. Well-controlled hypertension.  Continue amlodipine 10 mg daily and lisinopril 20 mg daily. Follow-up in 3 months. Darius Ingram was seen today for hypertension and diabetes.  Diagnoses and all orders for this visit:  Obesity, diabetes, and hypertension syndrome ([place]) -     POCT glycosylated hemoglobin (Hb A1C) -     Dulaglutide (TRULICITY) 1.5 WP/8.0DX SOPN; Inject 1.5 mg into the skin once a week.  Atrial fibrillation status post cardioversion [facility])  Morbid obesity ([place])  Longstanding persistent atrial fibrillation [facility])  Patient Instructions  Diabetes Mellitus and Nutrition, Adult When you have diabetes, or diabetes mellitus, it is very important to have healthy eating habits because your  blood sugar (glucose) levels are greatly affected by what you eat  and drink. Eating healthy foods in the right amounts, at about the same times every day, can help you: Control your blood glucose. Lower your risk of heart disease. Improve your blood pressure. Reach or maintain a healthy weight. What can affect my meal plan? Every person with diabetes is different, and each person has different needs for a meal plan. Your health care provider may recommend that you work with a dietitian to make a meal plan that is best for you. Your meal plan may vary depending on factors such as: The calories you need. The medicines you take. Your weight. Your blood glucose, blood pressure, and cholesterol levels. Your activity level. Other health conditions you have, such as heart or kidney disease. How do carbohydrates affect me? Carbohydrates, also called carbs, affect your blood glucose level more than any other type of food. Eating carbs naturally raises the amount of glucose in your blood. Carb counting is a method for keeping track of how many carbs you eat. Counting carbs is important to keep your blood glucose at a healthy level,especially if you use insulin or take certain oral diabetes medicines. It is important to know how many carbs you can safely have in each meal. This is different for every person. Your dietitian can help you calculate how manycarbs you should have at each meal and for each snack. How does alcohol affect me? Alcohol can cause a sudden decrease in blood glucose (hypoglycemia), especially if you use insulin or take certain oral diabetes medicines. Hypoglycemia can be a life-threatening condition. Symptoms of hypoglycemia, such as sleepiness, dizziness, and confusion, are similar to symptoms of having too much alcohol. Do not drink alcohol if: Your health care provider tells you not to drink. You are pregnant, may be pregnant, or are planning to become pregnant. If you drink  alcohol: Do not drink on an empty stomach. Limit how much you use to: 0-1 drink a day for women. 0-2 drinks a day for men. Be aware of how much alcohol is in your drink. In the [place] one drink equals one 12 oz bottle of beer (355 mL), one 5 oz glass of wine (148 mL), or one 1 oz glass of hard liquor (44 mL). Keep yourself hydrated with water, diet soda, or unsweetened iced tea. Keep in mind that regular soda, juice, and other mixers may contain a lot of sugar and must be counted as carbs. What are tips for following this plan?  Reading food labels Start by checking the serving size on the "Nutrition Facts" label of packaged foods and drinks. The amount of calories, carbs, fats, and other nutrients listed on the label is based on one serving of the item. Many items contain more than one serving per package. Check the total grams (g) of carbs in one serving. You can calculate the number of servings of carbs in one serving by dividing the total carbs by 15. For example, if a food has 30 g of total carbs per serving, it would be equal to 2 servings of carbs. Check the number of grams (g) of saturated fats and trans fats in one serving. Choose foods that have a low amount or none of these fats. Check the number of milligrams (mg) of salt (sodium) in one serving. Most people should limit total sodium intake to less than 2,300 mg per day. Always check the nutrition information of foods labeled as "low-fat" or "nonfat." These foods may be higher in added sugar or refined carbs and should  be avoided. Talk to your dietitian to identify your daily goals for nutrients listed on the label. Shopping Avoid buying canned, pre-made, or processed foods. These foods tend to be high in fat, sodium, and added sugar. Shop around the outside edge of the grocery store. This is where you will most often find fresh fruits and vegetables, bulk grains, fresh meats, and fresh dairy. Cooking Use low-heat cooking methods,  such as baking, instead of high-heat cooking methods like deep frying. Cook using healthy oils, such as olive, canola, or sunflower oil. Avoid cooking with butter, cream, or high-fat meats. Meal planning Eat meals and snacks regularly, preferably at the same times every day. Avoid going long periods of time without eating. Eat foods that are high in fiber, such as fresh fruits, vegetables, beans, and whole grains. Talk with your dietitian about how many servings of carbs you can eat at each meal. Eat 4-6 oz (112-168 g) of lean protein each day, such as lean meat, chicken, fish, eggs, or tofu. One ounce (oz) of lean protein is equal to: 1 oz (28 g) of meat, chicken, or fish. 1 egg.  cup (62 g) of tofu. Eat some foods each day that contain healthy fats, such as avocado, nuts, seeds, and fish. What foods should I eat? Fruits Berries. Apples. Oranges. Peaches. Apricots. Plums. Grapes. Mango. Papaya.Pomegranate. Kiwi. Cherries. Vegetables Lettuce. Spinach. Leafy greens, including kale, chard, collard greens, and mustard greens. Beets. Cauliflower. Cabbage. Broccoli. Carrots. Green beans.Tomatoes. Peppers. Onions. Cucumbers. Brussels sprouts. Grains Whole grains, such as whole-wheat or whole-grain bread, crackers, tortillas,cereal, and pasta. Unsweetened oatmeal. Quinoa. Brown or wild rice. Meats and other proteins Seafood. Poultry without skin. Lean cuts of poultry and beef. Tofu. Nuts. Seeds. Dairy Low-fat or fat-free dairy products such as milk, yogurt, and cheese. The items listed above may not be a complete list of foods and beverages you can eat. Contact a dietitian for more information. What foods should I avoid? Fruits Fruits canned with syrup. Vegetables Canned vegetables. Frozen vegetables with butter or cream sauce. Grains Refined white flour and flour products such as bread, pasta, snack foods, andcereals. Avoid all processed foods. Meats and other proteins Fatty cuts of meat.  Poultry with skin. Breaded or fried meats. Processed meat.Avoid saturated fats. Dairy Full-fat yogurt, cheese, or milk. Beverages Sweetened drinks, such as soda or iced tea. The items listed above may not be a complete list of foods and beverages you should avoid. Contact a dietitian for more information. Questions to ask a health care provider Do I need to meet with a diabetes educator? Do I need to meet with a dietitian? What number can I call if I have questions? When are the best times to check my blood glucose? Where to find more information: American Diabetes Association: diabetes.org Academy of Nutrition and Dietetics: www.eatright.[employer] of Diabetes and Digestive and Kidney Diseases: [employer] Association of Diabetes Care and Education Specialists: www.diabeteseducator.org Summary It is important to have healthy eating habits because your blood sugar (glucose) levels are greatly affected by what you eat and drink. A healthy meal plan will help you control your blood glucose and maintain a healthy lifestyle. Your health care provider may recommend that you work with a dietitian to make a meal plan that is best for you. Keep in mind that carbohydrates (carbs) and alcohol have immediate effects on your blood glucose levels. It is important to count carbs and to use alcohol carefully. This information is not intended to replace advice given  to you by your health care provider. Make sure you discuss any questions you have with your healthcare provider. Document Revised: 06/21/2019 Document Reviewed: 06/21/2019 Elsevier Patient Education  2021 [place], MD [facility]

## 2021-01-17 NOTE — Assessment & Plan Note (Addendum)
Status post cardioversion.  Regular rate seems to be in sinus rhythm. Has appointment with cardiologist tomorrow.

## 2021-01-17 NOTE — Patient Instructions (Signed)
Diabetes Mellitus and Nutrition, Adult When you have diabetes, or diabetes mellitus, it is very important to have healthy eating habits because your blood sugar (glucose) levels are greatly affected by what you eat and drink. Eating healthy foods in the right amounts, at about the same times every day, can help you:  Control your blood glucose.  Lower your risk of heart disease.  Improve your blood pressure.  Reach or maintain a healthy weight. What can affect my meal plan? Every person with diabetes is different, and each person has different needs for a meal plan. Your health care provider may recommend that you work with a dietitian to make a meal plan that is best for you. Your meal plan may vary depending on factors such as:  The calories you need.  The medicines you take.  Your weight.  Your blood glucose, blood pressure, and cholesterol levels.  Your activity level.  Other health conditions you have, such as heart or kidney disease. How do carbohydrates affect me? Carbohydrates, also called carbs, affect your blood glucose level more than any other type of food. Eating carbs naturally raises the amount of glucose in your blood. Carb counting is a method for keeping track of how many carbs you eat. Counting carbs is important to keep your blood glucose at a healthy level, especially if you use insulin or take certain oral diabetes medicines. It is important to know how many carbs you can safely have in each meal. This is different for every person. Your dietitian can help you calculate how many carbs you should have at each meal and for each snack. How does alcohol affect me? Alcohol can cause a sudden decrease in blood glucose (hypoglycemia), especially if you use insulin or take certain oral diabetes medicines. Hypoglycemia can be a life-threatening condition. Symptoms of hypoglycemia, such as sleepiness, dizziness, and confusion, are similar to symptoms of having too much  alcohol.  Do not drink alcohol if: ? Your health care provider tells you not to drink. ? You are pregnant, may be pregnant, or are planning to become pregnant.  If you drink alcohol: ? Do not drink on an empty stomach. ? Limit how much you use to:  0-1 drink a day for women.  0-2 drinks a day for men. ? Be aware of how much alcohol is in your drink. In the [place] one drink equals one 12 oz bottle of beer (355 mL), one 5 oz glass of wine (148 mL), or one 1 oz glass of hard liquor (44 mL). ? Keep yourself hydrated with water, diet soda, or unsweetened iced tea.  Keep in mind that regular soda, juice, and other mixers may contain a lot of sugar and must be counted as carbs. What are tips for following this plan? Reading food labels  Start by checking the serving size on the "Nutrition Facts" label of packaged foods and drinks. The amount of calories, carbs, fats, and other nutrients listed on the label is based on one serving of the item. Many items contain more than one serving per package.  Check the total grams (g) of carbs in one serving. You can calculate the number of servings of carbs in one serving by dividing the total carbs by 15. For example, if a food has 30 g of total carbs per serving, it would be equal to 2 servings of carbs.  Check the number of grams (g) of saturated fats and trans fats in one serving. Choose foods that have   a low amount or none of these fats.  Check the number of milligrams (mg) of salt (sodium) in one serving. Most people should limit total sodium intake to less than 2,300 mg per day.  Always check the nutrition information of foods labeled as "low-fat" or "nonfat." These foods may be higher in added sugar or refined carbs and should be avoided.  Talk to your dietitian to identify your daily goals for nutrients listed on the label. Shopping  Avoid buying canned, pre-made, or processed foods. These foods tend to be high in fat, sodium, and added  sugar.  Shop around the outside edge of the grocery store. This is where you will most often find fresh fruits and vegetables, bulk grains, fresh meats, and fresh dairy. Cooking  Use low-heat cooking methods, such as baking, instead of high-heat cooking methods like deep frying.  Cook using healthy oils, such as olive, canola, or sunflower oil.  Avoid cooking with butter, cream, or high-fat meats. Meal planning  Eat meals and snacks regularly, preferably at the same times every day. Avoid going long periods of time without eating.  Eat foods that are high in fiber, such as fresh fruits, vegetables, beans, and whole grains. Talk with your dietitian about how many servings of carbs you can eat at each meal.  Eat 4-6 oz (112-168 g) of lean protein each day, such as lean meat, chicken, fish, eggs, or tofu. One ounce (oz) of lean protein is equal to: ? 1 oz (28 g) of meat, chicken, or fish. ? 1 egg. ?  cup (62 g) of tofu.  Eat some foods each day that contain healthy fats, such as avocado, nuts, seeds, and fish.   What foods should I eat? Fruits Berries. Apples. Oranges. Peaches. Apricots. Plums. Grapes. Mango. Papaya. Pomegranate. Kiwi. Cherries. Vegetables Lettuce. Spinach. Leafy greens, including kale, chard, collard greens, and mustard greens. Beets. Cauliflower. Cabbage. Broccoli. Carrots. Green beans. Tomatoes. Peppers. Onions. Cucumbers. Brussels sprouts. Grains Whole grains, such as whole-wheat or whole-grain bread, crackers, tortillas, cereal, and pasta. Unsweetened oatmeal. Quinoa. Brown or wild rice. Meats and other proteins Seafood. Poultry without skin. Lean cuts of poultry and beef. Tofu. Nuts. Seeds. Dairy Low-fat or fat-free dairy products such as milk, yogurt, and cheese. The items listed above may not be a complete list of foods and beverages you can eat. Contact a dietitian for more information. What foods should I avoid? Fruits Fruits canned with  syrup. Vegetables Canned vegetables. Frozen vegetables with butter or cream sauce. Grains Refined white flour and flour products such as bread, pasta, snack foods, and cereals. Avoid all processed foods. Meats and other proteins Fatty cuts of meat. Poultry with skin. Breaded or fried meats. Processed meat. Avoid saturated fats. Dairy Full-fat yogurt, cheese, or milk. Beverages Sweetened drinks, such as soda or iced tea. The items listed above may not be a complete list of foods and beverages you should avoid. Contact a dietitian for more information. Questions to ask a health care provider  Do I need to meet with a diabetes educator?  Do I need to meet with a dietitian?  What number can I call if I have questions?  When are the best times to check my blood glucose? Where to find more information:  American Diabetes Association: diabetes.org  Academy of Nutrition and Dietetics: www.eatright.org  National Institute of Diabetes and Digestive and Kidney Diseases: www.niddk.nih.gov  Association of Diabetes Care and Education Specialists: www.diabeteseducator.org Summary  It is important to have healthy eating   habits because your blood sugar (glucose) levels are greatly affected by what you eat and drink.  A healthy meal plan will help you control your blood glucose and maintain a healthy lifestyle.  Your health care provider may recommend that you work with a dietitian to make a meal plan that is best for you.  Keep in mind that carbohydrates (carbs) and alcohol have immediate effects on your blood glucose levels. It is important to count carbs and to use alcohol carefully. This information is not intended to replace advice given to you by your health care provider. Make sure you discuss any questions you have with your health care provider. Document Revised: 06/21/2019 Document Reviewed: 06/21/2019 Elsevier Patient Education  2021 Elsevier Inc.  

## 2021-01-17 NOTE — Assessment & Plan Note (Signed)
Established care with [facility] yesterday. Well-controlled diabetes with hemoglobin A1c of 6.2. Continue metformin 1000 mg twice a day and Trulicity 1.5 mg weekly. Continue atorvastatin 40 mg. Well-controlled hypertension.  Continue amlodipine 10 mg daily and lisinopril 20 mg daily. Follow-up in 3 months.

## 2021-01-18 ENCOUNTER — Other Ambulatory Visit: Payer: Self-pay

## 2021-01-18 ENCOUNTER — Encounter (HOSPITAL_COMMUNITY): Payer: Self-pay | Admitting: Physician Assistant

## 2021-01-18 ENCOUNTER — Ambulatory Visit (HOSPITAL_COMMUNITY)
Admission: RE | Admit: 2021-01-18 | Discharge: 2021-01-18 | Disposition: A | Discharge status: Home / Self Care | Payer: Medicare Other | Source: Ambulatory Visit | Attending: Physician Assistant | Admitting: Physician Assistant

## 2021-01-18 VITALS — BP 122/72 | HR 64 | Ht 72.0 in | Wt 334.0 lb

## 2021-01-18 DIAGNOSIS — I4819 Other persistent atrial fibrillation: Secondary | ICD-10-CM | POA: Diagnosis not present

## 2021-01-18 DIAGNOSIS — Z79899 Other long term (current) drug therapy: Secondary | ICD-10-CM | POA: Diagnosis not present

## 2021-01-18 DIAGNOSIS — Z7901 Long term (current) use of anticoagulants: Secondary | ICD-10-CM | POA: Diagnosis not present

## 2021-01-18 DIAGNOSIS — E119 Type 2 diabetes mellitus without complications: Secondary | ICD-10-CM | POA: Diagnosis not present

## 2021-01-18 DIAGNOSIS — Z8249 Family history of ischemic heart disease and other diseases of the circulatory system: Secondary | ICD-10-CM | POA: Insufficient documentation

## 2021-01-18 DIAGNOSIS — Z7984 Long term (current) use of oral hypoglycemic drugs: Secondary | ICD-10-CM | POA: Insufficient documentation

## 2021-01-18 DIAGNOSIS — G4733 Obstructive sleep apnea (adult) (pediatric): Secondary | ICD-10-CM | POA: Diagnosis not present

## 2021-01-18 DIAGNOSIS — E669 Obesity, unspecified: Secondary | ICD-10-CM | POA: Insufficient documentation

## 2021-01-18 DIAGNOSIS — D6869 Other thrombophilia: Secondary | ICD-10-CM | POA: Diagnosis not present

## 2021-01-18 DIAGNOSIS — Z6841 Body Mass Index (BMI) 40.0 and over, adult: Secondary | ICD-10-CM | POA: Insufficient documentation

## 2021-01-18 DIAGNOSIS — I110 Hypertensive heart disease with heart failure: Secondary | ICD-10-CM | POA: Diagnosis not present

## 2021-01-18 DIAGNOSIS — I5020 Unspecified systolic (congestive) heart failure: Secondary | ICD-10-CM | POA: Insufficient documentation

## 2021-01-18 NOTE — Progress Notes (Signed)
Primary Care Physician: Darius Pollen, MD Primary Cardiologist: Dr Sallyanne Kuster Primary Electrophysiologist: none Referring Physician: Dr Skip Mayer Darius Ingram is a 65 y.o. male with a history of HTN, DM, LVH, systolic dysfunction, and atrial fibrillation who presents for follow up in the [facility]. The patient was initially diagnosed with atrial fibrillation 10/11/20 incidentally at [facility]. ECG showed atrial fibrillation but patient was asymptomatic. Patient has a CHADS2VASC score of 3. He denies significant alcohol use but does admit to snoring, witnessed apnea, and daytime somnolence. Patient did not have DCCV on 11/20/20 because he had been off Eliquis stating he could not afford the medication. Echo showed EF 45-50%, inferior basal hypokinesis, severe LVH. He underwent DCCV on 01/09/21. A sleep study showed severe OSA.  On follow up today, patient does report that he feels less SOB while walking. He denies any bleeding issues on anticoagulation.   Today, he denies symptoms of palpitations, chest pain, shortness of breath, orthopnea, PND, lower extremity edema, dizziness, presyncope, syncope, bleeding, or neurologic sequela. The patient is tolerating medications without difficulties and is otherwise without complaint today.    Atrial Fibrillation Risk Factors:  he does have symptoms or diagnosis of sleep apnea. he does not have a history of rheumatic fever. he does not have a history of alcohol use. The patient does have a history of early familial atrial fibrillation or other arrhythmias. Father had afib.  he has a BMI of Body mass index is 45.3 kg/m.Marland Kitchen Filed Weights   01/18/21 1125  Weight: (!) 151.5 kg    Family History  Problem Relation Age of Onset   Diabetes Mother    Obesity Mother    Diabetes Father    Hypertension Father    Heart disease Father    Obesity Father    Colon polyps Neg Hx    Colon  cancer Neg Hx    Esophageal cancer Neg Hx    Stomach cancer Neg Hx    Rectal cancer Neg Hx      Atrial Fibrillation Management history:  Previous antiarrhythmic drugs: none Previous cardioversions: 01/09/21 Previous ablations: none CHADS2VASC score: 3 Anticoagulation history: Eliquis   Past Medical History:  Diagnosis Date   Allergy    seasonal allergies   Diabetes mellitus without complication (HCC)    Hyperlipidemia    Hypertension    Hypothyroidism    Joint pain    SOBOE (shortness of breath on exertion)    Past Surgical History:  Procedure Laterality Date   CARDIOVERSION N/A 01/09/2021   Procedure: CARDIOVERSION;  Surgeon: [name], MD;  Location: [facility] ENDOSCOPY;  Service: Cardiovascular;  Laterality: N/A;   TONSILECTOMY, ADENOIDECTOMY, BILATERAL MYRINGOTOMY AND TUBES  1960   TYMPANOSTOMY  1960   WISDOM TOOTH EXTRACTION  1978    Current Outpatient Medications  Medication Sig Dispense Refill   acetaminophen (TYLENOL) 325 MG tablet Take 650 mg by mouth 2 (two) times daily.     amLODipine (NORVASC) 10 MG tablet Take 1 tablet (10 mg total) by mouth daily. 90 tablet 3   apixaban (ELIQUIS) 5 MG TABS tablet Take 1 tablet (5 mg total) by mouth 2 (two) times daily. 60 tablet 3   atorvastatin (LIPITOR) 40 MG tablet Take 1 tablet (40 mg total) by mouth daily. 90 tablet 3   calcium carbonate (TUMS EX) 750 MG chewable tablet Chew 750-1,500 mg by mouth daily as needed for heartburn.     cetirizine (ZYRTEC) 10  MG tablet Take 1 tablet (10 mg total) by mouth daily. 90 tablet 3   Dulaglutide (TRULICITY) 1.5 SF/6.8LE SOPN Inject 1.5 mg into the skin once a week. 6 mL 5   fluticasone (FLONASE) 50 MCG/ACT nasal spray Place 2 sprays into both nostrils daily. 16 g 3   lisinopril (ZESTRIL) 20 MG tablet Take 1 tablet (20 mg total) by mouth daily. 90 tablet 3   metFORMIN (GLUCOPHAGE) 1000 MG tablet Take 1 tablet (1,000 mg total) by mouth 2 (two) times daily with a meal. 180 tablet 3    Multiple Vitamin (MULTIVITAMIN PO) Take 1 tablet by mouth daily.     No current facility-administered medications for this encounter.    Allergies  Allergen Reactions   Penicillins Rash    Reaction: 50 years ago    Social History   Socioeconomic History   Marital status: Single    Spouse name: Not on file   Number of children: Not on file   Years of education: Not on file   Highest education level: Not on file  Occupational History   Occupation: Retired Biomedical scientist  Tobacco Use   Smoking status: Former    Packs/day: 1.00    Years: 10.00    Pack years: 10.00    Types: Cigarettes    Quit date: 12/29/1998    Years since quitting: 22.0   Smokeless tobacco: Never  Vaping Use   Vaping Use: Never used  Substance and Sexual Activity   Alcohol use: Yes    Alcohol/week: 1.0 standard drink    Types: 1 Cans of beer per week    Comment: occ   Drug use: No   Sexual activity: Never  Other Topics Concern   Not on file  Social History Narrative   Not on file   Social Determinants of Health   Financial Resource Strain: Not on file  Food Insecurity: Not on file  Transportation Needs: Not on file  Physical Activity: Not on file  Stress: Not on file  Social Connections: Not on file  Intimate Partner Violence: Not on file     ROS- All systems are reviewed and negative except as per the HPI above.  Physical Exam: Vitals:   01/18/21 1125  BP: 122/72  Pulse: 64  Weight: (!) 151.5 kg  Height: 6' (1.829 m)    GEN- The patient is a well appearing obese male, alert and oriented x 3 today.   HEENT-head normocephalic, atraumatic, sclera clear, conjunctiva pink, hearing intact, trachea midline. Lungs- Clear to ausculation bilaterally, normal work of breathing Heart- Regular rate and rhythm, no murmurs, rubs or gallops  GI- soft, NT, ND, + BS Extremities- no clubbing, cyanosis, or edema MS- no significant deformity or atrophy Skin- no rash or lesion Psych- euthymic mood, full  affect Neuro- strength and sensation are intact   Wt Readings from Last 3 Encounters:  01/18/21 (!) 151.5 kg  01/17/21 (!) 153 kg  01/16/21 (!) 150.6 kg    EKG today demonstrates  SR, RBBB, LAFB Vent. rate 64 BPM PR interval 186 ms QRS duration 174 ms QT/QTcB 480/495 ms   Echo 11/14/20 1. Abnormal septal motion inferior basal hypokinesis Abnormal GLS -13.3 .  Left ventricular ejection fraction, by estimation, is 45 to 50%. The left  ventricle has mildly decreased function. The left ventricle has no  regional wall motion abnormalities.  There is severe left ventricular hypertrophy. Left ventricular diastolic  parameters are indeterminate.   2. Right ventricular systolic function is normal. The  right ventricular  size is normal.   3. Left atrial size was moderately dilated.   4. The mitral valve is normal in structure. Trivial mitral valve  regurgitation. No evidence of mitral stenosis.   5. The aortic valve is normal in structure. Aortic valve regurgitation is  not visualized. No aortic stenosis is present.   6. Aortic dilatation noted. There is mild dilatation of the ascending  aorta, measuring 38 mm.   7. The inferior vena cava is normal in size with greater than 50%  respiratory variability, suggesting right atrial pressure of 3 mmHg.   Epic records are reviewed at length today  CHA2DS2-VASc Score = 3  The patient's score is based upon: CHF History: No HTN History: Yes Diabetes History: Yes Stroke History: No Vascular Disease History: No Age Score: 1 Gender Score: 0      ASSESSMENT AND PLAN: 1. Persistent Atrial Fibrillation The patient's CHA2DS2-VASc score is 3, indicating a 3.2% annual risk of stroke.   S/p DCCV 01/09/21 Patient appears to be maintaining SR. Continue Eliquis 5 mg BID  2. Secondary Hypercoagulable State (ICD10:  D68.69) The patient is at significant risk for stroke/thromboembolism based upon his CHA2DS2-VASc Score of 3.  Continue Apixaban  (Eliquis).   3. Obesity Body mass index is 45.3 kg/m. Lifestyle modification was discussed and encouraged including regular physical activity and weight reduction. [facility].  4. Severe OSA Patient waiting on CPAP equipment.  5. HTN Stable, no changes today.  6. Systolic dysfunction EF 76-80%, inferior basal hypokinesis No signs or symptoms of fluid overload. Hopefully this improves with SR. Defer ischemic workup to primary cardiologist if felt necessary.    Follow up with Dr Sallyanne Kuster in 3-4 months.    [facility] [address] [place], [place] [address] [phone] 01/18/2021 12:15 PM

## 2021-01-23 NOTE — Progress Notes (Signed)
Chief Complaint:   OBESITY Darius Ingram is here to discuss his progress with his obesity treatment plan along with follow-up of his obesity related diagnoses.   Today's visit was #: 6 Starting weight: 339 lbs Starting date: 10/11/2020 Today's weight: 332 lbs Today's date: 01/16/2021 Weight change since last visit: 6 lbs Total lbs lost to date: 7 lbs Body mass index is 45.03 kg/m.  Total weight loss percentage to date: -2.06%  Interim History: Darius Ingram had cardioversion on 01/09/2021.  Darius Ingram will see his PCP tomorrow, and Cardiology on Friday.  Darius Ingram has decreased his bread intake.  Drinks Gatorade Zero.  Darius Ingram says Darius Ingram has been stress eating.  Current Meal Plan: practicing portion control and making smarter food choices, such as increasing vegetables and decreasing simple carbohydrates for 90% of the time.  Current Exercise Plan: Walking 1-2 miles 4 times per week. Current Anti-Obesity Medications: Trulicity 4.08 mg subcutaneously weekly. Side effects: None.  Assessment/Plan:   1. Type 2 diabetes mellitus with other specified complication, without long-term current use of insulin (HCC) Diabetes Mellitus: Not at goal. Medication: Trulicity 1.44 mg subcutaneously weekly and metformin 1,000 mg twice daily. Issues reviewed: blood sugar goals, complications of diabetes mellitus, hypoglycemia prevention and treatment, exercise, and nutrition.   Plan:  Increase Trulicity with PCP.  Darius Ingram will ask for help with patient assistance. The patient will continue to focus on protein-rich, low simple carbohydrate foods. We reviewed the importance of hydration, regular exercise for stress reduction, and restorative sleep.   Lab Results  Component Value Date   HGBA1C 6.2 (A) 01/17/2021   HGBA1C 7.0 (A) 10/02/2020   HGBA1C 6.4 (A) 07/04/2020   Lab Results  Component Value Date   LDLCALC 93 10/02/2020   CREATININE 1.10 01/03/2021   2. OSA (obstructive sleep apnea) OSA is a cause of systemic hypertension and is  associated with an increased incidence of stroke, heart failure, atrial fibrillation, and coronary heart disease. Severe OSA increases all-cause mortality and cardiovascular mortality.   Goal: Treatment of OSA via CPAP compliance and weight loss. Plasma ghrelin levels (appetite or "hunger hormone") are significantly higher in OSA patients than in BMI-matched controls, but decrease to levels similar to those of obese patients without OSA after CPAP treatment.  Weight loss improves OSA by several mechanisms, including reduction in fatty tissue in the throat (i.e. parapharyngeal fat) and the tongue. Loss of abdominal fat increases mediastinal traction on the upper airway making it less likely to collapse during sleep. Studies have also shown that compliance with CPAP treatment improves leptin (hunger inhibitory hormone) imbalance.  3. Persistent atrial fibrillation ([place]) Had cardioversion on 01/08/2021.  Taking Eliquis 5 mg twice daily.  Followed by Cardiology.   4. Other hyperlipidemia Course: Not optimized. Lipid-lowering medications: Lipitor 40 mg daily.   Plan: Dietary changes: Increase soluble fiber, decrease simple carbohydrates, decrease saturated fat. Exercise changes: Moderate to vigorous-intensity aerobic activity 150 minutes per week or as tolerated. We will continue to monitor along with PCP/specialists as it pertains to his weight loss journey.  Lab Results  Component Value Date   CHOL 152 10/02/2020   HDL 37 (L) 10/02/2020   LDLCALC 93 10/02/2020   TRIG 123 10/02/2020   CHOLHDL 4.1 10/02/2020   Lab Results  Component Value Date   ALT 16 04/03/2020   AST 16 04/03/2020   ALKPHOS 85 04/03/2020   BILITOT 0.5 04/03/2020   The 10-year ASCVD risk score Mikey Bussing DC Jr., et al., 2013) is: 26%   Values  used to calculate the score:     Age: 65 years     Sex: Male     Is Non-Hispanic African American: No     Diabetic: Yes     Tobacco smoker: No     Systolic Blood Pressure: 382 mmHg      Is BP treated: Yes     HDL Cholesterol: 37 mg/dL     Total Cholesterol: 152 mg/dL  5. Obesity, current BMI 45.1  Course: Darius Ingram is currently in the action stage of change. As such, his goal is to continue with weight loss efforts.   Nutrition goals: Darius Ingram has agreed to practicing portion control and making smarter food choices, such as increasing vegetables and decreasing simple carbohydrates.  Low salt.  [place] sodium handout provided.  Exercise goals:  As is.  Behavioral modification strategies: increasing lean protein intake, decreasing simple carbohydrates, increasing vegetables, increasing water intake, decreasing sodium intake, and increasing high fiber foods.  Darius Ingram has agreed to follow-up with our clinic in 3 weeks. Darius Ingram was informed of the importance of frequent follow-up visits to maximize his success with intensive lifestyle modifications for his multiple health conditions.   Objective:   Blood pressure 118/79, pulse 71, temperature 98.1 F (36.7 C), temperature source Oral, height 6' (1.829 m), weight (!) 332 lb (150.6 kg), SpO2 95 %. Body mass index is 45.03 kg/m.  General: Cooperative, alert, well developed, in no acute distress. HEENT: Conjunctivae and lids unremarkable. Cardiovascular: Regular rhythm.  Lungs: Normal work of breathing. Neurologic: No focal deficits.   Lab Results  Component Value Date   CREATININE 1.10 01/03/2021   BUN 22 01/03/2021   NA 143 01/03/2021   K 3.7 01/03/2021   CL 102 01/03/2021   CO2 24 01/03/2021   Lab Results  Component Value Date   ALT 16 04/03/2020   AST 16 04/03/2020   ALKPHOS 85 04/03/2020   BILITOT 0.5 04/03/2020   Lab Results  Component Value Date   HGBA1C 6.2 (A) 01/17/2021   HGBA1C 7.0 (A) 10/02/2020   HGBA1C 6.4 (A) 07/04/2020   HGBA1C 6.9 (A) 04/03/2020   HGBA1C 9.9 (A) 12/29/2019   Lab Results  Component Value Date   CHOL 152 10/02/2020   HDL 37 (L) 10/02/2020   LDLCALC 93 10/02/2020   TRIG 123 10/02/2020    CHOLHDL 4.1 10/02/2020   Lab Results  Component Value Date   WBC 10.3 01/03/2021   HGB 14.5 01/03/2021   HCT 43.3 01/03/2021   MCV 89 01/03/2021   PLT 277 01/03/2021   Obesity Behavioral Intervention:   Approximately 15 minutes were spent on the discussion below.  ASK: We discussed the diagnosis of obesity with Darius Ingram today and Darius Ingram agreed to give [place] permission to discuss obesity behavioral modification therapy today.  ASSESS: Darius Ingram has the diagnosis of obesity and his BMI today is 45.1. Darius Ingram is in the action stage of change.   ADVISE: Darius Ingram was educated on the multiple health risks of obesity as well as the benefit of weight loss to improve his health. Darius Ingram was advised of the need for long term treatment and the importance of lifestyle modifications to improve his current health and to decrease his risk of future health problems.  AGREE: Multiple dietary modification options and treatment options were discussed and Darius Ingram agreed to follow the recommendations documented in the above note.  ARRANGE: Darius Ingram was educated on the importance of frequent visits to treat obesity as outlined per CMS and USPSTF guidelines and agreed  to schedule his next follow up appointment today.  Attestation Statements:   Reviewed by clinician on day of visit: allergies, medications, problem list, medical history, surgical history, family history, social history, and previous encounter notes.  I, Water quality scientist, CMA, am acting as transcriptionist for Briscoe Deutscher, DO  I have reviewed the above documentation for accuracy and completeness, and I agree with the above. Briscoe Deutscher, DO

## 2021-02-06 ENCOUNTER — Ambulatory Visit (INDEPENDENT_AMBULATORY_CARE_PROVIDER_SITE_OTHER): Payer: Medicare Other | Admitting: Family Medicine

## 2021-03-14 ENCOUNTER — Telehealth: Payer: Self-pay | Admitting: Cardiovascular Disease

## 2021-03-14 MED ORDER — APIXABAN 5 MG PO TABS: 5.0000 mg | ORAL_TABLET | Freq: Two times a day (BID) | ORAL | 1 refills | Status: DC

## 2021-03-14 NOTE — Telephone Encounter (Signed)
Prescription refill request for Eliquis received. Indication:afib Last office visit:fenton 01/18/21 Scr:1.1 01/03/21 Age: 4mWeight:153kg

## 2021-03-14 NOTE — Telephone Encounter (Signed)
*  STAT* If patient is at the pharmacy, call can be transferred to refill team.   1. Which medications need to be refilled? (please list name of each medication and dose if known)  apixaban (ELIQUIS) 5 MG TABS tablet  2. Which pharmacy/location (including street and city if local pharmacy) is medication to be sent to?  [place], [address]  3. Do they need a 30 day or 90 day supply? [place]

## 2021-04-22 ENCOUNTER — Other Ambulatory Visit: Payer: Self-pay

## 2021-04-22 ENCOUNTER — Ambulatory Visit (INDEPENDENT_AMBULATORY_CARE_PROVIDER_SITE_OTHER): Payer: Medicare Other | Admitting: Emergency Medicine

## 2021-04-22 ENCOUNTER — Encounter: Payer: Self-pay | Admitting: Emergency Medicine

## 2021-04-22 VITALS — BP 120/82 | HR 86 | Temp 97.9°F | Ht 72.0 in | Wt 334.0 lb

## 2021-04-22 DIAGNOSIS — E1169 Type 2 diabetes mellitus with other specified complication: Secondary | ICD-10-CM

## 2021-04-22 DIAGNOSIS — E669 Obesity, unspecified: Secondary | ICD-10-CM

## 2021-04-22 DIAGNOSIS — Z23 Encounter for immunization: Secondary | ICD-10-CM

## 2021-04-22 DIAGNOSIS — I152 Hypertension secondary to endocrine disorders: Secondary | ICD-10-CM

## 2021-04-22 DIAGNOSIS — I4811 Longstanding persistent atrial fibrillation: Secondary | ICD-10-CM | POA: Diagnosis not present

## 2021-04-22 DIAGNOSIS — E1159 Type 2 diabetes mellitus with other circulatory complications: Secondary | ICD-10-CM | POA: Diagnosis not present

## 2021-04-22 LAB — POCT GLYCOSYLATED HEMOGLOBIN (HGB A1C): Hemoglobin A1C: 6.1 % — AB (ref 4.0–5.6)

## 2021-04-22 NOTE — Patient Instructions (Signed)
Diabetes Mellitus and Nutrition, Adult When you have diabetes, or diabetes mellitus, it is very important to have healthy eating habits because your blood sugar (glucose) levels are greatly affected by what you eat and drink. Eating healthy foods in the right amounts, at about the same times every day, can help you:  Control your blood glucose.  Lower your risk of heart disease.  Improve your blood pressure.  Reach or maintain a healthy weight. What can affect my meal plan? Every person with diabetes is different, and each person has different needs for a meal plan. Your health care provider may recommend that you work with a dietitian to make a meal plan that is best for you. Your meal plan may vary depending on factors such as:  The calories you need.  The medicines you take.  Your weight.  Your blood glucose, blood pressure, and cholesterol levels.  Your activity level.  Other health conditions you have, such as heart or kidney disease. How do carbohydrates affect me? Carbohydrates, also called carbs, affect your blood glucose level more than any other type of food. Eating carbs naturally raises the amount of glucose in your blood. Carb counting is a method for keeping track of how many carbs you eat. Counting carbs is important to keep your blood glucose at a healthy level, especially if you use insulin or take certain oral diabetes medicines. It is important to know how many carbs you can safely have in each meal. This is different for every person. Your dietitian can help you calculate how many carbs you should have at each meal and for each snack. How does alcohol affect me? Alcohol can cause a sudden decrease in blood glucose (hypoglycemia), especially if you use insulin or take certain oral diabetes medicines. Hypoglycemia can be a life-threatening condition. Symptoms of hypoglycemia, such as sleepiness, dizziness, and confusion, are similar to symptoms of having too much  alcohol.  Do not drink alcohol if: ? Your health care provider tells you not to drink. ? You are pregnant, may be pregnant, or are planning to become pregnant.  If you drink alcohol: ? Do not drink on an empty stomach. ? Limit how much you use to:  0-1 drink a day for women.  0-2 drinks a day for men. ? Be aware of how much alcohol is in your drink. In the [place] one drink equals one 12 oz bottle of beer (355 mL), one 5 oz glass of wine (148 mL), or one 1 oz glass of hard liquor (44 mL). ? Keep yourself hydrated with water, diet soda, or unsweetened iced tea.  Keep in mind that regular soda, juice, and other mixers may contain a lot of sugar and must be counted as carbs. What are tips for following this plan? Reading food labels  Start by checking the serving size on the "Nutrition Facts" label of packaged foods and drinks. The amount of calories, carbs, fats, and other nutrients listed on the label is based on one serving of the item. Many items contain more than one serving per package.  Check the total grams (g) of carbs in one serving. You can calculate the number of servings of carbs in one serving by dividing the total carbs by 15. For example, if a food has 30 g of total carbs per serving, it would be equal to 2 servings of carbs.  Check the number of grams (g) of saturated fats and trans fats in one serving. Choose foods that have   a low amount or none of these fats.  Check the number of milligrams (mg) of salt (sodium) in one serving. Most people should limit total sodium intake to less than 2,300 mg per day.  Always check the nutrition information of foods labeled as "low-fat" or "nonfat." These foods may be higher in added sugar or refined carbs and should be avoided.  Talk to your dietitian to identify your daily goals for nutrients listed on the label. Shopping  Avoid buying canned, pre-made, or processed foods. These foods tend to be high in fat, sodium, and added  sugar.  Shop around the outside edge of the grocery store. This is where you will most often find fresh fruits and vegetables, bulk grains, fresh meats, and fresh dairy. Cooking  Use low-heat cooking methods, such as baking, instead of high-heat cooking methods like deep frying.  Cook using healthy oils, such as olive, canola, or sunflower oil.  Avoid cooking with butter, cream, or high-fat meats. Meal planning  Eat meals and snacks regularly, preferably at the same times every day. Avoid going long periods of time without eating.  Eat foods that are high in fiber, such as fresh fruits, vegetables, beans, and whole grains. Talk with your dietitian about how many servings of carbs you can eat at each meal.  Eat 4-6 oz (112-168 g) of lean protein each day, such as lean meat, chicken, fish, eggs, or tofu. One ounce (oz) of lean protein is equal to: ? 1 oz (28 g) of meat, chicken, or fish. ? 1 egg. ?  cup (62 g) of tofu.  Eat some foods each day that contain healthy fats, such as avocado, nuts, seeds, and fish.   What foods should I eat? Fruits Berries. Apples. Oranges. Peaches. Apricots. Plums. Grapes. Mango. Papaya. Pomegranate. Kiwi. Cherries. Vegetables Lettuce. Spinach. Leafy greens, including kale, chard, collard greens, and mustard greens. Beets. Cauliflower. Cabbage. Broccoli. Carrots. Green beans. Tomatoes. Peppers. Onions. Cucumbers. Brussels sprouts. Grains Whole grains, such as whole-wheat or whole-grain bread, crackers, tortillas, cereal, and pasta. Unsweetened oatmeal. Quinoa. Brown or wild rice. Meats and other proteins Seafood. Poultry without skin. Lean cuts of poultry and beef. Tofu. Nuts. Seeds. Dairy Low-fat or fat-free dairy products such as milk, yogurt, and cheese. The items listed above may not be a complete list of foods and beverages you can eat. Contact a dietitian for more information. What foods should I avoid? Fruits Fruits canned with  syrup. Vegetables Canned vegetables. Frozen vegetables with butter or cream sauce. Grains Refined white flour and flour products such as bread, pasta, snack foods, and cereals. Avoid all processed foods. Meats and other proteins Fatty cuts of meat. Poultry with skin. Breaded or fried meats. Processed meat. Avoid saturated fats. Dairy Full-fat yogurt, cheese, or milk. Beverages Sweetened drinks, such as soda or iced tea. The items listed above may not be a complete list of foods and beverages you should avoid. Contact a dietitian for more information. Questions to ask a health care provider  Do I need to meet with a diabetes educator?  Do I need to meet with a dietitian?  What number can I call if I have questions?  When are the best times to check my blood glucose? Where to find more information:  American Diabetes Association: diabetes.org  Academy of Nutrition and Dietetics: www.eatright.org  National Institute of Diabetes and Digestive and Kidney Diseases: www.niddk.nih.gov  Association of Diabetes Care and Education Specialists: www.diabeteseducator.org Summary  It is important to have healthy eating   habits because your blood sugar (glucose) levels are greatly affected by what you eat and drink.  A healthy meal plan will help you control your blood glucose and maintain a healthy lifestyle.  Your health care provider may recommend that you work with a dietitian to make a meal plan that is best for you.  Keep in mind that carbohydrates (carbs) and alcohol have immediate effects on your blood glucose levels. It is important to count carbs and to use alcohol carefully. This information is not intended to replace advice given to you by your health care provider. Make sure you discuss any questions you have with your health care provider. Document Revised: 06/21/2019 Document Reviewed: 06/21/2019 Elsevier Patient Education  2021 Elsevier Inc.  

## 2021-04-22 NOTE — Progress Notes (Signed)
Darius Ingram 65 y.o.   Chief Complaint  Patient presents with   Hypertension    F/U, on BP and diabetes    HISTORY OF PRESENT ILLNESS: This is a 65 y.o. male with history of hypertension and diabetes here for follow-up. #1 diabetes: On Trulicity 1.5 mg weekly and metformin 1000 mg twice a day #2 hypertension: On amlodipine 10 mg daily, and lisinopril 20 mg daily. #3 dyslipidemia: Atorvastatin 40 mg daily. #4 history of atrial fibrillation on Eliquis 5 mg twice a day Doing well.  Has no complaints or medical concerns today. Unable to lose significant weight.  No longer seeing medical weight management clinic due to financial reasons.  Hypertension Pertinent negatives include no chest pain, headaches, palpitations or shortness of breath.    Prior to Admission medications   Medication Sig Start Date End Date Taking? Authorizing Provider  acetaminophen (TYLENOL) 325 MG tablet Take 650 mg by mouth 2 (two) times daily.   Yes [provider]  amLODipine (NORVASC) 10 MG tablet Take 1 tablet (10 mg total) by mouth daily. 10/02/20  Yes Jamareon Shimel, Ines Bloomer, MD  apixaban (ELIQUIS) 5 MG TABS tablet Take 1 tablet (5 mg total) by mouth 2 (two) times daily. 03/14/21  Yes Fenton, Clint R, PA  atorvastatin (LIPITOR) 40 MG tablet Take 1 tablet (40 mg total) by mouth daily. 01/09/21  Yes [name], MD  calcium carbonate (TUMS EX) 750 MG chewable tablet Chew 750-1,500 mg by mouth daily as needed for heartburn.   Yes [provider]  cetirizine (ZYRTEC) 10 MG tablet Take 1 tablet (10 mg total) by mouth daily. 01/21/18  Yes Jaynee Eagles, PA-C  Dulaglutide (TRULICITY) 1.5 ZL/9.3TT SOPN Inject 1.5 mg into the skin once a week. 01/17/21  Yes Adriauna Campton, Ines Bloomer, MD  fluticasone [facility]) 50 MCG/ACT nasal spray Place 2 sprays into both nostrils daily. 01/09/21  Yes [name], MD  lisinopril (ZESTRIL) 20 MG tablet Take 1 tablet (20 mg total) by mouth daily. 01/09/21  Yes  [name], MD  Multiple Vitamin (MULTIVITAMIN PO) Take 1 tablet by mouth daily.   Yes [provider]  metFORMIN (GLUCOPHAGE) 1000 MG tablet Take 1 tablet (1,000 mg total) by mouth 2 (two) times daily with a meal. 10/02/20 01/18/21  Horald Pollen, MD    Allergies  Allergen Reactions   Penicillins Rash    Reaction: 50 years ago    Patient Active Problem List   Diagnosis Date Noted   Atrial fibrillation ([place]) 12/31/2020   Food insecurity 12/31/2020   Bifascicular block 12/25/2020   Dyslipidemia (high LDL; low HDL) 12/25/2020   Obesity, diabetes, and hypertension syndrome ([place]) 12/25/2020   Class 3 severe obesity with serious comorbidity and body mass index (BMI) of 45.0 to 49.9 in adult ([place]) 11/15/2020   Visceral obesity 10/29/2020   Longstanding persistent atrial fibrillation ([place]) 10/15/2020   Secondary hypercoagulable state ([place]) 10/15/2020   Diabetes mellitus ([place]) 01/24/2020   Nuclear sclerotic cataract of both eyes 01/24/2020   Uncontrolled type 2 diabetes mellitus with hyperglycemia ([place]) 12/29/2019   Hypertension associated with diabetes ([place]) 12/29/2019   Body mass index (BMI) of 45.0-49.9 in adult [facility]) 12/29/2019    Past Medical History:  Diagnosis Date   Allergy    seasonal allergies   Diabetes mellitus without complication ([place])    Hyperlipidemia    Hypertension    Hypothyroidism    Joint pain    SOBOE (shortness of breath on exertion)     Past Surgical  History:  Procedure Laterality Date   CARDIOVERSION N/A 01/09/2021   Procedure: CARDIOVERSION;  Surgeon: [name], MD;  Location: [facility] ENDOSCOPY;  Service: Cardiovascular;  Laterality: N/A;   TONSILECTOMY, ADENOIDECTOMY, BILATERAL MYRINGOTOMY AND TUBES  1960   TYMPANOSTOMY  1960   WISDOM TOOTH EXTRACTION  1978    Social History   Socioeconomic History   Marital status: Single    Spouse name: Not on file   Number of children: Not on file   Years of education: Not on file    Highest education level: Not on file  Occupational History   Occupation: Retired Biomedical scientist  Tobacco Use   Smoking status: Former    Packs/day: 1.00    Years: 10.00    Pack years: 10.00    Types: Cigarettes    Quit date: 12/29/1998    Years since quitting: 22.3   Smokeless tobacco: Never  Vaping Use   Vaping Use: Never used  Substance and Sexual Activity   Alcohol use: Yes    Alcohol/week: 1.0 standard drink    Types: 1 Cans of beer per week    Comment: occ   Drug use: No   Sexual activity: Never  Other Topics Concern   Not on file  Social History Narrative   Not on file   Social Determinants of Health   Financial Resource Strain: Not on file  Food Insecurity: Not on file  Transportation Needs: Not on file  Physical Activity: Not on file  Stress: Not on file  Social Connections: Not on file  Intimate Partner Violence: Not on file    Family History  Problem Relation Age of Onset   Diabetes Mother    Obesity Mother    Diabetes Father    Hypertension Father    Heart disease Father    Obesity Father    Colon polyps Neg Hx    Colon cancer Neg Hx    Esophageal cancer Neg Hx    Stomach cancer Neg Hx    Rectal cancer Neg Hx      Review of Systems  Constitutional: Negative.  Negative for chills and fever.  HENT: Negative.  Negative for congestion and sore throat.   Respiratory: Negative.  Negative for cough and shortness of breath.   Cardiovascular: Negative.  Negative for chest pain and palpitations.  Gastrointestinal: Negative.  Negative for abdominal pain, diarrhea, nausea and vomiting.  Genitourinary: Negative.  Negative for dysuria and hematuria.  Musculoskeletal: Negative.   Skin: Negative.  Negative for rash.  Neurological: Negative.  Negative for dizziness and headaches.  All other systems reviewed and are negative.  Today's Vitals   04/22/21 1326  BP: 120/82  Pulse: 86  Temp: 97.9 F (36.6 C)  TempSrc: Oral  SpO2: 95%  Weight: (!) 334 lb (151.5 kg)   Height: 6' (1.829 m)   Body mass index is 45.3 kg/m. Wt Readings from Last 3 Encounters:  04/22/21 (!) 334 lb (151.5 kg)  01/18/21 (!) 334 lb (151.5 kg)  01/17/21 (!) 337 lb 3.2 oz (153 kg)    Physical Exam Vitals reviewed.  Constitutional:      Appearance: He is obese.  HENT:     Head: Normocephalic.     Mouth/Throat:     Mouth: Mucous membranes are moist.     Pharynx: Oropharynx is clear.  Eyes:     Extraocular Movements: Extraocular movements intact.     Pupils: Pupils are equal, round, and reactive to light.  Cardiovascular:  Rate and Rhythm: Normal rate and regular rhythm.     Pulses: Normal pulses.     Heart sounds: Normal heart sounds.  Pulmonary:     Effort: Pulmonary effort is normal.     Breath sounds: Normal breath sounds.  Musculoskeletal:     Cervical back: Normal range of motion and neck supple.  Skin:    General: Skin is warm and dry.     Capillary Refill: Capillary refill takes less than 2 seconds.  Neurological:     General: No focal deficit present.     Mental Status: He is alert and oriented to person, place, and time.  Psychiatric:        Mood and Affect: Mood normal.        Behavior: Behavior normal.     ASSESSMENT & PLAN: Obesity, diabetes, and hypertension syndrome ([place]) Same weight as before.  Diet and nutrition discussed.  Advised to decrease amount of daily carbohydrate intake. Well-controlled diabetes with hemoglobin A1c at 6.1.  Continue metformin 1000 mg twice a day and weekly Trulicity 1.5 mg. Well-controlled hypertension.  Continue amlodipine 10 mg daily and lisinopril 20 mg daily. Follow-up in 3 to 6 months.  Longstanding persistent atrial fibrillation (HCC) Stable rhythm and rate.  On Eliquis 5 mg twice a day.  No bleeding problems.  Mehdi was seen today for hypertension.  Diagnoses and all orders for this visit:  Obesity, diabetes, and hypertension syndrome ([place]) -     POCT glycosylated hemoglobin (Hb  A1C)  Longstanding persistent atrial fibrillation (H[facility])  Flu vaccine need -     Flu Vaccine QUAD High Dose(Fluad)  Patient Instructions  Diabetes Mellitus and Nutrition, Adult When you have diabetes, or diabetes mellitus, it is very important to have healthy eating habits because your blood sugar (glucose) levels are greatly affected by what you eat and drink. Eating healthy foods in the right amounts, at about the same times every day, can help you: Control your blood glucose. Lower your risk of heart disease. Improve your blood pressure. Reach or maintain a healthy weight. What can affect my meal plan? Every person with diabetes is different, and each person has different needs for a meal plan. Your health care provider may recommend that you work with a dietitian to make a meal plan that is best for you. Your meal plan may vary depending on factors such as: The calories you need. The medicines you take. Your weight. Your blood glucose, blood pressure, and cholesterol levels. Your activity level. Other health conditions you have, such as heart or kidney disease. How do carbohydrates affect me? Carbohydrates, also called carbs, affect your blood glucose level more than any other type of food. Eating carbs naturally raises the amount of glucose in your blood. Carb counting is a method for keeping track of how many carbs you eat. Counting carbs is important to keep your blood glucose at a healthy level, especially if you use insulin or take certain oral diabetes medicines. It is important to know how many carbs you can safely have in each meal. This is different for every person. Your dietitian can help you calculate how many carbs you should have at each meal and for each snack. How does alcohol affect me? Alcohol can cause a sudden decrease in blood glucose (hypoglycemia), especially if you use insulin or take certain oral diabetes medicines. Hypoglycemia can be a life-threatening  condition. Symptoms of hypoglycemia, such as sleepiness, dizziness, and confusion, are similar to symptoms of having  too much alcohol. Do not drink alcohol if: Your health care provider tells you not to drink. You are pregnant, may be pregnant, or are planning to become pregnant. If you drink alcohol: Do not drink on an empty stomach. Limit how much you use to: 0-1 drink a day for women. 0-2 drinks a day for men. Be aware of how much alcohol is in your drink. In the [place] one drink equals one 12 oz bottle of beer (355 mL), one 5 oz glass of wine (148 mL), or one 1 oz glass of hard liquor (44 mL). Keep yourself hydrated with water, diet soda, or unsweetened iced tea. Keep in mind that regular soda, juice, and other mixers may contain a lot of sugar and must be counted as carbs. What are tips for following this plan? Reading food labels Start by checking the serving size on the "Nutrition Facts" label of packaged foods and drinks. The amount of calories, carbs, fats, and other nutrients listed on the label is based on one serving of the item. Many items contain more than one serving per package. Check the total grams (g) of carbs in one serving. You can calculate the number of servings of carbs in one serving by dividing the total carbs by 15. For example, if a food has 30 g of total carbs per serving, it would be equal to 2 servings of carbs. Check the number of grams (g) of saturated fats and trans fats in one serving. Choose foods that have a low amount or none of these fats. Check the number of milligrams (mg) of salt (sodium) in one serving. Most people should limit total sodium intake to less than 2,300 mg per day. Always check the nutrition information of foods labeled as "low-fat" or "nonfat." These foods may be higher in added sugar or refined carbs and should be avoided. Talk to your dietitian to identify your daily goals for nutrients listed on the label. Shopping Avoid buying canned,  pre-made, or processed foods. These foods tend to be high in fat, sodium, and added sugar. Shop around the outside edge of the grocery store. This is where you will most often find fresh fruits and vegetables, bulk grains, fresh meats, and fresh dairy. Cooking Use low-heat cooking methods, such as baking, instead of high-heat cooking methods like deep frying. Cook using healthy oils, such as olive, canola, or sunflower oil. Avoid cooking with butter, cream, or high-fat meats. Meal planning Eat meals and snacks regularly, preferably at the same times every day. Avoid going long periods of time without eating. Eat foods that are high in fiber, such as fresh fruits, vegetables, beans, and whole grains. Talk with your dietitian about how many servings of carbs you can eat at each meal. Eat 4-6 oz (112-168 g) of lean protein each day, such as lean meat, chicken, fish, eggs, or tofu. One ounce (oz) of lean protein is equal to: 1 oz (28 g) of meat, chicken, or fish. 1 egg.  cup (62 g) of tofu. Eat some foods each day that contain healthy fats, such as avocado, nuts, seeds, and fish. What foods should I eat? Fruits Berries. Apples. Oranges. Peaches. Apricots. Plums. Grapes. Mango. Papaya. Pomegranate. Kiwi. Cherries. Vegetables Lettuce. Spinach. Leafy greens, including kale, chard, collard greens, and mustard greens. Beets. Cauliflower. Cabbage. Broccoli. Carrots. Green beans. Tomatoes. Peppers. Onions. Cucumbers. Brussels sprouts. Grains Whole grains, such as whole-wheat or whole-grain bread, crackers, tortillas, cereal, and pasta. Unsweetened oatmeal. Quinoa. Brown or wild rice. Meats  and other proteins Seafood. Poultry without skin. Lean cuts of poultry and beef. Tofu. Nuts. Seeds. Dairy Low-fat or fat-free dairy products such as milk, yogurt, and cheese. The items listed above may not be a complete list of foods and beverages you can eat. Contact a dietitian for more information. What foods  should I avoid? Fruits Fruits canned with syrup. Vegetables Canned vegetables. Frozen vegetables with butter or cream sauce. Grains Refined white flour and flour products such as bread, pasta, snack foods, and cereals. Avoid all processed foods. Meats and other proteins Fatty cuts of meat. Poultry with skin. Breaded or fried meats. Processed meat. Avoid saturated fats. Dairy Full-fat yogurt, cheese, or milk. Beverages Sweetened drinks, such as soda or iced tea. The items listed above may not be a complete list of foods and beverages you should avoid. Contact a dietitian for more information. Questions to ask a health care provider Do I need to meet with a diabetes educator? Do I need to meet with a dietitian? What number can I call if I have questions? When are the best times to check my blood glucose? Where to find more information: American Diabetes Association: diabetes.org Academy of Nutrition and Dietetics: www.eatright.[employer] of Diabetes and Digestive and Kidney Diseases: [place] Association of Diabetes Care and Education Specialists: www.diabeteseducator.org Summary It is important to have healthy eating habits because your blood sugar (glucose) levels are greatly affected by what you eat and drink. A healthy meal plan will help you control your blood glucose and maintain a healthy lifestyle. Your health care provider may recommend that you work with a dietitian to make a meal plan that is best for you. Keep in mind that carbohydrates (carbs) and alcohol have immediate effects on your blood glucose levels. It is important to count carbs and to use alcohol carefully. This information is not intended to replace advice given to you by your health care provider. Make sure you discuss any questions you have with your health care provider. Document Revised: 06/21/2019 Document Reviewed: 06/21/2019 Elsevier Patient Education  2021 [place], MD [facility]

## 2021-04-22 NOTE — Assessment & Plan Note (Signed)
Stable rhythm and rate.  On Eliquis 5 mg twice a day.  No bleeding problems.

## 2021-04-22 NOTE — Assessment & Plan Note (Signed)
Same weight as before.  Diet and nutrition discussed.  Advised to decrease amount of daily carbohydrate intake. Well-controlled diabetes with hemoglobin A1c at 6.1.  Continue metformin 1000 mg twice a day and weekly Trulicity 1.5 mg. Well-controlled hypertension.  Continue amlodipine 10 mg daily and lisinopril 20 mg daily. Follow-up in 3 to 6 months.

## 2021-05-31 ENCOUNTER — Encounter: Payer: Self-pay | Admitting: Cardiovascular Disease

## 2021-05-31 ENCOUNTER — Other Ambulatory Visit: Payer: Self-pay

## 2021-05-31 ENCOUNTER — Other Ambulatory Visit: Payer: Self-pay | Admitting: Cardiovascular Disease

## 2021-05-31 ENCOUNTER — Ambulatory Visit (INDEPENDENT_AMBULATORY_CARE_PROVIDER_SITE_OTHER): Payer: Medicare Other | Admitting: Cardiovascular Disease

## 2021-05-31 VITALS — BP 120/62 | HR 89 | Ht 72.0 in | Wt 329.0 lb

## 2021-05-31 DIAGNOSIS — I10 Essential (primary) hypertension: Secondary | ICD-10-CM

## 2021-05-31 DIAGNOSIS — E785 Hyperlipidemia, unspecified: Secondary | ICD-10-CM | POA: Diagnosis not present

## 2021-05-31 DIAGNOSIS — Z7901 Long term (current) use of anticoagulants: Secondary | ICD-10-CM | POA: Diagnosis not present

## 2021-05-31 DIAGNOSIS — G4733 Obstructive sleep apnea (adult) (pediatric): Secondary | ICD-10-CM

## 2021-05-31 DIAGNOSIS — I4891 Unspecified atrial fibrillation: Secondary | ICD-10-CM

## 2021-05-31 DIAGNOSIS — I4819 Other persistent atrial fibrillation: Secondary | ICD-10-CM

## 2021-05-31 DIAGNOSIS — E669 Obesity, unspecified: Secondary | ICD-10-CM

## 2021-05-31 DIAGNOSIS — E1169 Type 2 diabetes mellitus with other specified complication: Secondary | ICD-10-CM | POA: Diagnosis not present

## 2021-05-31 DIAGNOSIS — I452 Bifascicular block: Secondary | ICD-10-CM | POA: Diagnosis not present

## 2021-05-31 DIAGNOSIS — G4730 Sleep apnea, unspecified: Secondary | ICD-10-CM

## 2021-05-31 NOTE — Patient Instructions (Signed)

## 2021-05-31 NOTE — Progress Notes (Signed)
Cardiology Office Note:    Date:  06/02/2021   ID:  Darius Ingram, DOB 04-25-1956, MRN [id number]  PCP:  Horald Pollen, MD   [facility] Providers Cardiologist:  Sanda Klein, MD     Referring MD: Horald Pollen, *   Chief Complaint  Patient presents with   Atrial Fibrillation     History of Present Illness:    Darius Ingram is a 65 y.o. male with a hx of OSA, morbid obesity, hypertension, type 2 diabetes mellitus, hypercholesterolemia, mild left ventricular systolic dysfunction (Echo 11/14/2020 EF 45-50%), incidentally diagnosed with persistent atrial fibrillation in  March 2022, during a [facility] evaluation [facility].  He was spontaneously rate controlled, with ventricular rates in the 80s.  He underwent successful cardioversion on 01/09/2021 and has been maintaining sinus rhythm since then. He has been on anticoagulation with Eliquis since  11/27/2020, without bleeding complication.  He had a sleep study performed on 12/14/2020, which showed severe sleep apnea, but unfortunately he never had a scheduled CPAP titration study.  He thought he was just waiting for the CPAP equipment to be delivered, but the follow-up test needs to be performed first.  He has no cardiovascular complaints.  He continues to be able to walk a mile a day most days of the week without complaints of angina or dyspnea.  He has not had any palpitations even when he was in atrial fibrillation.  He denies syncope or dizziness, leg edema, orthopnea, PND.  He has not had any falls injuries or serious bleeding.  He denies intermittent claudication or any focal neurological events.  He continues to have significant daytime hypersomnolence which is not unexpected.  He has steadily been losing weight and is down 30 pounds from his peak weight of 360 pounds last year, but he still has morbid obesity with a BMI over 44.  Blood pressure is well controlled.  Past Medical  History:  Diagnosis Date   Allergy    seasonal allergies   Diabetes mellitus without complication ([place])    Hyperlipidemia    Hypertension    Hypothyroidism    Joint pain    SOBOE (shortness of breath on exertion)     Past Surgical History:  Procedure Laterality Date   CARDIOVERSION N/A 01/09/2021   Procedure: CARDIOVERSION;  Surgeon: [name], MD;  Location: [facility];  Service: Cardiovascular;  Laterality: N/A;   TONSILECTOMY, ADENOIDECTOMY, BILATERAL MYRINGOTOMY [place]    Current Medications: Current Meds  Medication Sig   acetaminophen (TYLENOL) 325 MG tablet Take 650 mg by mouth 2 (two) times daily.   amLODipine (NORVASC) 10 MG tablet Take 1 tablet (10 mg total) by mouth daily.   apixaban (ELIQUIS) 5 MG TABS tablet Take 1 tablet (5 mg total) by mouth 2 (two) times daily.   atorvastatin (LIPITOR) 40 MG tablet Take 1 tablet (40 mg total) by mouth daily.   calcium carbonate (TUMS EX) 750 MG chewable tablet Chew 750-1,500 mg by mouth daily as needed for heartburn.   cetirizine (ZYRTEC) 10 MG tablet Take 1 tablet (10 mg total) by mouth daily.   Dulaglutide (TRULICITY) 1.5 KD/3.2IZ SOPN Inject 1.5 mg into the skin once a week.   fluticasone (FLONASE) 50 MCG/ACT nasal spray Place 2 sprays into both nostrils daily.   lisinopril (ZESTRIL) 20 MG tablet Take 1 tablet (20 mg total) by mouth daily.   Multiple Vitamin (  MULTIVITAMIN PO) Take 1 tablet by mouth daily.     Allergies:   Penicillins   Social History   Socioeconomic History   Marital status: Single    Spouse name: Not on file   Number of children: Not on file   Years of education: Not on file   Highest education level: Not on file  Occupational History   Occupation: Retired Biomedical scientist  Tobacco Use   Smoking status: Former    Packs/day: 1.00    Years: 10.00    Pack years: 10.00    Types: Cigarettes    Quit date: 12/29/1998    Years since quitting:  22.4   Smokeless tobacco: Never  Vaping Use   Vaping Use: Never used  Substance and Sexual Activity   Alcohol use: Yes    Alcohol/week: 1.0 standard drink    Types: 1 Cans of beer per week    Comment: occ   Drug use: No   Sexual activity: Never  Other Topics Concern   Not on file  Social History Narrative   Not on file   Social Determinants of Health   Financial Resource Strain: Not on file  Food Insecurity: Not on file  Transportation Needs: Not on file  Physical Activity: Not on file  Stress: Not on file  Social Connections: Not on file     Family History: The patient's family history includes Diabetes in his father and mother; Heart disease in his father; Hypertension in his father; Obesity in his father and mother. There is no history of Colon polyps, Colon cancer, Esophageal cancer, Stomach cancer, or Rectal cancer.  ROS:   Please see the history of present illness.     All other systems reviewed and are negative.  EKGs/Labs/Other Studies Reviewed:    The following studies were reviewed today: Echocardiogram 11/14/2020  1. Abnormal septal motion inferior basal hypokinesis Abnormal GLS -13.3 .  Left ventricular ejection fraction, by estimation, is 45 to 50%. The left  ventricle has mildly decreased function. The left ventricle has no  regional wall motion abnormalities.  There is severe left ventricular hypertrophy. Left ventricular diastolic  parameters are indeterminate.   2. Right ventricular systolic function is normal. The right ventricular  size is normal.   3. Left atrial size was moderately dilated.   4. The mitral valve is normal in structure. Trivial mitral valve  regurgitation. No evidence of mitral stenosis.   5. The aortic valve is normal in structure. Aortic valve regurgitation is  not visualized. No aortic stenosis is present.   6. Aortic dilatation noted. There is mild dilatation of the ascending  aorta, measuring 38 mm.   7. The inferior vena  cava is normal in size with greater than 50%  respiratory variability, suggesting right atrial pressure of 3 mmHg.    EKG:  EKG is ordered today and personally reviewed.  It shows sinus rhythm with broad P wave suggesting left atrial enlargement, with pre-existing bifascicular block (RBBB plus LAFB).  The QRS measures 180 ms and the QTC is 515 ms.  Recent Labs: 01/03/2021: BUN 22; Creatinine, Ser 1.10; Hemoglobin 14.5; Platelets 277; Potassium 3.7; Sodium 143  Recent Lipid Panel    Component Value Date/Time   CHOL 152 10/02/2020 1039   TRIG 123 10/02/2020 1039   HDL 37 (L) 10/02/2020 1039   CHOLHDL 4.1 10/02/2020 1039   CHOLHDL 4.1 05/16/2013 1444   VLDL 15 05/16/2013 1444   LDLCALC 93 10/02/2020 1039     Risk Assessment/Calculations:  CHA2DS2-VASc Score = 3  This indicates a 3.2% annual risk of stroke. The patient's score is based upon: CHF History: 0 HTN History: 1 Diabetes History: 1 Stroke History: 0 Vascular Disease History: 0 Age Score: 1 Gender Score: 0      Physical Exam:    VS:  BP 120/62   Pulse 89   Ht 6' (1.829 m)   Wt (!) 329 lb (149.2 kg)   SpO2 98%   BMI 44.62 kg/m     Wt Readings from Last 3 Encounters:  05/31/21 (!) 329 lb (149.2 kg)  04/22/21 (!) 334 lb (151.5 kg)  01/18/21 (!) 334 lb (151.5 kg)      General: Alert, oriented x3, no distress, morbidly obese Head: no evidence of trauma, PERRL, EOMI, no exophtalmos or lid lag, no myxedema, no xanthelasma; normal ears, nose and oropharynx Neck: normal jugular venous pulsations and no hepatojugular reflux; brisk carotid pulses without delay and no carotid bruits Chest: clear to auscultation, no signs of consolidation by percussion or palpation, normal fremitus, symmetrical and full respiratory excursions Cardiovascular: normal position and quality of the apical impulse, regular rhythm, normal first and second heart sounds, no murmurs, rubs or gallops Abdomen: no tenderness or distention, no  masses by palpation, no abnormal pulsatility or arterial bruits, normal bowel sounds, no hepatosplenomegaly Extremities: no clubbing, cyanosis or edema; 2+ radial, ulnar and brachial pulses bilaterally; 2+ right femoral, posterior tibial and dorsalis pedis pulses; 2+ left femoral, posterior tibial and dorsalis pedis pulses; no subclavian or femoral bruits Neurological: grossly nonfocal Psych: Normal mood and affect  ASSESSMENT:    1. Persistent atrial fibrillation ([place])   2. Long term (current) use of anticoagulants   3. Bifascicular block   4. Obstructive sleep apnea syndrome   5. Morbid obesity ([place])   6. Essential hypertension   7. Dyslipidemia (high LDL; low HDL)   8. Diabetes mellitus type 2 in obese [facility])     PLAN:    In order of problems listed above:  AFib: He has maintained sinus rhythm for roughly 5 months following his cardioversion, despite not yet being on treatment for his sleep apnea.  He is compliant with anticoagulation.  Congratulated him on his weight loss efforts. Anticoagulation: No bleeding complications. Bifascicular block: Significant AV conduction abnormalities are also confirmed by the fact that he had spontaneous ventricular rate control without medications, while in atrial fibrillation.  We have discussed the fact that this means he might eventually require a dual-chamber permanent pacemaker. OSA: We will schedule him for the CPAP titration portion of his sleep study. Morbid obesity: Congratulated him on losing about 30 pounds in the last year.  He remains morbidly obese and should continue his efforts.  We have discussed the fact that atrial fibrillation recurrence is higher, the more he weighs. HTN: Well-controlled.  Avoid medications with negative chronotropic effect. HLP: He does not have known CAD or PAD.  Target LDL less than 100.  His LDL is acceptable at 93, but he has a low HDL that will only improve with additional weight loss.  Continue current statin  dose. DM: Excellent glycemic control with a hemoglobin A1c that has improved further to 6.1%.  He is on metformin and a GLP-1 agonist.    Medication Adjustments/Labs and Tests Ordered: Current medicines are reviewed at length with the patient today.  Concerns regarding medicines are outlined above.  Orders Placed This Encounter  Procedures   EKG 12-Lead    No orders of the defined  types were placed in this encounter.   Patient Instructions  Medication Instructions:  No changes *If you need a refill on your cardiac medications before your next appointment, please call your pharmacy*   Lab Work: None ordered If you have labs (blood work) drawn today and your tests are completely normal, you will receive your results only by: [place] (if you have MyChart) OR A paper copy in the mail If you have any lab test that is abnormal or we need to change your treatment, we will call you to review the results.   Testing/Procedures: None ordered   Follow-Up: At [facility], you and your health needs are our priority.  As part of our continuing mission to provide you with exceptional heart care, we have created designated Provider Care Teams.  These Care Teams include your primary Cardiologist (physician) and Advanced Practice Providers (APPs -  Physician Assistants and Nurse Practitioners) who all work together to provide you with the care you need, when you need it.  We recommend signing up for the patient portal called "MyChart".  Sign up information is provided on this After Visit Summary.  MyChart is used to connect with patients for Virtual Visits (Telemedicine).  Patients are able to view lab/test results, encounter notes, upcoming appointments, etc.  Non-urgent messages can be sent to your provider as well.   To learn more about what you can do with MyChart, go to [email or web address].    Your next appointment:   12 month(s)  The format for your next appointment:   In  Person  Provider:   Sanda Klein, MD   Signed, Sanda Klein, MD  06/02/2021 9:37 AM    [place]

## 2021-06-02 ENCOUNTER — Encounter: Payer: Self-pay | Admitting: Cardiovascular Disease

## 2021-06-12 ENCOUNTER — Telehealth: Payer: Self-pay | Admitting: *Deleted

## 2021-06-12 NOTE — Telephone Encounter (Signed)
Left CPAP titration appointment details on voice mail.

## 2021-06-12 NOTE — Telephone Encounter (Signed)
-----   Message from Sanda Klein, MD sent at 06/02/2021  9:39 AM EST ----- Did not get a chance to talk to you again.  Just a reminder that this patient needs to be scheduled for his CPAP titration.  Thank you for your help.

## 2021-07-10 DIAGNOSIS — E119 Type 2 diabetes mellitus without complications: Secondary | ICD-10-CM | POA: Diagnosis not present

## 2021-07-10 DIAGNOSIS — H2513 Age-related nuclear cataract, bilateral: Secondary | ICD-10-CM | POA: Diagnosis not present

## 2021-07-10 DIAGNOSIS — H43393 Other vitreous opacities, bilateral: Secondary | ICD-10-CM | POA: Diagnosis not present

## 2021-07-10 DIAGNOSIS — H40023 Open angle with borderline findings, high risk, bilateral: Secondary | ICD-10-CM | POA: Diagnosis not present

## 2021-07-16 ENCOUNTER — Encounter (HOSPITAL_BASED_OUTPATIENT_CLINIC_OR_DEPARTMENT_OTHER): Payer: Medicare Other | Admitting: Cardiovascular Disease

## 2021-07-24 ENCOUNTER — Encounter: Payer: Self-pay | Admitting: Emergency Medicine

## 2021-07-24 ENCOUNTER — Ambulatory Visit (INDEPENDENT_AMBULATORY_CARE_PROVIDER_SITE_OTHER): Payer: Medicare Other | Admitting: Emergency Medicine

## 2021-07-24 ENCOUNTER — Other Ambulatory Visit: Payer: Self-pay

## 2021-07-24 VITALS — BP 122/80 | HR 54 | Temp 98.1°F | Ht 72.0 in | Wt 330.0 lb

## 2021-07-24 DIAGNOSIS — I4891 Unspecified atrial fibrillation: Secondary | ICD-10-CM | POA: Diagnosis not present

## 2021-07-24 DIAGNOSIS — E669 Obesity, unspecified: Secondary | ICD-10-CM

## 2021-07-24 DIAGNOSIS — E1159 Type 2 diabetes mellitus with other circulatory complications: Secondary | ICD-10-CM | POA: Diagnosis not present

## 2021-07-24 DIAGNOSIS — I4811 Longstanding persistent atrial fibrillation: Secondary | ICD-10-CM | POA: Diagnosis not present

## 2021-07-24 DIAGNOSIS — Z23 Encounter for immunization: Secondary | ICD-10-CM | POA: Diagnosis not present

## 2021-07-24 DIAGNOSIS — I152 Hypertension secondary to endocrine disorders: Secondary | ICD-10-CM

## 2021-07-24 DIAGNOSIS — E785 Hyperlipidemia, unspecified: Secondary | ICD-10-CM

## 2021-07-24 DIAGNOSIS — E1169 Type 2 diabetes mellitus with other specified complication: Secondary | ICD-10-CM

## 2021-07-24 LAB — POCT GLYCOSYLATED HEMOGLOBIN (HGB A1C): Hemoglobin A1C: 6 % — AB (ref 4.0–5.6)

## 2021-07-24 NOTE — Progress Notes (Signed)
Darius Ingram 65 y.o.   Chief Complaint  Patient presents with   Hypertension   Diabetes    HISTORY OF PRESENT ILLNESS: This is a 65 y.o. male here for 65-month follow-up of diabetes and hypertension. Doing well.  Has no complaints or medical concerns today. Lab Results  Component Value Date   HGBA1C 6.1 (A) 04/22/2021   BP Readings from Last 3 Encounters:  07/24/21 122/80  05/31/21 120/62  04/22/21 120/82     Hypertension Pertinent negatives include no chest pain, headaches, palpitations or shortness of breath.  Diabetes Pertinent negatives for hypoglycemia include no dizziness or headaches. Pertinent negatives for diabetes include no chest pain.    Prior to Admission medications   Medication Sig Start Date End Date Taking? Authorizing Provider  acetaminophen (TYLENOL) 325 MG tablet Take 650 mg by mouth 2 (two) times daily.   Yes [provider]  amLODipine (NORVASC) 10 MG tablet Take 1 tablet (10 mg total) by mouth daily. 10/02/20  Yes Emiliano Welshans, Ines Bloomer, MD  apixaban (ELIQUIS) 5 MG TABS tablet Take 1 tablet (5 mg total) by mouth 2 (two) times daily. 03/14/21  Yes Fenton, Clint R, PA  atorvastatin (LIPITOR) 40 MG tablet Take 1 tablet (40 mg total) by mouth daily. 01/09/21  Yes [name], MD  calcium carbonate (TUMS EX) 750 MG chewable tablet Chew 750-1,500 mg by mouth daily as needed for heartburn.   Yes [provider]  cetirizine (ZYRTEC) 10 MG tablet Take 1 tablet (10 mg total) by mouth daily. 01/21/18  Yes Jaynee Eagles, PA-C  Dulaglutide (TRULICITY) 1.5 CN/4.7SJ SOPN Inject 1.5 mg into the skin once a week. 01/17/21  Yes Corde Antonini, Ines Bloomer, MD  fluticasone [facility]) 50 MCG/ACT nasal spray Place 2 sprays into both nostrils daily. 01/09/21  Yes [name], MD  lisinopril (ZESTRIL) 20 MG tablet Take 1 tablet (20 mg total) by mouth daily. 01/09/21  Yes [name], MD  metFORMIN (GLUCOPHAGE) 1000 MG tablet Take 1 tablet (1,000 mg  total) by mouth 2 (two) times daily with a meal. 10/02/20 07/24/21 Yes Puneet Masoner, Ines Bloomer, MD  Multiple Vitamin (MULTIVITAMIN PO) Take 1 tablet by mouth daily.   Yes [provider]    Allergies  Allergen Reactions   Penicillins Rash    Reaction: 50 years ago    Patient Active Problem List   Diagnosis Date Noted   Atrial fibrillation ([place]) 12/31/2020   Food insecurity 12/31/2020   Bifascicular block 12/25/2020   Dyslipidemia (high LDL; low HDL) 12/25/2020   Obesity, diabetes, and hypertension syndrome ([place]) 12/25/2020   Class 3 severe obesity with serious comorbidity and body mass index (BMI) of 45.0 to 49.9 in adult ([place]) 11/15/2020   Visceral obesity 10/29/2020   Longstanding persistent atrial fibrillation ([place]) 10/15/2020   Secondary hypercoagulable state ([place]) 10/15/2020   Diabetes mellitus ([place]) 01/24/2020   Nuclear sclerotic cataract of both eyes 01/24/2020   Uncontrolled type 2 diabetes mellitus with hyperglycemia ([place]) 12/29/2019   Hypertension associated with diabetes ([place]) 12/29/2019   Body mass index (BMI) of 45.0-49.9 in adult [facility]) 12/29/2019    Past Medical History:  Diagnosis Date   Allergy    seasonal allergies   Diabetes mellitus without complication ([place])    Hyperlipidemia    Hypertension    Hypothyroidism    Joint pain    SOBOE (shortness of breath on exertion)     Past Surgical History:  Procedure Laterality Date   CARDIOVERSION N/A 01/09/2021   Procedure: CARDIOVERSION;  Surgeon:  [name], MD;  Location: [facility] ENDOSCOPY;  Service: Cardiovascular;  Laterality: N/A;   TONSILECTOMY, ADENOIDECTOMY, BILATERAL MYRINGOTOMY AND [place] EXTRACTION  1978    Social History   Socioeconomic History   Marital status: Single    Spouse name: Not on file   Number of children: Not on file   Years of education: Not on file   Highest education level: Not on file  Occupational History   Occupation:  Retired Chef  Tobacco Use   Smoking status: Former    Packs/day: 1.00    Years: 10.00    Pack years: 10.00    Types: Cigarettes    Quit date: 12/29/1998    Years since quitting: 22.5   Smokeless tobacco: Never  Vaping Use   Vaping Use: Never used  Substance and Sexual Activity   Alcohol use: Yes    Alcohol/week: 1.0 standard drink    Types: 1 Cans of beer per week    Comment: occ   Drug use: No   Sexual activity: Never  Other Topics Concern   Not on file  Social History Narrative   Not on file   Social Determinants of Health   Financial Resource Strain: Not on file  Food Insecurity: Not on file  Transportation Needs: Not on file  Physical Activity: Not on file  Stress: Not on file  Social Connections: Not on file  Intimate Partner Violence: Not on file    Family History  Problem Relation Age of Onset   Diabetes Mother    Obesity Mother    Diabetes Father    Hypertension Father    Heart disease Father    Obesity Father    Colon polyps Neg Hx    Colon cancer Neg Hx    Esophageal cancer Neg Hx    Stomach cancer Neg Hx    Rectal cancer Neg Hx      Review of Systems  Constitutional: Negative.  Negative for chills and fever.  HENT: Negative.  Negative for congestion and sore throat.   Respiratory: Negative.  Negative for cough and shortness of breath.   Cardiovascular: Negative.  Negative for chest pain and palpitations.  Gastrointestinal: Negative.  Negative for abdominal pain, diarrhea, nausea and vomiting.  Genitourinary: Negative.  Negative for dysuria and hematuria.  Skin: Negative.  Negative for rash.  Neurological: Negative.  Negative for dizziness and headaches.  All other systems reviewed and are negative.  Today's Vitals   07/24/21 1314  BP: 122/80  Pulse: (!) 54  Temp: 98.1 F (36.7 C)  TempSrc: Oral  SpO2: 96%  Weight: (!) 330 lb (149.7 kg)  Height: 6' (1.829 m)   Body mass index is 44.76 kg/m. Wt Readings from Last 3 Encounters:   07/24/21 (!) 330 lb (149.7 kg)  05/31/21 (!) 329 lb (149.2 kg)  04/22/21 (!) 334 lb (151.5 kg)    Physical Exam Vitals reviewed.  Constitutional:      Appearance: Normal appearance. He is obese.  HENT:     Head: Normocephalic.  Eyes:     Extraocular Movements: Extraocular movements intact.     Pupils: Pupils are equal, round, and reactive to light.  Cardiovascular:     Rate and Rhythm: Normal rate. Rhythm irregular.     Heart sounds: Normal heart sounds.  Pulmonary:     Effort: Pulmonary effort is normal.     Breath sounds: Normal breath sounds.  Musculoskeletal:  Cervical back: No tenderness.  Lymphadenopathy:     Cervical: No cervical adenopathy.  Skin:    General: Skin is warm and dry.     Capillary Refill: Capillary refill takes less than 2 seconds.  Neurological:     General: No focal deficit present.     Mental Status: He is alert and oriented to person, place, and time.  Psychiatric:        Mood and Affect: Mood normal.        Behavior: Behavior normal.    Results for orders placed or performed in visit on 07/24/21 (from the past 24 hour(s))  POCT glycosylated hemoglobin (Hb A1C)     Status: Abnormal   Collection Time: 07/24/21  1:25 PM  Result Value Ref Range   Hemoglobin A1C 6.0 (A) 4.0 - 5.6 %   HbA1c POC (<> result, manual entry)     HbA1c, POC (prediabetic range)     HbA1c, POC (controlled diabetic range)      ASSESSMENT & PLAN: Problem List Items Addressed This Visit       Cardiovascular and Mediastinum   Longstanding persistent atrial fibrillation (HCC)    Stable.  Continues on Eliquis 5 mg twice a day. No bleeding problems.  No falls.      Obesity, diabetes, and hypertension syndrome ([place]) - Primary    No significant weight loss.  Diet and nutrition discussed. Well-controlled diabetes with hemoglobin A1c of 6.0.  Continue metformin 1000 mg twice a day and Trulicity 1.5 mg weekly. Well-controlled hypertension.  Continue amlodipine 10 mg and  lisinopril 10 mg daily. Follow-up in 6 months.      Relevant Orders   POCT glycosylated hemoglobin (Hb A1C) (Completed)   Atrial fibrillation (HCC)    Well-controlled rate.  Stable.        Other   Dyslipidemia (high LDL; low HDL)    Stable.  Continue atorvastatin 40 mg daily. The 10-year ASCVD risk score (Arnett DK, et al., 2019) is: 26%   Values used to calculate the score:     Age: 56 years     Sex: Male     Is Non-Hispanic African American: No     Diabetic: Yes     Tobacco smoker: No     Systolic Blood Pressure: 546 mmHg     Is BP treated: Yes     HDL Cholesterol: 37 mg/dL     Total Cholesterol: 152 mg/dL       Other Visit Diagnoses     Need for influenza vaccination       Relevant Orders   Flu Vaccine QUAD High Dose(Fluad) (Completed)      Patient Instructions  Diabetes Mellitus and Nutrition, Adult When you have diabetes, or diabetes mellitus, it is very important to have healthy eating habits because your blood sugar (glucose) levels are greatly affected by what you eat and drink. Eating healthy foods in the right amounts, at about the same times every day, can help you: Manage your blood glucose. Lower your risk of heart disease. Improve your blood pressure. Reach or maintain a healthy weight. What can affect my meal plan? Every person with diabetes is different, and each person has different needs for a meal plan. Your health care provider may recommend that you work with a dietitian to make a meal plan that is best for you. Your meal plan may vary depending on factors such as: The calories you need. The medicines you take. Your weight. Your blood glucose, blood  pressure, and cholesterol levels. Your activity level. Other health conditions you have, such as heart or kidney disease. How do carbohydrates affect me? Carbohydrates, also called carbs, affect your blood glucose level more than any other type of food. Eating carbs raises the amount of glucose in  your blood. It is important to know how many carbs you can safely have in each meal. This is different for every person. Your dietitian can help you calculate how many carbs you should have at each meal and for each snack. How does alcohol affect me? Alcohol can cause a decrease in blood glucose (hypoglycemia), especially if you use insulin or take certain diabetes medicines by mouth. Hypoglycemia can be a life-threatening condition. Symptoms of hypoglycemia, such as sleepiness, dizziness, and confusion, are similar to symptoms of having too much alcohol. Do not drink alcohol if: Your health care provider tells you not to drink. You are pregnant, may be pregnant, or are planning to become pregnant. If you drink alcohol: Limit how much you have to: 0-1 drink a day for women. 0-2 drinks a day for men. Know how much alcohol is in your drink. In the [place] one drink equals one 12 oz bottle of beer (355 mL), one 5 oz glass of wine (148 mL), or one 1 oz glass of hard liquor (44 mL). Keep yourself hydrated with water, diet soda, or unsweetened iced tea. Keep in mind that regular soda, juice, and other mixers may contain a lot of sugar and must be counted as carbs. What are tips for following this plan? Reading food labels Start by checking the serving size on the Nutrition Facts label of packaged foods and drinks. The number of calories and the amount of carbs, fats, and other nutrients listed on the label are based on one serving of the item. Many items contain more than one serving per package. Check the total grams (g) of carbs in one serving. Check the number of grams of saturated fats and trans fats in one serving. Choose foods that have a low amount or none of these fats. Check the number of milligrams (mg) of salt (sodium) in one serving. Most people should limit total sodium intake to less than 2,300 mg per day. Always check the nutrition information of foods labeled as "low-fat" or "nonfat."  These foods may be higher in added sugar or refined carbs and should be avoided. Talk to your dietitian to identify your daily goals for nutrients listed on the label. Shopping Avoid buying canned, pre-made, or processed foods. These foods tend to be high in fat, sodium, and added sugar. Shop around the outside edge of the grocery store. This is where you will most often find fresh fruits and vegetables, bulk grains, fresh meats, and fresh dairy products. Cooking Use low-heat cooking methods, such as baking, instead of high-heat cooking methods, such as deep frying. Cook using healthy oils, such as olive, canola, or sunflower oil. Avoid cooking with butter, cream, or high-fat meats. Meal planning Eat meals and snacks regularly, preferably at the same times every day. Avoid going long periods of time without eating. Eat foods that are high in fiber, such as fresh fruits, vegetables, beans, and whole grains. Eat 4-6 oz (112-168 g) of lean protein each day, such as lean meat, chicken, fish, eggs, or tofu. One ounce (oz) (28 g) of lean protein is equal to: 1 oz (28 g) of meat, chicken, or fish. 1 egg.  cup (62 g) of tofu. Eat some foods each  day that contain healthy fats, such as avocado, nuts, seeds, and fish. What foods should I eat? Fruits Berries. Apples. Oranges. Peaches. Apricots. Plums. Grapes. Mangoes. Papayas. Pomegranates. Kiwi. Cherries. Vegetables Leafy greens, including lettuce, spinach, kale, chard, collard greens, mustard greens, and cabbage. Beets. Cauliflower. Broccoli. Carrots. Green beans. Tomatoes. Peppers. Onions. Cucumbers. Brussels sprouts. Grains Whole grains, such as whole-wheat or whole-grain bread, crackers, tortillas, cereal, and pasta. Unsweetened oatmeal. Quinoa. Brown or wild rice. Meats and other proteins Seafood. Poultry without skin. Lean cuts of poultry and beef. Tofu. Nuts. Seeds. Dairy Low-fat or fat-free dairy products such as milk, yogurt, and  cheese. The items listed above may not be a complete list of foods and beverages you can eat and drink. Contact a dietitian for more information. What foods should I avoid? Fruits Fruits canned with syrup. Vegetables Canned vegetables. Frozen vegetables with butter or cream sauce. Grains Refined white flour and flour products such as bread, pasta, snack foods, and cereals. Avoid all processed foods. Meats and other proteins Fatty cuts of meat. Poultry with skin. Breaded or fried meats. Processed meat. Avoid saturated fats. Dairy Full-fat yogurt, cheese, or milk. Beverages Sweetened drinks, such as soda or iced tea. The items listed above may not be a complete list of foods and beverages you should avoid. Contact a dietitian for more information. Questions to ask a health care provider Do I need to meet with a certified diabetes care and education specialist? Do I need to meet with a dietitian? What number can I call if I have questions? When are the best times to check my blood glucose? Where to find more information: American Diabetes Association: diabetes.org Academy of Nutrition and Dietetics: eatright.[employer] of Diabetes and Digestive and Kidney Diseases: [employer] Association of Diabetes Care & Education Specialists: diabeteseducator.org Summary It is important to have healthy eating habits because your blood sugar (glucose) levels are greatly affected by what you eat and drink. It is important to use alcohol carefully. A healthy meal plan will help you manage your blood glucose and lower your risk of heart disease. Your health care provider may recommend that you work with a dietitian to make a meal plan that is best for you. This information is not intended to replace advice given to you by your health care provider. Make sure you discuss any questions you have with your health care provider. Document Revised: 02/15/2020 Document Reviewed: 02/15/2020 Elsevier  Patient Education  2022 [place], [place] [facility]

## 2021-07-24 NOTE — Assessment & Plan Note (Signed)
No significant weight loss.  Diet and nutrition discussed. Well-controlled diabetes with hemoglobin A1c of 6.0.  Continue metformin 1000 mg twice a day and Trulicity 1.5 mg weekly. Well-controlled hypertension.  Continue amlodipine 10 mg and lisinopril 10 mg daily. Follow-up in 6 months.

## 2021-07-24 NOTE — Patient Instructions (Signed)

## 2021-07-24 NOTE — Assessment & Plan Note (Signed)
Stable.  Continue atorvastatin 40 mg daily. The 10-year ASCVD risk score (Arnett DK, et al., 2019) is: 26%   Values used to calculate the score:     Age: 65 years     Sex: Male     Is Non-Hispanic African American: No     Diabetic: Yes     Tobacco smoker: No     Systolic Blood Pressure: 301 mmHg     Is BP treated: Yes     HDL Cholesterol: 37 mg/dL     Total Cholesterol: 152 mg/dL

## 2021-07-24 NOTE — Assessment & Plan Note (Signed)
Well-controlled rate.  Stable.

## 2021-07-24 NOTE — Assessment & Plan Note (Signed)
Stable.  Continues on Eliquis 5 mg twice a day. No bleeding problems.  No falls.

## 2021-07-25 ENCOUNTER — Encounter (HOSPITAL_COMMUNITY): Payer: Self-pay | Admitting: Emergency Medicine

## 2021-07-25 ENCOUNTER — Inpatient Hospital Stay (HOSPITAL_COMMUNITY)
Admission: EM | Admit: 2021-07-25 | Discharge: 2021-07-31 | DRG: 061 | Disposition: A | Discharge status: Skilled Nursing Facility | Payer: Medicare Other | Attending: Internal Medicine | Admitting: Internal Medicine

## 2021-07-25 ENCOUNTER — Emergency Department (HOSPITAL_COMMUNITY): Payer: Medicare Other

## 2021-07-25 ENCOUNTER — Other Ambulatory Visit: Payer: Self-pay

## 2021-07-25 ENCOUNTER — Inpatient Hospital Stay (HOSPITAL_COMMUNITY): Payer: Medicare Other

## 2021-07-25 DIAGNOSIS — R471 Dysarthria and anarthria: Secondary | ICD-10-CM | POA: Diagnosis not present

## 2021-07-25 DIAGNOSIS — I611 Nontraumatic intracerebral hemorrhage in hemisphere, cortical: Secondary | ICD-10-CM | POA: Diagnosis not present

## 2021-07-25 DIAGNOSIS — I152 Hypertension secondary to endocrine disorders: Secondary | ICD-10-CM | POA: Diagnosis present

## 2021-07-25 DIAGNOSIS — G459 Transient cerebral ischemic attack, unspecified: Secondary | ICD-10-CM | POA: Diagnosis not present

## 2021-07-25 DIAGNOSIS — Z7901 Long term (current) use of anticoagulants: Secondary | ICD-10-CM

## 2021-07-25 DIAGNOSIS — I6389 Other cerebral infarction: Secondary | ICD-10-CM

## 2021-07-25 DIAGNOSIS — I6340 Cerebral infarction due to embolism of unspecified cerebral artery: Principal | ICD-10-CM | POA: Diagnosis present

## 2021-07-25 DIAGNOSIS — E039 Hypothyroidism, unspecified: Secondary | ICD-10-CM | POA: Diagnosis present

## 2021-07-25 DIAGNOSIS — J302 Other seasonal allergic rhinitis: Secondary | ICD-10-CM | POA: Diagnosis not present

## 2021-07-25 DIAGNOSIS — I4819 Other persistent atrial fibrillation: Secondary | ICD-10-CM | POA: Diagnosis not present

## 2021-07-25 DIAGNOSIS — R6889 Other general symptoms and signs: Secondary | ICD-10-CM | POA: Diagnosis not present

## 2021-07-25 DIAGNOSIS — I629 Nontraumatic intracranial hemorrhage, unspecified: Secondary | ICD-10-CM

## 2021-07-25 DIAGNOSIS — R29711 NIHSS score 11: Secondary | ICD-10-CM | POA: Diagnosis present

## 2021-07-25 DIAGNOSIS — Z713 Dietary counseling and surveillance: Secondary | ICD-10-CM

## 2021-07-25 DIAGNOSIS — Z20822 Contact with and (suspected) exposure to covid-19: Secondary | ICD-10-CM | POA: Diagnosis present

## 2021-07-25 DIAGNOSIS — R262 Difficulty in walking, not elsewhere classified: Secondary | ICD-10-CM | POA: Diagnosis not present

## 2021-07-25 DIAGNOSIS — Z743 Need for continuous supervision: Secondary | ICD-10-CM | POA: Diagnosis not present

## 2021-07-25 DIAGNOSIS — Z79899 Other long term (current) drug therapy: Secondary | ICD-10-CM

## 2021-07-25 DIAGNOSIS — L89151 Pressure ulcer of sacral region, stage 1: Secondary | ICD-10-CM | POA: Diagnosis present

## 2021-07-25 DIAGNOSIS — E785 Hyperlipidemia, unspecified: Secondary | ICD-10-CM | POA: Diagnosis present

## 2021-07-25 DIAGNOSIS — Z6841 Body Mass Index (BMI) 40.0 and over, adult: Secondary | ICD-10-CM

## 2021-07-25 DIAGNOSIS — I452 Bifascicular block: Secondary | ICD-10-CM | POA: Diagnosis present

## 2021-07-25 DIAGNOSIS — I4811 Longstanding persistent atrial fibrillation: Secondary | ICD-10-CM | POA: Diagnosis present

## 2021-07-25 DIAGNOSIS — Z880 Allergy status to penicillin: Secondary | ICD-10-CM

## 2021-07-25 DIAGNOSIS — Z833 Family history of diabetes mellitus: Secondary | ICD-10-CM

## 2021-07-25 DIAGNOSIS — I63412 Cerebral infarction due to embolism of left middle cerebral artery: Secondary | ICD-10-CM | POA: Diagnosis not present

## 2021-07-25 DIAGNOSIS — I6611 Occlusion and stenosis of right anterior cerebral artery: Secondary | ICD-10-CM | POA: Diagnosis not present

## 2021-07-25 DIAGNOSIS — Z7984 Long term (current) use of oral hypoglycemic drugs: Secondary | ICD-10-CM

## 2021-07-25 DIAGNOSIS — G936 Cerebral edema: Secondary | ICD-10-CM | POA: Diagnosis not present

## 2021-07-25 DIAGNOSIS — W19XXXA Unspecified fall, initial encounter: Secondary | ICD-10-CM | POA: Diagnosis present

## 2021-07-25 DIAGNOSIS — R2990 Unspecified symptoms and signs involving the nervous system: Secondary | ICD-10-CM

## 2021-07-25 DIAGNOSIS — I4891 Unspecified atrial fibrillation: Secondary | ICD-10-CM | POA: Diagnosis present

## 2021-07-25 DIAGNOSIS — J9811 Atelectasis: Secondary | ICD-10-CM | POA: Diagnosis not present

## 2021-07-25 DIAGNOSIS — E1165 Type 2 diabetes mellitus with hyperglycemia: Secondary | ICD-10-CM | POA: Diagnosis present

## 2021-07-25 DIAGNOSIS — I619 Nontraumatic intracerebral hemorrhage, unspecified: Secondary | ICD-10-CM | POA: Diagnosis present

## 2021-07-25 DIAGNOSIS — I6621 Occlusion and stenosis of right posterior cerebral artery: Secondary | ICD-10-CM | POA: Diagnosis not present

## 2021-07-25 DIAGNOSIS — R2681 Unsteadiness on feet: Secondary | ICD-10-CM | POA: Diagnosis not present

## 2021-07-25 DIAGNOSIS — Z87891 Personal history of nicotine dependence: Secondary | ICD-10-CM

## 2021-07-25 DIAGNOSIS — R278 Other lack of coordination: Secondary | ICD-10-CM | POA: Diagnosis not present

## 2021-07-25 DIAGNOSIS — Z7985 Long-term (current) use of injectable non-insulin antidiabetic drugs: Secondary | ICD-10-CM

## 2021-07-25 DIAGNOSIS — E1159 Type 2 diabetes mellitus with other circulatory complications: Secondary | ICD-10-CM | POA: Diagnosis present

## 2021-07-25 DIAGNOSIS — R29810 Facial weakness: Secondary | ICD-10-CM | POA: Diagnosis present

## 2021-07-25 DIAGNOSIS — M6281 Muscle weakness (generalized): Secondary | ICD-10-CM | POA: Diagnosis not present

## 2021-07-25 DIAGNOSIS — Z8249 Family history of ischemic heart disease and other diseases of the circulatory system: Secondary | ICD-10-CM

## 2021-07-25 DIAGNOSIS — I161 Hypertensive emergency: Secondary | ICD-10-CM | POA: Diagnosis not present

## 2021-07-25 DIAGNOSIS — L8990 Pressure ulcer of unspecified site, unspecified stage: Secondary | ICD-10-CM | POA: Insufficient documentation

## 2021-07-25 DIAGNOSIS — I499 Cardiac arrhythmia, unspecified: Secondary | ICD-10-CM | POA: Diagnosis not present

## 2021-07-25 DIAGNOSIS — E1169 Type 2 diabetes mellitus with other specified complication: Secondary | ICD-10-CM | POA: Diagnosis not present

## 2021-07-25 DIAGNOSIS — I10 Essential (primary) hypertension: Secondary | ICD-10-CM | POA: Diagnosis not present

## 2021-07-25 DIAGNOSIS — I672 Cerebral atherosclerosis: Secondary | ICD-10-CM | POA: Diagnosis not present

## 2021-07-25 DIAGNOSIS — G8191 Hemiplegia, unspecified affecting right dominant side: Secondary | ICD-10-CM | POA: Diagnosis present

## 2021-07-25 DIAGNOSIS — I639 Cerebral infarction, unspecified: Secondary | ICD-10-CM | POA: Diagnosis not present

## 2021-07-25 DIAGNOSIS — I517 Cardiomegaly: Secondary | ICD-10-CM | POA: Diagnosis not present

## 2021-07-25 DIAGNOSIS — R531 Weakness: Secondary | ICD-10-CM | POA: Diagnosis not present

## 2021-07-25 DIAGNOSIS — R4781 Slurred speech: Secondary | ICD-10-CM | POA: Diagnosis not present

## 2021-07-25 HISTORY — DX: Unspecified atrial fibrillation: I48.91

## 2021-07-25 LAB — URINALYSIS, ROUTINE W REFLEX MICROSCOPIC
Bilirubin Urine: NEGATIVE
Glucose, UA: NEGATIVE mg/dL
Ketones, ur: NEGATIVE mg/dL
Leukocytes,Ua: NEGATIVE
Nitrite: NEGATIVE
Protein, ur: NEGATIVE mg/dL
Specific Gravity, Urine: 1.015 (ref 1.005–1.030)
pH: 5.5 (ref 5.0–8.0)

## 2021-07-25 LAB — TYPE AND SCREEN
ABO/RH(D): A POS
Antibody Screen: NEGATIVE

## 2021-07-25 LAB — RESP PANEL BY RT-PCR (FLU A&B, COVID) ARPGX2
Influenza A by PCR: NEGATIVE
Influenza B by PCR: NEGATIVE
SARS Coronavirus 2 by RT PCR: NEGATIVE

## 2021-07-25 LAB — CBG MONITORING, ED: Glucose-Capillary: 175 mg/dL — ABNORMAL HIGH (ref 70–99)

## 2021-07-25 LAB — URINALYSIS, MICROSCOPIC (REFLEX): Squamous Epithelial / HPF: NONE SEEN (ref 0–5)

## 2021-07-25 LAB — COMPREHENSIVE METABOLIC PANEL
ALT: 23 U/L (ref 0–44)
AST: 24 U/L (ref 15–41)
Albumin: 3.7 g/dL (ref 3.5–5.0)
Alkaline Phosphatase: 76 U/L (ref 38–126)
Anion gap: 12 (ref 5–15)
BUN: 16 mg/dL (ref 8–23)
CO2: 22 mmol/L (ref 22–32)
Calcium: 8.3 mg/dL — ABNORMAL LOW (ref 8.9–10.3)
Chloride: 104 mmol/L (ref 98–111)
Creatinine, Ser: 1.14 mg/dL (ref 0.61–1.24)
GFR, Estimated: >60 mL/min (ref >60)
Glucose, Bld: 176 mg/dL — ABNORMAL HIGH (ref 70–99)
Potassium: 3.1 mmol/L — ABNORMAL LOW (ref 3.5–5.1)
Sodium: 138 mmol/L (ref 135–145)
Total Bilirubin: 0.7 mg/dL (ref 0.3–1.2)
Total Protein: 6.8 g/dL (ref 6.5–8.1)

## 2021-07-25 LAB — PHOSPHORUS: Phosphorus: 2.8 mg/dL (ref 2.5–4.6)

## 2021-07-25 LAB — I-STAT CHEM 8, ED
BUN: 17 mg/dL (ref 8–23)
Calcium, Ion: 1.05 mmol/L — ABNORMAL LOW (ref 1.15–1.40)
Chloride: 105 mmol/L (ref 98–111)
Creatinine, Ser: 0.9 mg/dL (ref 0.61–1.24)
Glucose, Bld: 173 mg/dL — ABNORMAL HIGH (ref 70–99)
HCT: 44 % (ref 39.0–52.0)
Hemoglobin: 15 g/dL (ref 13.0–17.0)
Potassium: 3.2 mmol/L — ABNORMAL LOW (ref 3.5–5.1)
Sodium: 143 mmol/L (ref 135–145)
TCO2: 25 mmol/L (ref 22–32)

## 2021-07-25 LAB — HEMOGLOBIN A1C
Hgb A1c MFr Bld: 6.3 % — ABNORMAL HIGH (ref 4.8–5.6)
Mean Plasma Glucose: 134.11 mg/dL

## 2021-07-25 LAB — DIFFERENTIAL
Abs Immature Granulocytes: 0.06 10*3/uL (ref 0.00–0.07)
Basophils Absolute: 0.1 10*3/uL (ref 0.0–0.1)
Basophils Relative: 1 %
Eosinophils Absolute: 0.4 10*3/uL (ref 0.0–0.5)
Eosinophils Relative: 3 %
Immature Granulocytes: 1 %
Lymphocytes Relative: 21 %
Lymphs Abs: 2.5 10*3/uL (ref 0.7–4.0)
Monocytes Absolute: 0.6 10*3/uL (ref 0.1–1.0)
Monocytes Relative: 5 %
Neutro Abs: 8.6 10*3/uL — ABNORMAL HIGH (ref 1.7–7.7)
Neutrophils Relative %: 69 %

## 2021-07-25 LAB — MAGNESIUM: Magnesium: 1.3 mg/dL — ABNORMAL LOW (ref 1.7–2.4)

## 2021-07-25 LAB — CBC
HCT: 43.4 % (ref 39.0–52.0)
Hemoglobin: 15 g/dL (ref 13.0–17.0)
MCH: 30.3 pg (ref 26.0–34.0)
MCHC: 34.6 g/dL (ref 30.0–36.0)
MCV: 87.7 fL (ref 80.0–100.0)
Platelets: 235 10*3/uL (ref 150–400)
RBC: 4.95 MIL/uL (ref 4.22–5.81)
RDW: 14.4 % (ref 11.5–15.5)
WBC: 12.2 10*3/uL — ABNORMAL HIGH (ref 4.0–10.5)
nRBC: 0 % (ref 0.0–0.2)

## 2021-07-25 LAB — HIV ANTIBODY (ROUTINE TESTING W REFLEX): HIV Screen 4th Generation wRfx: NONREACTIVE

## 2021-07-25 LAB — ABO/RH: ABO/RH(D): A POS

## 2021-07-25 LAB — LIPID PANEL
Cholesterol: 123 mg/dL (ref 0–200)
HDL: 29 mg/dL — ABNORMAL LOW (ref >40)
LDL Cholesterol: 75 mg/dL (ref 0–99)
Total CHOL/HDL Ratio: 4.2 RATIO
Triglycerides: 96 mg/dL (ref <150)
VLDL: 19 mg/dL (ref 0–40)

## 2021-07-25 LAB — ECHOCARDIOGRAM COMPLETE
AR max vel: 2.79 cm2
AV Area VTI: 2.87 cm2
AV Area mean vel: 2.73 cm2
AV Mean grad: 6 mmHg
AV Peak grad: 11.4 mmHg
Ao pk vel: 1.69 m/s
Height: 72 in
Weight: 5280 oz

## 2021-07-25 LAB — APTT: aPTT: 29 seconds (ref 24–36)

## 2021-07-25 LAB — PROTIME-INR
INR: 1.1 (ref 0.8–1.2)
Prothrombin Time: 14.5 seconds (ref 11.4–15.2)

## 2021-07-25 LAB — ETHANOL: Alcohol, Ethyl (B): <10 mg/dL (ref <10)

## 2021-07-25 LAB — GLUCOSE, CAPILLARY: Glucose-Capillary: 115 mg/dL — ABNORMAL HIGH (ref 70–99)

## 2021-07-25 MED ORDER — ACETAMINOPHEN 325 MG PO TABS: 650.0000 mg | ORAL_TABLET | ORAL | Status: DC | PRN

## 2021-07-25 MED ORDER — ORAL CARE MOUTH RINSE
15.0000 mL | Freq: Two times a day (BID) | OROMUCOSAL | Status: DC
  Administered 2021-07-26 – 2021-07-30 (×9): 15 mL via OROMUCOSAL

## 2021-07-25 MED ORDER — ACETAMINOPHEN 160 MG/5ML PO SOLN: 650.0000 mg | ORAL | Status: DC | PRN

## 2021-07-25 MED ORDER — SENNOSIDES-DOCUSATE SODIUM 8.6-50 MG PO TABS
1.0000 | ORAL_TABLET | Freq: Two times a day (BID) | ORAL | Status: DC
  Administered 2021-07-25 – 2021-07-31 (×11): 1 via ORAL
  Filled 2021-07-25 (×12): qty 1

## 2021-07-25 MED ORDER — EMPTY CONTAINERS FLEXIBLE MISC
900.0000 mg | Freq: Once | Status: AC
  Administered 2021-07-25: 10:00:00 900 mg via INTRAVENOUS
  Filled 2021-07-25: qty 90

## 2021-07-25 MED ORDER — SODIUM CHLORIDE 0.9% FLUSH
3.0000 mL | Freq: Once | INTRAVENOUS | Status: AC
Start: 2021-07-25 — End: 2021-07-25
  Administered 2021-07-25: 10:00:00 3 mL via INTRAVENOUS

## 2021-07-25 MED ORDER — LABETALOL HCL 5 MG/ML IV SOLN
INTRAVENOUS | Status: AC
  Administered 2021-07-25: 10:00:00 10 mg
  Filled 2021-07-25: qty 4

## 2021-07-25 MED ORDER — ACETAMINOPHEN-CODEINE #3 300-30 MG PO TABS: 1.0000 | ORAL_TABLET | ORAL | Status: DC | PRN

## 2021-07-25 MED ORDER — IOHEXOL 350 MG/ML SOLN
75.0000 mL | Freq: Once | INTRAVENOUS | Status: AC | PRN
  Administered 2021-07-25: 10:00:00 75 mL via INTRAVENOUS

## 2021-07-25 MED ORDER — CLEVIDIPINE BUTYRATE 0.5 MG/ML IV EMUL
INTRAVENOUS | Status: AC
  Administered 2021-07-25: 10:00:00 2 mg/h
  Filled 2021-07-25: qty 50

## 2021-07-25 MED ORDER — ACETAMINOPHEN 650 MG RE SUPP: 650.0000 mg | RECTAL | Status: DC | PRN

## 2021-07-25 MED ORDER — CHLORHEXIDINE GLUCONATE CLOTH 2 % EX PADS
6.0000 | MEDICATED_PAD | Freq: Every day | CUTANEOUS | Status: DC
  Administered 2021-07-26 – 2021-07-27 (×2): 6 via TOPICAL

## 2021-07-25 MED ORDER — PANTOPRAZOLE SODIUM 40 MG IV SOLR
40.0000 mg | Freq: Every day | INTRAVENOUS | Status: DC
  Administered 2021-07-25: 22:00:00 40 mg via INTRAVENOUS
  Filled 2021-07-25: qty 40

## 2021-07-25 MED ORDER — STROKE: EARLY STAGES OF RECOVERY BOOK
Freq: Once | Status: AC
  Filled 2021-07-25: qty 1

## 2021-07-25 MED ORDER — CLEVIDIPINE BUTYRATE 0.5 MG/ML IV EMUL
0.0000 mg/h | INTRAVENOUS | Status: DC
  Administered 2021-07-25: 23:00:00 4 mg/h via INTRAVENOUS
  Administered 2021-07-25: 14:00:00 16 mg/h via INTRAVENOUS
  Administered 2021-07-26: 16:00:00 4 mg/h via INTRAVENOUS
  Administered 2021-07-26: 04:00:00 6 mg/h via INTRAVENOUS
  Administered 2021-07-26: 08:00:00 5 mg/h via INTRAVENOUS
  Filled 2021-07-25 (×2): qty 50
  Filled 2021-07-25: qty 100
  Filled 2021-07-25 (×4): qty 50

## 2021-07-25 MED ORDER — MAGNESIUM SULFATE 4 GM/100ML IV SOLN
4.0000 g | Freq: Once | INTRAVENOUS | Status: AC
  Administered 2021-07-25: 4 g via INTRAVENOUS
  Filled 2021-07-25: qty 100

## 2021-07-25 NOTE — Code Documentation (Addendum)
Pt is a 65 yr old male with a history of HTN, DM, and AF on eliquis. He was last known well this morning at 0715when he ambulated to [facility] and spoke with roommate. After this time, He was unable to move rt side of body. EMS was called, and code stroke was activated in the field. Pt arrived at [facility]. Airway cleared, labs, CBG obtained at the bridge. Pt taken to CT at 0908. 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 (NIHSS 9). Please see timeline for additional details and times. Per Dr Theda Sers, CT positive for Left ICH. CTA obtained. Per Dr. Theda Sers, CTA negative for active, continuing bleed. Pt returned to [facility] room. Reversal of Eliquis with Andexxa initiated at 0940. Labetelol 10 mg given at 0942. Cleviprex gtt started at 0947. Pt will need continued close monitoring of BP and q 1 hr mNIHSS with pupil checks. Goal BP between 130-150.  Bedside handoff with Production assistant, radio.

## 2021-07-25 NOTE — Plan of Care (Signed)
  Problem: Education: Goal: Knowledge of disease or condition will improve Outcome: Progressing Goal: Knowledge of secondary prevention will improve (SELECT ALL) Outcome: Progressing Goal: Knowledge of patient specific risk factors will improve (INDIVIDUALIZE FOR PATIENT) Outcome: Progressing Goal: Individualized Educational Video(s) Outcome: Progressing   

## 2021-07-25 NOTE — ED Provider Notes (Incomplete Revision)
[facility] EMERGENCY DEPARTMENT Provider Note   CSN: [id number] Arrival date & time: 07/25/21  5366  An emergency department physician performed an initial assessment on this suspected stroke patient at 0906.  History Chief Complaint  Patient presents with   Code Stroke    Darius Ingram is a 65 y.o. male.  Patient is a 65 yo male with pmh of HTN, hyperlipidemia, DM, obesity, afib on eliquis presenting for right sided upper and lower extremity weakness that was discovered at 8am upon waking. Pt states he awoke in the middle of the night at 2AM and was up from 2AM-3AM with no symptoms prior to going back to sleep. No prior hx of CVA.   The history is provided by the patient. No language interpreter was used.      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   Atrial fibrillation ([place]) 12/31/2020   Bifascicular block 12/25/2020   Dyslipidemia (high LDL; low HDL) 12/25/2020   Obesity, diabetes, and hypertension syndrome ([place]) 12/25/2020   Class 3 severe obesity with serious comorbidity and body mass index (BMI) of 45.0 to 49.9 in adult ([place]) 11/15/2020   Visceral obesity 10/29/2020   Longstanding persistent atrial fibrillation ([place]) 10/15/2020   Secondary hypercoagulable state ([place]) 10/15/2020   Diabetes mellitus ([place]) 01/24/2020   Nuclear sclerotic cataract of both eyes 01/24/2020   Uncontrolled type 2 diabetes mellitus with hyperglycemia ([place]) 12/29/2019   Hypertension associated with diabetes ([place]) 12/29/2019   Body mass index (BMI) of 45.0-49.9 in adult [facility]) 12/29/2019    Past Surgical History:  Procedure Laterality Date   CARDIOVERSION N/A 01/09/2021   Procedure:  CARDIOVERSION;  Surgeon: [name], MD;  Location: [facility] ENDOSCOPY;  Service: Cardiovascular;  Laterality: N/A;   TONSILECTOMY, ADENOIDECTOMY, BILATERAL [place]       Family History  Problem Relation Age of Onset   Diabetes Mother    Obesity Mother    Diabetes Father    Hypertension Father    Heart disease Father    Obesity Father    Colon polyps Neg Hx    Colon cancer Neg Hx    Esophageal cancer Neg Hx    Stomach cancer Neg Hx    Rectal cancer Neg Hx     Social History   Tobacco Use   Smoking status: Former    Packs/day: 1.00    Years: 10.00    Pack years: 10.00    Types: Cigarettes    Quit date: 12/29/1998    Years since quitting: 22.5   Smokeless tobacco: Never  Vaping Use   Vaping Use: Never used  Substance Use Topics   Alcohol use: Yes    Alcohol/week: 1.0 standard drink    Types: 1 Cans of beer per week    Comment: occ   Drug use: No    Home Medications Prior to Admission medications   Medication Sig Start Date End Date Taking? Authorizing Provider  acetaminophen (TYLENOL) 325 MG tablet Take 650 mg by mouth 2 (two) times daily.    [provider]  amLODipine (  NORVASC) 10 MG tablet Take 1 tablet (10 mg total) by mouth daily. 10/02/20   Horald Pollen, MD  apixaban (ELIQUIS) 5 MG TABS tablet Take 1 tablet (5 mg total) by mouth 2 (two) times daily. 03/14/21   Fenton, Clint R, PA  atorvastatin (LIPITOR) 40 MG tablet Take 1 tablet (40 mg total) by mouth daily. 01/09/21   [name], MD  calcium carbonate (TUMS EX) 750 MG chewable tablet Chew 750-1,500 mg by mouth daily as needed for heartburn.    [provider]  cetirizine (ZYRTEC) 10 MG tablet Take 1 tablet (10 mg total) by mouth daily. 01/21/18   Jaynee Eagles, PA-C  Dulaglutide (TRULICITY) 1.5 RW/4.3XV SOPN Inject 1.5 mg into the skin once a week. 01/17/21   Horald Pollen, MD  fluticasone [facility]) 50  MCG/ACT nasal spray Place 2 sprays into both nostrils daily. 01/09/21   [name], MD  lisinopril (ZESTRIL) 20 MG tablet Take 1 tablet (20 mg total) by mouth daily. 01/09/21   [name], MD  metFORMIN (GLUCOPHAGE) 1000 MG tablet Take 1 tablet (1,000 mg total) by mouth 2 (two) times daily with a meal. 10/02/20 07/24/21  Horald Pollen, MD  Multiple Vitamin (MULTIVITAMIN PO) Take 1 tablet by mouth daily.    [provider]    Allergies    Penicillins  Review of Systems   Review of Systems  Constitutional:  Negative for chills and fever.  HENT:  Negative for ear pain and sore throat.   Eyes:  Negative for pain and visual disturbance.  Respiratory:  Negative for cough and shortness of breath.   Cardiovascular:  Negative for chest pain and palpitations.  Gastrointestinal:  Negative for abdominal pain and vomiting.  Genitourinary:  Negative for dysuria and hematuria.  Musculoskeletal:  Negative for arthralgias and back pain.  Skin:  Negative for color change and rash.  Neurological:  Positive for weakness. Negative for seizures and syncope.  All other systems reviewed and are negative.  Physical Exam Updated Vital Signs BP 134/77    Pulse 95    Temp 98.3 F (36.8 C) (Oral)    Resp 20    Ht 6' (1.829 m)    Wt (!) 149.7 kg    SpO2 98%    BMI 44.76 kg/m   Physical Exam Vitals and nursing note reviewed.  Constitutional:      General: He is not in acute distress.    Appearance: He is well-developed.  HENT:     Head: Normocephalic and atraumatic.  Eyes:     Conjunctiva/sclera: Conjunctivae normal.  Cardiovascular:     Rate and Rhythm: Normal rate and regular rhythm.     Heart sounds: No murmur heard. Pulmonary:     Effort: Pulmonary effort is normal. No respiratory distress.     Breath sounds: Normal breath sounds.  Abdominal:     Palpations: Abdomen is soft.     Tenderness: There is no abdominal tenderness.  Musculoskeletal:        General: No  swelling.     Cervical back: Neck supple.  Skin:    General: Skin is warm and dry.     Capillary Refill: Capillary refill takes less than 2 seconds.  Neurological:     Mental Status: He is alert and oriented to person, place, and time.     GCS: GCS eye subscore is 4. GCS verbal subscore is 5. GCS motor subscore is 6.     Cranial Nerves: Dysarthria and facial asymmetry  present.     Sensory: Sensory deficit present.     Motor: Weakness present.     Comments: Right upper and lower extremity weakness. No effort against gravity.  Dysarthria present.  Minimal facial droop right side  Vision intact PERRL  Psychiatric:        Mood and Affect: Mood normal.    ED Results / Procedures / Treatments   Labs (all labs ordered are listed, but only abnormal results are displayed) Labs Reviewed  CBC - Abnormal; Notable for the following components:      Result Value   WBC 12.2 (*)    All other components within normal limits  DIFFERENTIAL - Abnormal; Notable for the following components:   Neutro Abs 8.6 (*)    All other components within normal limits  COMPREHENSIVE METABOLIC PANEL - Abnormal; Notable for the following components:   Potassium 3.1 (*)    Glucose, Bld 176 (*)    Calcium 8.3 (*)    All other components within normal limits  I-STAT CHEM 8, ED - Abnormal; Notable for the following components:   Potassium 3.2 (*)    Glucose, Bld 173 (*)    Calcium, Ion 1.05 (*)    All other components within normal limits  CBG MONITORING, ED - Abnormal; Notable for the following components:   Glucose-Capillary 175 (*)    All other components within normal limits  RESP PANEL BY RT-PCR (FLU A&B, COVID) ARPGX2  PROTIME-INR  APTT  HIV ANTIBODY (ROUTINE TESTING W REFLEX)  LIPID PANEL  ETHANOL  HEMOGLOBIN A1C  URINALYSIS, ROUTINE W REFLEX MICROSCOPIC  MAGNESIUM  PHOSPHORUS  TYPE AND SCREEN  ABO/RH    EKG EKG Interpretation  Date/Time:  Thursday July 25 2021 09:33:03  EST Ventricular Rate:  116 PR Interval:  88 QRS Duration: 179 QT Interval:  424 QTC Calculation: 590 R Axis:   -74 Text Interpretation: Sinus tachycardia Atrial premature complex Left atrial enlargement RBBB and LAFB as seen on previous ECG on January 18 2021 Confirmed by Campbell Stall ([id number]) on 72/03/4708 9:39:40 AM  Radiology Chest Port 1 View  Result Date: 07/25/2021 CLINICAL DATA:  Stroke, hemorrhagic. EXAM: PORTABLE CHEST 1 VIEW COMPARISON:  None. FINDINGS: 1015 hours. Two views submitted. The heart appears mildly enlarged. There is vascular congestion and mild bibasilar atelectasis, but no confluent airspace opacity, edema, significant pleural effusion or pneumothorax. Mild degenerative changes are present within the spine. No acute osseous findings are evident. Telemetry leads overlie the chest. IMPRESSION: Cardiomegaly with mild vascular congestion and basilar atelectasis. Electronically Signed   By: Richardean Sale M.D.   On: 07/25/2021 10:26   CT HEAD CODE STROKE WO CONTRAST  Result Date: 07/25/2021 CLINICAL DATA:  Code stroke.  Right-sided weakness EXAM: CT HEAD WITHOUT CONTRAST TECHNIQUE: Contiguous axial images were obtained from the base of the skull through the vertex without intravenous contrast. COMPARISON:  None. FINDINGS: Brain: There is an acute intraparenchymal hematoma centered in the left internal capsule measuring 1.5 cm AP by 1.4 cm TV by 1.8 cm cc. There is mild surrounding edema without significant mass effect. A small satellite hemorrhage is also seen in the distal left caudate body. There is no evidence of acute infarct or extra-axial fluid collection. Background parenchymal volume is normal. The ventricles are normal in size. There is no solid mass lesion.  There is no midline shift. Vascular: No hyperdense vessel or unexpected calcification. Skull: Normal. Negative for fracture or focal lesion. Sinuses/Orbits: The paranasal sinuses are clear. The globes  and orbits are  unremarkable. Other: None. IMPRESSION: 1.8 cm acute intraparenchymal hematoma centered in the left internal capsule with mild surrounding edema but no significant mass effect, and smaller adjacent hematoma in the distal left caudate. These results were called by telephone at the time of interpretation on 07/25/2021 at 9:27 am to provider Dr. Theda Sers, who verbally acknowledged these results. Electronically Signed   By: Valetta Mole M.D.   On: 07/25/2021 09:27   CT ANGIO HEAD CODE STROKE  Result Date: 07/25/2021 CLINICAL DATA:  Intracranial hemorrhage EXAM: CT ANGIOGRAPHY HEAD TECHNIQUE: Multidetector CT imaging of the head was performed using the standard protocol during bolus administration of intravenous contrast. Multiplanar CT image reconstructions and MIPs were obtained to evaluate the vascular anatomy. CONTRAST:  72mL OMNIPAQUE IOHEXOL 350 MG/ML SOLN COMPARISON:  Same-day noncontrast CT head FINDINGS: CTA HEAD Anterior circulation: There is minimal atherosclerotic plaque in the intracranial ICAs without hemodynamically significant stenosis or occlusion. The bilateral MCAs are patent with mild atherosclerotic irregularity of the distal branches. There is severe stenosis at the origin of the right A1 segment (8-93). The anterior cerebral arteries are otherwise patent. The anterior communicating artery is patent. The distal ACAs are primarily fed by the prominent left A2 segment. There is a tiny posteriorly directed outpouching arising from the anterior communicating artery which could reflect a small (1 mm) aneurysm (7-96). There is no arteriovenous malformation. Posterior circulation: The bilateral V4 segments are patent. The basilar artery is patent. There is a fetal origin of the right PCA. The bilateral PCAs are patent with mild atherosclerotic irregularity of the distal branches There is no aneurysm or AVM. Venous sinuses: Not well evaluated due to contrast timing. Anatomic variants: As above. Other:  There is no evidence of active contrast extravasation into the intraparenchymal hematoma. Review of the MIP images confirms the above findings. IMPRESSION: 1. No evidence of active extravasation into the intraparenchymal hematoma. 2. No evidence of AVM. 3. 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 4. 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 Otherwise, patent intracranial vasculature. Electronically Signed   By: Valetta Mole M.D.   On: 07/25/2021 09:45    Procedures .Critical Care Performed by: Lianne Cure, DO Authorized by: Lianne Cure, DO   Critical care provider statement:    Critical care time (minutes):  30   Critical care start time:  07/25/2021 9:00 AM   Critical care was necessary to treat or prevent imminent or life-threatening deterioration of the following conditions: stroke.   Critical care was time spent personally by me on the following activities:  Development of treatment plan with patient or surrogate, discussions with consultants, evaluation of patient's response to treatment, examination of patient, ordering and review of laboratory studies, ordering and review of radiographic studies, ordering and performing treatments and interventions, pulse oximetry, re-evaluation of patient's condition and review of old charts Comments:     Stroke with tpa admin   Medications Ordered in ED Medications   stroke: mapping our early stages of recovery book (has no administration in time range)  acetaminophen (TYLENOL) tablet 650 mg (has no administration in time range)    Or  acetaminophen (TYLENOL) 160 MG/5ML solution 650 mg (has no administration in time range)    Or  acetaminophen (TYLENOL) suppository 650 mg (has no administration in time range)  senna-docusate (Senokot-S) tablet 1 tablet (has no administration in time  range)  pantoprazole (PROTONIX)  injection 40 mg (has no administration in time range)  acetaminophen-codeine (TYLENOL #3) 300-30 MG per tablet 1-2 tablet (has no administration in time range)  sodium chloride flush (NS) 0.9 % injection 3 mL (3 mLs Intravenous Given 07/25/21 0940)  coag fact Xa recombinant (ANDEXXA) low dose infusion 900 mg (900 mg Intravenous New Bag/Given 07/25/21 0940)  iohexol (OMNIPAQUE) 350 MG/ML injection 75 mL (75 mLs Intravenous Contrast Given 07/25/21 0931)  labetalol (NORMODYNE) 5 MG/ML injection (10 mg  Given 07/25/21 0942)  clevidipine (CLEVIPREX) 0.5 MG/ML infusion (4 mg/hr  Rate/Dose Change 07/25/21 1025)    ED Course  I have reviewed the triage vital signs and the nursing notes.  Pertinent labs & imaging results that were available during my care of the patient were reviewed by me and considered in my medical decision making (see chart for details).    MDM Rules/Calculators/A&P  10:58 AM 65 yo male with pmh of HTN, hyperlipidemia, DM, obesity, afib on eliquis presenting for right sided upper and lower extremity weakness.  Pt evaluated at bridge and taken directly to CT.  Symptom onset: 0800 this morning. Last normal: 0300 this morning NIH Stroke Scale: 9 for mild dysarthria, minimal facial asymmetry, and right upper and right lower extremity weakness.   CT head demonstrates: 1.8 cm acute intraparenchymal hematoma centered in the left internal capsule with mild surrounding edema but no significant mass effect, and smaller adjacent hematoma in the distal left caudate.  CTA demonstrates 1. No evidence of active extravasation into the intraparenchymal hematoma. 2. No evidence of AVM. 3. 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 4. 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 Otherwise, patent intracranial vasculature  Andexxa admin by neurology  Sister up to  date and on way to ER from [facility]   Patient admitted for further monitoring.    Final Clinical Impression(s) / ED Diagnoses Final diagnoses:  Stroke-like symptoms  Dysarthria  Acute right-sided weakness  Intraparenchymal hematoma of brain with loss of consciousness, unspecified laterality, initial encounter [facility])    Rx / DC Orders ED Discharge Orders     None        Lianne Cure, DO 54/00/86 1014

## 2021-07-25 NOTE — Plan of Care (Signed)
Mg low today at 1.3. Pharmacy is administering 4 g IV with slow infusion for correction. Ordering repeat Mg level for 10 AM Friday.   Electronically signed: Dr. Kerney Elbe

## 2021-07-25 NOTE — H&P (Signed)
Neurology H&P  Tobiah Celestine MR# [id number] 07/25/2021   CC: code stroke  History is obtained from: patient and EMS and chart.  HPI: Milburn Freeney is a 65 y.o. male PMHx as reviewed below, atrial fibrillation (apixaban) woke this morning 0715 to go to the bathroom and spoke to his roommate and went back to bed. On waking, he tried to get up and fell because of right sided weakness.  Pt arrives via EMS as Code stroke with LSN 0715 this am when he walked to the bathroom and was conversational with his roommate. He then went back to bed but when he awoke again, fell because when he attempted to walk was flaccid on the R side. Pt has slurred speech, R facial droop and R sided weakness. On eliquis for afib and has not missed any doses.  LKW: 0715 tNK given: No indicated IR Thrombectomy No, not indicated Modified Rankin Scale: 0-Completely asymptomatic and back to baseline post- stroke NIHSS: 11 LOC Responsiveness 0 LOC Questions 0 LOC Commands 0 Horizontal eye movement 0 Visual field 0 Facial palsy 1 Motor arm - Right arm 4 Motor arm - Left arm 0 Motor leg - Right leg 3 Motor leg - Left leg 0 Limb ataxia 0 Sensory test 1 Language 0 Speech 2 Extinction and inattention 0  ROS: A complete ROS was performed and is negative except as noted in the HPI.   Past Medical History:  Diagnosis Date   Allergy    seasonal allergies   Diabetes mellitus without complication ([place])    Hyperlipidemia    Hypertension    Hypothyroidism    Joint pain    SOBOE (shortness of breath on exertion)    Family History  Problem Relation Age of Onset   Diabetes Mother    Obesity Mother    Diabetes Father    Hypertension Father    Heart disease Father    Obesity Father    Colon polyps Neg Hx    Colon cancer Neg Hx    Esophageal cancer Neg Hx    Stomach cancer Neg Hx    Rectal cancer Neg Hx    Social History:  reports that he quit smoking about 22 years ago. His smoking use included  cigarettes. He has a 10.00 pack-year smoking history. He has never used smokeless tobacco. He reports current alcohol use of about 1.0 standard drink per week. He reports that he does not use drugs.  Prior to Admission medications   Medication Sig Start Date End Date Taking? Authorizing Provider  acetaminophen (TYLENOL) 325 MG tablet Take 650 mg by mouth 2 (two) times daily.    [provider]  amLODipine (NORVASC) 10 MG tablet Take 1 tablet (10 mg total) by mouth daily. 10/02/20   Horald Pollen, MD  apixaban (ELIQUIS) 5 MG TABS tablet Take 1 tablet (5 mg total) by mouth 2 (two) times daily. 03/14/21   Fenton, Clint R, PA  atorvastatin (LIPITOR) 40 MG tablet Take 1 tablet (40 mg total) by mouth daily. 01/09/21   [name], MD  calcium carbonate (TUMS EX) 750 MG chewable tablet Chew 750-1,500 mg by mouth daily as needed for heartburn.    [provider]  cetirizine (ZYRTEC) 10 MG tablet Take 1 tablet (10 mg total) by mouth daily. 01/21/18   Jaynee Eagles, PA-C  Dulaglutide (TRULICITY) 1.5 LS/9.3TD SOPN Inject 1.5 mg into the skin once a week. 01/17/21   Horald Pollen, MD  fluticasone [facility]) 50 MCG/ACT nasal spray  Place 2 sprays into both nostrils daily. 01/09/21   [name], MD  lisinopril (ZESTRIL) 20 MG tablet Take 1 tablet (20 mg total) by mouth daily. 01/09/21   [name], MD  metFORMIN (GLUCOPHAGE) 1000 MG tablet Take 1 tablet (1,000 mg total) by mouth 2 (two) times daily with a meal. 10/02/20 07/24/21  Horald Pollen, MD  Multiple Vitamin (MULTIVITAMIN PO) Take 1 tablet by mouth daily.    [provider]   Exam: Current vital signs: BP 126/73    Pulse 92    Temp 98.3 F (36.8 C) (Oral)    Resp (!) 21    Ht 6' (1.829 m)    Wt (!) 149.7 kg    SpO2 94%    BMI 44.76 kg/m   Physical Exam  Constitutional: Appears well-developed and well-nourished.  Psych: Affect appropriate to situation Eyes: No scleral injection HENT: No OP  obstruction. Head: Normocephalic.  Cardiovascular: Normal rate and regular rhythm.  Respiratory: Effort normal, symmetric excursions bilaterally, no audible wheezing. GI: Soft.  No distension. There is no tenderness.  Skin: WDI  Neuro: Mental Status: Patient is awake, alert, oriented to person, place, month, year, and situation. Patient is able to give a clear and coherent history. Speech dysarthric, intact comprehension and repetition. No signs of aphasia or neglect. Visual Fields are full. Pupils are equal, round, and reactive to light. EOMI without ptosis or diploplia.  Facial sensation is symmetric to temperature Facial movement is symmetric.  Hearing is intact to voice. Uvula midline and palate elevates symmetrically. Shoulder shrug is symmetric. Tongue is midline without atrophy or fasciculations.  Tone is normal. Bulk is normal. 5/5 strength was present in all four extremities. Sensation is symmetric to light touch and temperature in the arms and legs. Deep Tendon Reflexes: 2+ and symmetric in the biceps and patellae. Toes are downgoing bilaterally. FNF and HKS are intact bilaterally. Gait - Deferred  I have reviewed labs in epic and the pertinent results are: K 3.2  I have reviewed the images obtained: NCT head showed 1.8 cm acute intraparenchymal hematoma centered in the left internal capsule with mild surrounding edema but no significant mass effect, and smaller adjacent hematoma in the distal left caudate. CTA head and neck showed no evidence of active extravasation into the intraparenchymal hematoma, no evidence of AVM. 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 Otherwise, patent intracranial vasculature.  Assessment: Daymian Lill is a 65 y.o. male PMHx as noted above, atrial fibrillation on apixaban  with acute hemorrhagic stroke of right internal capsule ICH 0.   Apixaban in setting of hemorrhagic stroke - STAT Andexxa to reverse coagulopathy.   Impression:  Hemorrhagic stroke left internal capsule. Hypertensive emergency. Right sided weakness. NIHSS 11. ICH 0. On apixaban.  Plan: Elevate head of bed keep head midline.  Blood pressure: MAP >65 SBP 120-140: - Start nicardipine infusion may increase to maximum 73mcg/min as needed to maintain SBP<140. - Labetalol 20mg  minutes as needed if SBP>140.  X-ray chest. Admit to neuro ICU. Consult neurosurgery. Hold antiplatelets and anticoagulation for now. IV fluids gentle hydration. Repeat CT head if clinical worsening. Echocardiogram. MRI brain in 6 hours. Keep platelets >100k, INR<1.4 Replete electrolytes as needed. Labs: Coags, CBC, type and cross, CMP, Mg, Phos, fasting lipids, HbA1c, hCRP, troponins, urinalysis. Maintain O2 sats > 94%. Normothermia - For temperature >37.5C -  acetaminophen 650mg  q4-6 hours PRN. Relative euglycemia (~ <180) and treat if hyperglycemia (>200 mg/dL)/hypoglycemia (< 60mg /dL). Euvolemia - Strict I/Os. Precautions: Airway and herniation, seizure, aspiration. PPx: SCDs for now, Senna/docusate, PPI. Precautions: Aspiration/seizure/fall  This patient is critically ill and at significant risk of neurological worsening, death and care requires constant monitoring of vital signs, hemodynamics,respiratory and cardiac monitoring, neurological assessment, discussion with family, other specialists and medical decision making of high complexity. I spent 73 minutes of neurocritical care time  in the care of  this patient. This was time spent independent of any time provided by nurse practitioner or PA.  Electronically signed by:  Lynnae Sandhoff, MD Page: [phone] 07/25/2021, 10:04 AM

## 2021-07-25 NOTE — Progress Notes (Signed)
°  Echocardiogram 2D Echocardiogram has been performed.  Elmer Ramp 07/25/2021, 3:48 PM

## 2021-07-25 NOTE — ED Provider Notes (Addendum)
[facility] EMERGENCY DEPARTMENT Provider Note   CSN: [id number] Arrival date & time: 07/25/21  [id number]     History Chief Complaint  Patient presents with   Code Stroke    Darius Ingram is a 65 y.o. male.  Patient is a 65 yo male with pmh of HTN, hyperlipidemia, DM, obesity, afib on eliquis presenting for right sided upper and lower extremity weakness that was discovered at 8am upon waking. Pt states he awoke in the middle of the night at 2AM and was up from 2AM-3AM with no symptoms prior to going back to sleep. No prior hx of CVA.   The history is provided by the patient. No language interpreter was used.      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   Atrial fibrillation ([place]) 12/31/2020   Bifascicular block 12/25/2020   Dyslipidemia (high LDL; low HDL) 12/25/2020   Obesity, diabetes, and hypertension syndrome ([place]) 12/25/2020   Class 3 severe obesity with serious comorbidity and body mass index (BMI) of 45.0 to 49.9 in adult ([place]) 11/15/2020   Visceral obesity 10/29/2020   Longstanding persistent atrial fibrillation ([place]) 10/15/2020   Secondary hypercoagulable state ([place]) 10/15/2020   Diabetes mellitus ([place]) 01/24/2020   Nuclear sclerotic cataract of both eyes 01/24/2020   Uncontrolled type 2 diabetes mellitus with hyperglycemia ([place]) 12/29/2019   Hypertension associated with diabetes ([place]) 12/29/2019   Body mass index (BMI) of 45.0-49.9 in adult [facility]) 12/29/2019    Past Surgical History:  Procedure Laterality Date   CARDIOVERSION N/A 01/09/2021   Procedure: CARDIOVERSION;  Surgeon: [name], MD;  Location: [facility] ENDOSCOPY;   Service: Cardiovascular;  Laterality: N/A;   TONSILECTOMY, ADENOIDECTOMY, BILATERAL [place]       Family History  Problem Relation Age of Onset   Diabetes Mother    Obesity Mother    Diabetes Father    Hypertension Father    Heart disease Father    Obesity Father    Colon polyps Neg Hx    Colon cancer Neg Hx    Esophageal cancer Neg Hx    Stomach cancer Neg Hx    Rectal cancer Neg Hx     Social History   Tobacco Use   Smoking status: Former    Packs/day: 1.00    Years: 10.00    Pack years: 10.00    Types: Cigarettes    Quit date: 12/29/1998    Years since quitting: 22.5   Smokeless tobacco: Never  Vaping Use   Vaping Use: Never used  Substance Use Topics   Alcohol use: Yes    Alcohol/week: 1.0 standard drink    Types: 1 Cans of beer per week    Comment: occ   Drug use: No    Home Medications Prior to Admission medications   Medication Sig Start Date End Date Taking? Authorizing Provider  acetaminophen (TYLENOL) 650 MG CR tablet Take 650 mg by mouth daily.   Yes [provider]  amLODipine (NORVASC) 10 MG tablet Take 1 tablet (10 mg  total) by mouth daily. 10/02/20  Yes Sagardia, Ines Bloomer, MD  apixaban (ELIQUIS) 5 MG TABS tablet Take 1 tablet (5 mg total) by mouth 2 (two) times daily. 03/14/21  Yes Fenton, Clint R, PA  atorvastatin (LIPITOR) 40 MG tablet Take 1 tablet (40 mg total) by mouth daily. Patient taking differently: Take 40 mg by mouth daily. 01/09/21  Yes [name], MD  calcium carbonate (TUMS EX) 750 MG chewable tablet Chew 750-1,500 mg by mouth daily as needed for heartburn.   Yes [provider]  cetirizine (ZYRTEC) 10 MG tablet Take 1 tablet (10 mg total) by mouth daily. 01/21/18  Yes Jaynee Eagles, PA-C  Dulaglutide (TRULICITY) 1.5 ZO/1.0RU SOPN Inject 1.5 mg into the skin once a week. 01/17/21  Yes Sagardia, Ines Bloomer, MD  fluticasone [facility]) 50 MCG/ACT  nasal spray Place 2 sprays into both nostrils daily. Patient taking differently: Place 2 sprays into both nostrils daily as needed for allergies. 01/09/21  Yes [name], MD  lisinopril (ZESTRIL) 20 MG tablet Take 1 tablet (20 mg total) by mouth daily. Patient taking differently: Take 20 mg by mouth daily. 01/09/21  Yes [name], MD  metFORMIN (GLUCOPHAGE) 1000 MG tablet Take 1 tablet (1,000 mg total) by mouth 2 (two) times daily with a meal. 10/02/20 07/25/21 Yes Sagardia, Ines Bloomer, MD  Multiple Vitamin (MULTIVITAMIN PO) Take 1 tablet by mouth daily.   Yes [provider]    Allergies    Penicillins  Review of Systems   Review of Systems  Constitutional:  Negative for chills and fever.  HENT:  Negative for ear pain and sore throat.   Eyes:  Negative for pain and visual disturbance.  Respiratory:  Negative for cough and shortness of breath.   Cardiovascular:  Negative for chest pain and palpitations.  Gastrointestinal:  Negative for abdominal pain and vomiting.  Genitourinary:  Negative for dysuria and hematuria.  Musculoskeletal:  Negative for arthralgias and back pain.  Skin:  Negative for color change and rash.  Neurological:  Positive for weakness. Negative for seizures and syncope.  All other systems reviewed and are negative.  Physical Exam Updated Vital Signs BP (!) 151/106    Pulse (!) 118    Temp 98.2 F (36.8 C) (Oral)    Resp 16    Ht 6' (1.829 m)    Wt (!) 149.7 kg    SpO2 96%    BMI 44.76 kg/m   Physical Exam Vitals and nursing note reviewed.  Constitutional:      General: He is not in acute distress.    Appearance: He is well-developed.  HENT:     Head: Normocephalic and atraumatic.  Eyes:     Conjunctiva/sclera: Conjunctivae normal.  Cardiovascular:     Rate and Rhythm: Normal rate and regular rhythm.     Heart sounds: No murmur heard. Pulmonary:     Effort: Pulmonary effort is normal. No respiratory distress.     Breath sounds:  Normal breath sounds.  Abdominal:     Palpations: Abdomen is soft.     Tenderness: There is no abdominal tenderness.  Musculoskeletal:        General: No swelling.     Cervical back: Neck supple.  Skin:    General: Skin is warm and dry.     Capillary Refill: Capillary refill takes less than 2 seconds.  Neurological:     Mental Status: He is alert and oriented to person, place, and time.     GCS:  GCS eye subscore is 4. GCS verbal subscore is 5. GCS motor subscore is 6.     Cranial Nerves: Dysarthria and facial asymmetry present.     Sensory: Sensory deficit present.     Motor: Weakness present.     Comments: Right upper and lower extremity weakness. No effort against gravity.  Dysarthria present.  Minimal facial droop right side  Vision intact PERRL  Psychiatric:        Mood and Affect: Mood normal.    ED Results / Procedures / Treatments   Labs (all labs ordered are listed, but only abnormal results are displayed) Labs Reviewed  CBC - Abnormal; Notable for the following components:      Result Value   WBC 12.2 (*)    All other components within normal limits  DIFFERENTIAL - Abnormal; Notable for the following components:   Neutro Abs 8.6 (*)    All other components within normal limits  COMPREHENSIVE METABOLIC PANEL - Abnormal; Notable for the following components:   Potassium 3.1 (*)    Glucose, Bld 176 (*)    Calcium 8.3 (*)    All other components within normal limits  LIPID PANEL - Abnormal; Notable for the following components:   HDL 29 (*)    All other components within normal limits  HEMOGLOBIN A1C - Abnormal; Notable for the following components:   Hgb A1c MFr Bld 6.3 (*)    All other components within normal limits  URINALYSIS, ROUTINE W REFLEX MICROSCOPIC - Abnormal; Notable for the following components:   Hgb urine dipstick TRACE (*)    All other components within normal limits  MAGNESIUM - Abnormal; Notable for the following components:   Magnesium 1.3  (*)    All other components within normal limits  URINALYSIS, MICROSCOPIC (REFLEX) - Abnormal; Notable for the following components:   Bacteria, UA MANY (*)    All other components within normal limits  GLUCOSE, CAPILLARY - Abnormal; Notable for the following components:   Glucose-Capillary 115 (*)    All other components within normal limits  CBC - Abnormal; Notable for the following components:   WBC 12.2 (*)    All other components within normal limits  BASIC METABOLIC PANEL - Abnormal; Notable for the following components:   Potassium 3.2 (*)    Glucose, Bld 135 (*)    Calcium 8.5 (*)    All other components within normal limits  GLUCOSE, CAPILLARY - Abnormal; Notable for the following components:   Glucose-Capillary 129 (*)    All other components within normal limits  GLUCOSE, CAPILLARY - Abnormal; Notable for the following components:   Glucose-Capillary 148 (*)    All other components within normal limits  GLUCOSE, CAPILLARY - Abnormal; Notable for the following components:   Glucose-Capillary 143 (*)    All other components within normal limits  I-STAT CHEM 8, ED - Abnormal; Notable for the following components:   Potassium 3.2 (*)    Glucose, Bld 173 (*)    Calcium, Ion 1.05 (*)    All other components within normal limits  CBG MONITORING, ED - Abnormal; Notable for the following components:   Glucose-Capillary 175 (*)    All other components within normal limits  RESP PANEL BY RT-PCR (FLU A&B, COVID) ARPGX2  PROTIME-INR  APTT  HIV ANTIBODY (ROUTINE TESTING W REFLEX)  ETHANOL  PHOSPHORUS  MAGNESIUM  CBC  BASIC METABOLIC PANEL  MAGNESIUM  TYPE AND SCREEN  ABO/RH    EKG EKG Interpretation  Date/Time:  Thursday July 25 2021 09:33:03 EST Ventricular Rate:  116 PR Interval:  88 QRS Duration: 179 QT Interval:  424 QTC Calculation: 590 R Axis:   -74 Text Interpretation: Sinus tachycardia Atrial premature complex Left atrial enlargement RBBB and LAFB as  seen on previous ECG on January 18 2021 Confirmed by Campbell Stall ([id number]) on 96/28/3662 9:39:40 AM  Radiology MR BRAIN WO CONTRAST  Result Date: 07/26/2021 CLINICAL DATA:  Stroke, follow up Hemorrhagic stroke EXAM: MRI HEAD WITHOUT CONTRAST TECHNIQUE: Multiplanar, multiecho pulse sequences of the brain and surrounding structures were obtained without intravenous contrast. COMPARISON:  CT head 07/25/2021 FINDINGS: Brain: Acute left thalamocapsular intraparenchymal hemorrhage measuring up to approximately 2.0 cm, similar versus slightly increased in size relative to recent CT head when comparing across modalities. Mild surrounding edema. There is some surrounding diffusion signal abnormality; however, this could be related to the hemorrhage. No evidence of a mass; however, acute blood products limit evaluation. Many small acute infarcts in the left frontal lobe. Additional scattered T2/FLAIR hyperintensities in the white matter, nonspecific but compatible with chronic microvascular ischemic disease. Small remote infarct in the right thalamus. No hydrocephalus, midline shift, or extra-axial fluid collection. Vascular: Major arterial flow voids are maintained at the skull base. Skull and upper cervical spine: Normal marrow signal. Sinuses/Orbits: Mild paranasal sinus mucosal thickening. Unremarkable orbits. Other: No mastoid effusions. IMPRESSION: 1. Acute left thalamocapsular intraparenchymal hemorrhage measuring up to approximately 2.0 cm, similar versus slightly increased in size relative to recent CT head when comparing across modalities. 2. Many small acute infarcts in the left frontal lobe. Electronically Signed   By: Margaretha Sheffield M.D.   On: 07/26/2021 13:22   Chest Port 1 View  Result Date: 07/25/2021 CLINICAL DATA:  Stroke, hemorrhagic. EXAM: PORTABLE CHEST 1 VIEW COMPARISON:  None. FINDINGS: 1015 hours. Two views submitted. The heart appears mildly enlarged. There is vascular congestion and mild  bibasilar atelectasis, but no confluent airspace opacity, edema, significant pleural effusion or pneumothorax. Mild degenerative changes are present within the spine. No acute osseous findings are evident. Telemetry leads overlie the chest. IMPRESSION: Cardiomegaly with mild vascular congestion and basilar atelectasis. Electronically Signed   By: Richardean Sale M.D.   On: 07/25/2021 10:26   ECHOCARDIOGRAM COMPLETE  Result Date: 07/25/2021    ECHOCARDIOGRAM REPORT   Patient Name:   Darius Ingram Date of Exam: 07/25/2021 Medical Rec #:  [id number]          Height:       72.0 in Accession #:    [id number]         Weight:       330.0 lb Date of Birth:  [birth year]          BSA:          2.636 m Patient Age:    25 years           BP:           124/60 mmHg Patient Gender: M                  HR:           91 bpm. Exam Location:  Inpatient Procedure: 2D Echo, Cardiac Doppler and Color Doppler Indications:    Stroke I63.9  History:        Patient has prior history of Echocardiogram examinations, most                 recent 11/14/2020. Arrythmias:Atrial Fibrillation; Risk  Factors:Hypertension, Diabetes and Dyslipidemia.  Sonographer:    Merrie Roof RDCS Referring Phys: [id number] [place]  1. Basal to mid inferior hypokinesis. Left ventricular ejection fraction, by estimation, is 55 to 60%. The left ventricle has normal function. The left ventricle demonstrates regional wall motion abnormalities (see scoring diagram/findings for description). There is moderate concentric left ventricular hypertrophy. Left ventricular diastolic parameters are consistent with Grade I diastolic dysfunction (impaired relaxation).  2. Right ventricular systolic function is normal. The right ventricular size is normal. There is normal pulmonary artery systolic pressure.  3. Left atrial size was mildly dilated.  4. The mitral valve is normal in structure. No evidence of mitral valve regurgitation. No evidence  of mitral stenosis.  5. The aortic valve is tricuspid. Aortic valve regurgitation is not visualized. No aortic stenosis is present.  6. Aortic dilatation noted. There is mild dilatation of the ascending aorta, measuring 40 mm.  7. The inferior vena cava is normal in size with greater than 50% respiratory variability, suggesting right atrial pressure of 3 mmHg. FINDINGS  Left Ventricle: Basal to mid inferior hypokinesis. Left ventricular ejection fraction, by estimation, is 55 to 60%. The left ventricle has normal function. The left ventricle demonstrates regional wall motion abnormalities. The left ventricular internal  cavity size was normal in size. There is moderate concentric left ventricular hypertrophy. Left ventricular diastolic parameters are consistent with Grade I diastolic dysfunction (impaired relaxation). Right Ventricle: The right ventricular size is normal. No increase in right ventricular wall thickness. Right ventricular systolic function is normal. There is normal pulmonary artery systolic pressure. The tricuspid regurgitant velocity is 1.95 m/s, and  with an assumed right atrial pressure of 3 mmHg, the estimated right ventricular systolic pressure is 17.9 mmHg. Left Atrium: Left atrial size was mildly dilated. Right Atrium: Right atrial size was normal in size. Pericardium: There is no evidence of pericardial effusion. Mitral Valve: The mitral valve is normal in structure. No evidence of mitral valve regurgitation. No evidence of mitral valve stenosis. Tricuspid Valve: The tricuspid valve is normal in structure. Tricuspid valve regurgitation is trivial. No evidence of tricuspid stenosis. Aortic Valve: The aortic valve is tricuspid. Aortic valve regurgitation is not visualized. No aortic stenosis is present. Aortic valve mean gradient measures 6.0 mmHg. Aortic valve peak gradient measures 11.4 mmHg. Aortic valve area, by VTI measures 2.87  cm. Pulmonic Valve: The pulmonic valve was normal in  structure. Pulmonic valve regurgitation is not visualized. No evidence of pulmonic stenosis. Aorta: Aortic dilatation noted. There is mild dilatation of the ascending aorta, measuring 40 mm. Venous: The inferior vena cava is normal in size with greater than 50% respiratory variability, suggesting right atrial pressure of 3 mmHg. IAS/Shunts: No atrial level shunt detected by color flow Doppler.  LEFT VENTRICLE PLAX 2D LVIDd:         5.13 cm LV PW:         1.53 cm LV IVS:        1.53 cm LVOT diam:     2.50 cm LV SV:         73 LV SV Index:   28 LVOT Area:     4.91 cm  RIGHT VENTRICLE RV Basal diam:  3.40 cm RV S prime:     12.10 cm/s TAPSE (M-mode): 1.6 cm LEFT ATRIUM           Index        RIGHT ATRIUM  Index LA diam:      3.60 cm 1.37 cm/m   RA Area:     18.00 cm LA Vol (A2C): 82.8 ml 31.41 ml/m  RA Volume:   46.30 ml  17.57 ml/m LA Vol (A4C): 90.0 ml 34.14 ml/m  AORTIC VALVE AV Area (Vmax):    2.79 cm AV Area (Vmean):   2.73 cm AV Area (VTI):     2.87 cm AV Vmax:           169.00 cm/s AV Vmean:          113.000 cm/s AV VTI:            0.255 m AV Peak Grad:      11.4 mmHg AV Mean Grad:      6.0 mmHg LVOT Vmax:         95.90 cm/s LVOT Vmean:        62.900 cm/s LVOT VTI:          0.149 m LVOT/AV VTI ratio: 0.58  AORTA Ao Root diam: 3.00 cm Ao Asc diam:  4.00 cm TRICUSPID VALVE TR Peak grad:   15.2 mmHg TR Vmax:        195.00 cm/s  SHUNTS Systemic VTI:  0.15 m Systemic Diam: 2.50 cm [name] MD Electronically signed by [name] MD Signature Date/Time: 07/25/2021/11:05:51 PM    Final    CT HEAD CODE STROKE WO CONTRAST  Result Date: 07/25/2021 CLINICAL DATA:  Code stroke.  Right-sided weakness EXAM: CT HEAD WITHOUT CONTRAST TECHNIQUE: Contiguous axial images were obtained from the base of the skull through the vertex without intravenous contrast. COMPARISON:  None. FINDINGS: Brain: There is an acute intraparenchymal hematoma centered in the left internal capsule measuring 1.5 cm AP  by 1.4 cm TV by 1.8 cm cc. There is mild surrounding edema without significant mass effect. A small satellite hemorrhage is also seen in the distal left caudate body. There is no evidence of acute infarct or extra-axial fluid collection. Background parenchymal volume is normal. The ventricles are normal in size. There is no solid mass lesion.  There is no midline shift. Vascular: No hyperdense vessel or unexpected calcification. Skull: Normal. Negative for fracture or focal lesion. Sinuses/Orbits: The paranasal sinuses are clear. The globes and orbits are unremarkable. Other: None. IMPRESSION: 1.8 cm acute intraparenchymal hematoma centered in the left internal capsule with mild surrounding edema but no significant mass effect, and smaller adjacent hematoma in the distal left caudate. These results were called by telephone at the time of interpretation on 07/25/2021 at 9:27 am to provider Dr. Theda Sers, who verbally acknowledged these results. Electronically Signed   By: Valetta Mole M.D.   On: 07/25/2021 09:27   VAS US CAROTID  Result Date: 07/26/2021 Carotid Arterial Duplex Study Patient Name:  Darius Ingram  Date of Exam:   07/26/2021 Medical Rec #: [id number]           Accession #:    [id number] Date of Birth: [birth year]           Patient Gender: M Patient Age:   34 years Exam Location:  [facility] Procedure:      VAS US CAROTID Referring Phys: Rosalin Hawking --------------------------------------------------------------------------------  Indications:       CVA. Risk Factors:      Hypertension, hyperlipidemia, Diabetes. Comparison Study:  No prior study Performing Technologist: Maudry Mayhew MHA, RDMS, RVT, RDCS  Examination Guidelines: A complete evaluation includes B-mode imaging, spectral Doppler, color Doppler, and power  Doppler as needed of all accessible portions of each vessel. Bilateral testing is considered an integral part of a complete examination. Limited examinations for reoccurring  indications may be performed as noted.  Right Carotid Findings: +----------+--------+--------+--------+-----------------------+--------+             PSV cm/s EDV cm/s Stenosis Plaque Description      Comments  +----------+--------+--------+--------+-----------------------+--------+  CCA Prox   49       7                                                   +----------+--------+--------+--------+-----------------------+--------+  CCA Distal 49       9                 smooth and heterogenous           +----------+--------+--------+--------+-----------------------+--------+  ICA Prox   33       12                                                  +----------+--------+--------+--------+-----------------------+--------+  ICA Distal 48       19                                                  +----------+--------+--------+--------+-----------------------+--------+  ECA        81       6                                                   +----------+--------+--------+--------+-----------------------+--------+ +----------+--------+-------+----------------+-------------------+             PSV cm/s EDV cms Describe         Arm Pressure (mmHG)  +----------+--------+-------+----------------+-------------------+  Subclavian 167              Multiphasic, WNL                      +----------+--------+-------+----------------+-------------------+ +---------+--------+--+--------+-+---------+  Vertebral PSV cm/s 33 EDV cm/s 9 Antegrade  +---------+--------+--+--------+-+---------+  Left Carotid Findings: +----------+--------+--------+--------+--------------------------+--------+             PSV cm/s EDV cm/s Stenosis Plaque Description         Comments  +----------+--------+--------+--------+--------------------------+--------+  CCA Prox   81       13                                                     +----------+--------+--------+--------+--------------------------+--------+  CCA Distal 44       15                heterogenous and  irregular           +----------+--------+--------+--------+--------------------------+--------+  ICA Prox   37       9  smooth and heterogenous              +----------+--------+--------+--------+--------------------------+--------+  ICA Distal 76       28                                                     +----------+--------+--------+--------+--------------------------+--------+  ECA        89       7                                                      +----------+--------+--------+--------+--------------------------+--------+ +----------+--------+--------+----------------+-------------------+             PSV cm/s EDV cm/s Describe         Arm Pressure (mmHG)  +----------+--------+--------+----------------+-------------------+  Subclavian 106               Multiphasic, WNL                      +----------+--------+--------+----------------+-------------------+ +---------+--------+--+--------+--+---------+  Vertebral PSV cm/s 49 EDV cm/s 15 Antegrade  +---------+--------+--+--------+--+---------+   Summary: Right Carotid: Velocities in the right ICA are consistent with a 1-39% stenosis. Left Carotid: Velocities in the left ICA are consistent with a 1-39% stenosis. Vertebrals:  Bilateral vertebral arteries demonstrate antegrade flow. Subclavians: Normal flow hemodynamics were seen in bilateral subclavian              arteries. *See table(s) above for measurements and observations.  Electronically signed by Rosalin Hawking MD on 07/26/2021 at 6:25:31 PM.    Final    CT ANGIO HEAD CODE STROKE  Result Date: 07/25/2021 CLINICAL DATA:  Intracranial hemorrhage EXAM: CT ANGIOGRAPHY HEAD TECHNIQUE: Multidetector CT imaging of the head was performed using the standard protocol during bolus administration of intravenous contrast. Multiplanar CT image reconstructions and MIPs were obtained to evaluate the vascular anatomy. CONTRAST:  35mL OMNIPAQUE IOHEXOL 350 MG/ML SOLN COMPARISON:  Same-day noncontrast CT  head FINDINGS: CTA HEAD Anterior circulation: There is minimal atherosclerotic plaque in the intracranial ICAs without hemodynamically significant stenosis or occlusion. The bilateral MCAs are patent with mild atherosclerotic irregularity of the distal branches. There is severe stenosis at the origin of the right A1 segment (8-93). The anterior cerebral arteries are otherwise patent. The anterior communicating artery is patent. The distal ACAs are primarily fed by the prominent left A2 segment. There is a tiny posteriorly directed outpouching arising from the anterior communicating artery which could reflect a small (1 mm) aneurysm (7-96). There is no arteriovenous malformation. Posterior circulation: The bilateral V4 segments are patent. The basilar artery is patent. There is a fetal origin of the right PCA. The bilateral PCAs are patent with mild atherosclerotic irregularity of the distal branches There is no aneurysm or AVM. Venous sinuses: Not well evaluated due to contrast timing. Anatomic variants: As above. Other: There is no evidence of active contrast extravasation into the intraparenchymal hematoma. Review of the MIP images confirms the above findings. IMPRESSION: 1. No evidence of active extravasation into the intraparenchymal hematoma. 2. No evidence of AVM. 3. 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 4. Severe stenosis at the origin of the right A1 segment  and mild atherosclerotic irregularity of the distal MCA and PCA branches. Otherwise, patent intracranial vasculature. Electronically Signed   By: Valetta Mole M.D.   On: 07/25/2021 09:45    Procedures .Critical Care Performed by: Lianne Cure, DO Authorized by: Lianne Cure, DO   Critical care provider statement:    Critical care time (minutes):  30   Critical care start time:  07/25/2021 9:00 AM   Critical care was necessary to treat or prevent imminent or life-threatening deterioration of the  following conditions: stroke.   Critical care was time spent personally by me on the following activities:  Development of treatment plan with patient or surrogate, discussions with consultants, evaluation of patient's response to treatment, examination of patient, ordering and review of laboratory studies, ordering and review of radiographic studies, ordering and performing treatments and interventions, pulse oximetry, re-evaluation of patient's condition and review of old charts Comments:     Stroke with tpa admin   Medications Ordered in ED Medications  acetaminophen (TYLENOL) tablet 650 mg (has no administration in time range)    Or  acetaminophen (TYLENOL) 160 MG/5ML solution 650 mg (has no administration in time range)    Or  acetaminophen (TYLENOL) suppository 650 mg (has no administration in time range)  senna-docusate (Senokot-S) tablet 1 tablet (1 tablet Oral Given 07/26/21 2114)  acetaminophen-codeine (TYLENOL #3) 300-30 MG per tablet 1-2 tablet (has no administration in time range)  clevidipine (CLEVIPREX) infusion 0.5 mg/mL (4 mg/hr Intravenous Infusion Verify 07/26/21 1759)  Chlorhexidine Gluconate Cloth 2 % PADS 6 each (6 each Topical Given 07/26/21 1605)  MEDLINE mouth rinse (15 mLs Mouth Rinse Given 07/26/21 2114)  insulin aspart (novoLOG) injection 0-20 Units (3 Units Subcutaneous Given 07/26/21 1657)  amLODipine (NORVASC) tablet 10 mg (10 mg Oral Given 07/26/21 0926)  lisinopril (ZESTRIL) tablet 20 mg (20 mg Oral Given 07/26/21 0925)  pantoprazole (PROTONIX) EC tablet 40 mg (40 mg Oral Given 07/26/21 2114)  metoprolol tartrate (LOPRESSOR) tablet 25 mg (25 mg Oral Given 07/26/21 2114)  labetalol (NORMODYNE) injection 5-20 mg (has no administration in time range)  sodium chloride flush (NS) 0.9 % injection 3 mL (3 mLs Intravenous Given 07/25/21 0940)  coag fact Xa recombinant (ANDEXXA) low dose infusion 900 mg (0 mg Intravenous Stopped 07/25/21 1201)  iohexol (OMNIPAQUE) 350  MG/ML injection 75 mL (75 mLs Intravenous Contrast Given 07/25/21 0931)  labetalol (NORMODYNE) 5 MG/ML injection (10 mg  Given 07/25/21 3267)   stroke: mapping our early stages of recovery book ( Does not apply Given 07/25/21 1208)  magnesium sulfate IVPB 4 g 100 mL (0 g Intravenous Stopped 07/26/21 0159)  metoprolol tartrate (LOPRESSOR) injection 2.5 mg (2.5 mg Intravenous Given 07/26/21 0419)  potassium chloride SA (KLOR-CON M) CR tablet 40 mEq (40 mEq Oral Given 07/26/21 1245)    ED Course  I have reviewed the triage vital signs and the nursing notes.  Pertinent labs & imaging results that were available during my care of the patient were reviewed by me and considered in my medical decision making (see chart for details).    MDM Rules/Calculators/A&P  9:24 PM 65 yo male with pmh of HTN, hyperlipidemia, DM, obesity, afib on eliquis presenting for right sided upper and lower extremity weakness.  Pt evaluated at bridge and taken directly to CT.  Symptom onset: 0800 this morning. Last normal: 0300 this morning NIH Stroke Scale: 9 for mild dysarthria, minimal facial asymmetry, and right upper and right lower extremity weakness.   CT  head demonstrates: 1.8 cm acute intraparenchymal hematoma centered in the left internal capsule with mild surrounding edema but no significant mass effect, and smaller adjacent hematoma in the distal left caudate.  CTA demonstrates 1. No evidence of active extravasation into the intraparenchymal hematoma. 2. No evidence of AVM. 3. 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 4. 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 Otherwise, patent intracranial vasculature  Andexxa admin by neurology  Sister up to date and on way [facility]   Patient admitted for further monitoring.    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

## 2021-07-25 NOTE — ED Triage Notes (Signed)
Pt arrives via EMS as Code stroke with LSN 0715 this am when he walked to the bathroom and was conversational with his roommate. He then went back to bed but when he awoke again, fell because when he attempted to walk was flaccid on the R side. Pt has slurred speech, R facial droop and R sided weakness. On eliquis for afib and has not missed any doses.

## 2021-07-26 ENCOUNTER — Inpatient Hospital Stay (HOSPITAL_COMMUNITY): Payer: Medicare Other

## 2021-07-26 ENCOUNTER — Encounter (HOSPITAL_COMMUNITY): Payer: Self-pay | Admitting: Neurology

## 2021-07-26 DIAGNOSIS — I619 Nontraumatic intracerebral hemorrhage, unspecified: Secondary | ICD-10-CM

## 2021-07-26 DIAGNOSIS — I4819 Other persistent atrial fibrillation: Secondary | ICD-10-CM

## 2021-07-26 DIAGNOSIS — I63412 Cerebral infarction due to embolism of left middle cerebral artery: Secondary | ICD-10-CM

## 2021-07-26 LAB — BASIC METABOLIC PANEL
Anion gap: 9 (ref 5–15)
BUN: 22 mg/dL (ref 8–23)
CO2: 24 mmol/L (ref 22–32)
Calcium: 8.5 mg/dL — ABNORMAL LOW (ref 8.9–10.3)
Chloride: 106 mmol/L (ref 98–111)
Creatinine, Ser: 1.13 mg/dL (ref 0.61–1.24)
GFR, Estimated: >60 mL/min (ref >60)
Glucose, Bld: 135 mg/dL — ABNORMAL HIGH (ref 70–99)
Potassium: 3.2 mmol/L — ABNORMAL LOW (ref 3.5–5.1)
Sodium: 139 mmol/L (ref 135–145)

## 2021-07-26 LAB — CBC
HCT: 41.1 % (ref 39.0–52.0)
Hemoglobin: 14 g/dL (ref 13.0–17.0)
MCH: 29.5 pg (ref 26.0–34.0)
MCHC: 34.1 g/dL (ref 30.0–36.0)
MCV: 86.5 fL (ref 80.0–100.0)
Platelets: 262 10*3/uL (ref 150–400)
RBC: 4.75 MIL/uL (ref 4.22–5.81)
RDW: 14.5 % (ref 11.5–15.5)
WBC: 12.2 10*3/uL — ABNORMAL HIGH (ref 4.0–10.5)
nRBC: 0 % (ref 0.0–0.2)

## 2021-07-26 LAB — GLUCOSE, CAPILLARY
Glucose-Capillary: 129 mg/dL — ABNORMAL HIGH (ref 70–99)
Glucose-Capillary: 148 mg/dL — ABNORMAL HIGH (ref 70–99)
Glucose-Capillary: 143 mg/dL — ABNORMAL HIGH (ref 70–99)
Glucose-Capillary: 131 mg/dL — ABNORMAL HIGH (ref 70–99)

## 2021-07-26 LAB — MAGNESIUM: Magnesium: 2.3 mg/dL (ref 1.7–2.4)

## 2021-07-26 MED ORDER — AMLODIPINE BESYLATE 10 MG PO TABS
10.0000 mg | ORAL_TABLET | Freq: Every day | ORAL | Status: DC
  Administered 2021-07-26 – 2021-07-31 (×6): 10 mg via ORAL
  Filled 2021-07-26 (×6): qty 1

## 2021-07-26 MED ORDER — INSULIN ASPART 100 UNIT/ML IJ SOLN
0.0000 [IU] | Freq: Three times a day (TID) | INTRAMUSCULAR | Status: DC
  Administered 2021-07-26 – 2021-07-27 (×4): 3 [IU] via SUBCUTANEOUS
  Administered 2021-07-28: 4 [IU] via SUBCUTANEOUS
  Administered 2021-07-28 – 2021-07-29 (×3): 3 [IU] via SUBCUTANEOUS
  Administered 2021-07-29 – 2021-07-30 (×4): 4 [IU] via SUBCUTANEOUS
  Administered 2021-07-30 – 2021-07-31 (×2): 3 [IU] via SUBCUTANEOUS
  Administered 2021-07-31: 7 [IU] via SUBCUTANEOUS

## 2021-07-26 MED ORDER — PANTOPRAZOLE SODIUM 40 MG PO TBEC
40.0000 mg | DELAYED_RELEASE_TABLET | Freq: Every day | ORAL | Status: DC
  Administered 2021-07-26 – 2021-07-30 (×5): 40 mg via ORAL
  Filled 2021-07-26 (×5): qty 1

## 2021-07-26 MED ORDER — METOPROLOL TARTRATE 5 MG/5ML IV SOLN
2.5000 mg | Freq: Once | INTRAVENOUS | Status: AC
  Administered 2021-07-26: 04:00:00 2.5 mg via INTRAVENOUS
  Filled 2021-07-26: qty 5

## 2021-07-26 MED ORDER — LABETALOL HCL 5 MG/ML IV SOLN
5.0000 mg | INTRAVENOUS | Status: DC | PRN
Start: 2021-07-26 — End: 2021-07-31
  Administered 2021-07-27 – 2021-07-28 (×2): 10 mg via INTRAVENOUS
  Filled 2021-07-26 (×2): qty 4

## 2021-07-26 MED ORDER — METOPROLOL TARTRATE 25 MG PO TABS
25.0000 mg | ORAL_TABLET | Freq: Two times a day (BID) | ORAL | Status: DC
  Administered 2021-07-26 – 2021-07-31 (×10): 25 mg via ORAL
  Filled 2021-07-26 (×10): qty 1

## 2021-07-26 MED ORDER — POTASSIUM CHLORIDE CRYS ER 20 MEQ PO TBCR
40.0000 meq | EXTENDED_RELEASE_TABLET | Freq: Once | ORAL | Status: AC
  Administered 2021-07-26: 09:00:00 40 meq via ORAL
  Filled 2021-07-26: qty 2

## 2021-07-26 MED ORDER — METOPROLOL TARTRATE 25 MG PO TABS
12.5000 mg | ORAL_TABLET | Freq: Two times a day (BID) | ORAL | Status: DC
  Administered 2021-07-26: 09:00:00 12.5 mg via ORAL
  Filled 2021-07-26: qty 1

## 2021-07-26 MED ORDER — LISINOPRIL 20 MG PO TABS
20.0000 mg | ORAL_TABLET | Freq: Every day | ORAL | Status: DC
  Administered 2021-07-26 – 2021-07-31 (×6): 20 mg via ORAL
  Filled 2021-07-26 (×6): qty 1

## 2021-07-26 NOTE — Evaluation (Signed)
Physical Therapy Evaluation Patient Details Name: Darius Ingram MRN: [id number] DOB: [birth year] Today's Date: 07/26/2021  History of Present Illness  65 y/o male presented to ED on 12/29 for R sided weakness, R facial droop, and slurred speech. CT head on 12/29 showed 1.8 cm acute intraparenchymal hematoma in L internal capsule and smaller adjacent hematoma in distal L caudate. MRI on 12/30 showed acute L thalamocapsular intraparenchymal hemorrhage measuring up to 2.0 cm and many small acute infarcts in L frontal lobe. PMH: Afib, HTN, hypothyroidism  Clinical Impression  Patient admitted with above diagnosis. Patient presents with R sided weakness, impaired balance, decreased activity tolerance, impaired sensation, impaired coordination, and impaired cognition. Patient requires maxA+2 for bed mobility. Deferred further mobility due to +orthostatics (supine 130/95, sitting 108/98) and patient symptomatic. Patient with slow processing and requires increased time to follow commands. Patient will benefit from skilled PT services during acute stay to address listed deficits. Recommend SNF at discharge as patient does not have family/friends available to assist him at discharge and will likely need to be independent to return to previous home. Will continue to assess.       Recommendations for follow up therapy are one component of a multi-disciplinary discharge planning process, led by the attending physician.  Recommendations may be updated based on patient status, additional functional criteria and insurance authorization.  Follow Up Recommendations Skilled nursing-short term rehab (<3 hours/day)    Assistance Recommended at Discharge Frequent or constant Supervision/Assistance  Functional Status Assessment Patient has had a recent decline in their functional status and/or demonstrates limited ability to make significant improvements in function in a reasonable and predictable amount of time   Equipment Recommendations  Other (comment) (TBD)    Recommendations for Other Services       Precautions / Restrictions Precautions Precautions: Fall Precaution Comments: watch HR and BP Restrictions Weight Bearing Restrictions: No      Mobility  Bed Mobility Overal bed mobility: Needs Assistance Bed Mobility: Supine to Sit;Sit to Supine   Sidelying to sit: Max assist;+2 for physical assistance Supine to sit: Max assist;+2 for physical assistance;+2 for safety/equipment;HOB elevated Sit to supine: Total assist;+2 for physical assistance;+2 for safety/equipment        Transfers                   General transfer comment: deferred standing - poor trunk control and orthostatics    Ambulation/Gait                  Stairs            Wheelchair Mobility    Modified Rankin (Stroke Patients Only) Modified Rankin (Stroke Patients Only) Pre-Morbid Rankin Score: No symptoms Modified Rankin: Severe disability     Balance Overall balance assessment: Needs assistance Sitting-balance support: Single extremity supported;Feet supported Sitting balance-Leahy Scale: Zero Sitting balance - Comments: unable to sit unsupported Postural control: Posterior lean;Right lateral lean                                   Pertinent Vitals/Pain Pain Assessment: Faces Faces Pain Scale: Hurts little more Pain Location: left arm d/t BP cuff Pain Descriptors / Indicators: Discomfort;Grimacing Pain Intervention(s): Monitored during session    Home Living Family/patient expects to be discharged to:: Private residence Living Arrangements: Alone Available Help at Discharge: Friend(s);Available PRN/intermittently Type of Home: House Home Access: Stairs to enter Entrance Stairs-Rails: None Entrance [employer]  of Steps: 2   Home Layout: One level Home Equipment: Cane - single point      Prior Function Prior Level of Function : Independent/Modified  Independent;Driving             Mobility Comments: does not use AD ADLs Comments: cooks 2 meals/day, shares bathroom with roommate     Hand Dominance   Dominant Hand: Right    Extremity/Trunk Assessment   Upper Extremity Assessment Upper Extremity Assessment: Defer to OT evaluation RUE Deficits / Details: 3+/5 grip 3/5 to wrist and forearm 2/5 elbow and trance to shoulder RUE Sensation: decreased light touch RUE Coordination: decreased fine motor;decreased gross motor    Lower Extremity Assessment Lower Extremity Assessment: RLE deficits/detail RLE Deficits / Details: 3-/5 grossly. Requires encouragement to reach full potential RLE Sensation: decreased light touch;decreased proprioception RLE Coordination: decreased fine motor;decreased gross motor    Cervical / Trunk Assessment Cervical / Trunk Assessment: Other exceptions Cervical / Trunk Exceptions: body habitus  Communication   Communication: Expressive difficulties  Cognition Arousal/Alertness: Awake/alert Behavior During Therapy: WFL for tasks assessed/performed Overall Cognitive Status: Impaired/Different from baseline Area of Impairment: Awareness;Problem solving;Following commands;Attention                 Orientation Level: Person;Place;Time Current Attention Level: Sustained   Following Commands: Follows one step commands with increased time   Awareness: Emergent Problem Solving: Requires verbal cues;Slow processing          General Comments General comments (skin integrity, edema, etc.): BP in supine 130/95, BP in sitting 108/98. On 2L O2 [place] on arrival, spO2 96%. Removed for mobility wiht spO2 >92% throughout    Exercises     Assessment/Plan    PT Assessment Patient needs continued PT services  PT Problem List Decreased strength;Decreased activity tolerance;Decreased balance;Decreased mobility;Decreased coordination;Decreased cognition;Decreased knowledge of use of DME;Decreased safety  awareness;Decreased knowledge of precautions;Cardiopulmonary status limiting activity;Impaired sensation       PT Treatment Interventions DME instruction;Gait training;Stair training;Functional mobility training;Therapeutic exercise;Therapeutic activities;Balance training;Patient/family education;Neuromuscular re-education    PT Goals (Current goals can be found in the Care Plan section)  Acute Rehab PT Goals Patient Stated Goal: did not state PT Goal Formulation: With patient Time For Goal Achievement: 08/09/21 Potential to Achieve Goals: Fair    Frequency Min 3X/week   Barriers to discharge        Co-evaluation PT/OT/SLP Co-Evaluation/Treatment: Yes Reason for Co-Treatment: For patient/therapist safety;To address functional/ADL transfers PT goals addressed during session: Mobility/safety with mobility OT goals addressed during session: ADL's and self-care       AM-PAC PT "6 Clicks" Mobility  Outcome Measure Help needed turning from your back to your side while in a flat bed without using bedrails?: Total Help needed moving from lying on your back to sitting on the side of a flat bed without using bedrails?: Total Help needed moving to and from a bed to a chair (including a wheelchair)?: Total Help needed standing up from a chair using your arms (e.g., wheelchair or bedside chair)?: Total Help needed to walk in hospital room?: Total Help needed climbing 3-5 steps with a railing? : Total 6 Click Score: 6    End of Session   Activity Tolerance: Patient tolerated treatment well Patient left: in bed;with call bell/phone within reach Nurse Communication: Mobility status PT Visit Diagnosis: Unsteadiness on feet (R26.81);Muscle weakness (generalized) (M62.81);Difficulty in walking, not elsewhere classified (R26.2);Other symptoms and signs involving the nervous system (R29.898);Hemiplegia and hemiparesis Hemiplegia - Right/Left: Right Hemiplegia -  dominant/non-dominant:  Dominant Hemiplegia - caused by: Nontraumatic intracerebral hemorrhage    Time: 1419-1444 PT Time Calculation (min) (ACUTE ONLY): 25 min   Charges:   PT Evaluation $PT Eval Moderate Complexity: 1 Mod          Lucelia Lacey A. Gilford Rile PT, DPT Acute Rehabilitation Services Pager [phone] Office [phone]   Linna Hoff 07/26/2021, 4:01 PM

## 2021-07-26 NOTE — Evaluation (Signed)
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) 01/24/2020   Nuclear sclerotic cataract of both eyes 01/24/2020   Uncontrolled type 2 diabetes mellitus with hyperglycemia ([place]) 12/29/2019   Hypertension associated with diabetes ([place]) 12/29/2019   Body mass index (BMI) of 45.0-49.9 in adult [facility]) 12/29/2019   Past Medical History:  Past Medical History:  Diagnosis Date   Allergy    seasonal allergies   Atrial fibrillation (HCC)    Diabetes mellitus without complication ([place])    Hyperlipidemia    Hypertension    Hypothyroidism    Joint pain    SOBOE (shortness of breath on exertion)    Past Surgical History:  Past Surgical History:  Procedure Laterality Date   CARDIOVERSION N/A 01/09/2021   Procedure: CARDIOVERSION;  Surgeon: [name], MD;  Location: [facility];  Service: Cardiovascular;  Laterality: N/A;   TONSILECTOMY, ADENOIDECTOMY, BILATERAL MYRINGOTOMY AND TUBES  1960   TYMPANOSTOMY  1960   WISDOM TOOTH EXTRACTION  1978   HPI:  Darius Ingram is a 65 y.o. male PMHx as reviewed below, atrial fibrillation (apixaban) woke this morning 0715 to go to the bathroom and spoke to his roommate and went back to bed. On waking, he tried to get up and  fell because of right sided weakness.     Pt arrives via EMS as Code stroke with LSN 0715 this am when he walked to the bathroom and was conversational with his roommate. He then went back to bed but when he awoke again, fell because when he attempted to walk was flaccid on the R side. Pt has slurred speech, R facial droop and R sided weakness. On eliquis for afib and has not missed any doses.CT head on 07/25/21 indicated 1.8 cm acute intraparenchymal hematoma centered in the left internal  capsule with mild surrounding edema but no significant mass effect,  and smaller adjacent hematoma in the distal left caudate.   MRI pending; SLE generated d/t dysarthria.  Passed Yale swallow screen.   Assessment / Plan / Recommendation Clinical Impression  Pt administered SLUMS (Hawthorne Mental Status Examination) with a score obtained of 22/26 eliminating written portion of assessment d/t dominant hand affected by CVA.  Pt demonstrated deficits in the areas of attention/memory with decreased digit recall past 3 digit (stating backwards), recall of objects with time delay and category cues required for generative naming task.  Reading accurate for environmental signs within room.  Pt exhibited dysarthric speech within phrases-conversation with 50-75% intelligibility noted, imprecise articulation and decreased overall breath support. OME revealed R facial/lingual weakness/sensation loss.  Pt denies dysphagia symptoms and passed Yale swallow screen.  Pt provided with education re: increasing overall intensity, slowing rate and pacing self during conversational tasks to improve intelligibility.  Pt appreciative and able to implement strategies with min verbal cues during speaking tasks.  Auditory  comprehension appeared [facility] for simple paragraph retention and functional tasks.  Pt oriented x4.  Recommend ST f/u while in acute setting for cognitive deficits and dysarthria tx.  Thank you for this consult.    SLP  Assessment  SLP Recommendation/Assessment: Patient needs continued Speech Language Pathology Services SLP Visit Diagnosis: Cognitive communication deficit (R41.841);Dysarthria and anarthria (R47.1)    Recommendations for follow up therapy are one component of a multi-disciplinary discharge planning process, led by the attending physician.  Recommendations may be updated based on patient status, additional functional criteria and insurance authorization.    Follow Up Recommendations  Acute inpatient rehab (3hours/day)    Assistance Recommended at Discharge  Frequent or constant Supervision/Assistance  Functional Status Assessment Patient has had a recent decline in their functional status and demonstrates the ability to make significant improvements in function in a reasonable and predictable amount of time.  Frequency and Duration min 2x/week  1 week      SLP Evaluation Cognition  Overall Cognitive Status: Impaired/Different from baseline Arousal/Alertness: Awake/alert Orientation Level: Oriented X4 Year: 2022 Month: December Day of Week: Correct Attention: Sustained Sustained Attention: Impaired Sustained Attention Impairment: Verbal complex;Functional complex Memory: Impaired Memory Impairment: Retrieval deficit;Decreased recall of new information;Decreased short term memory Decreased Short Term Memory: Verbal basic;Functional basic Immediate Memory Recall: Sock;Blue;Bed Memory Recall Sock: Without Cue Memory Recall Blue: Without Cue Memory Recall Bed: Without Cue Awareness: Appears intact Problem Solving: Appears intact Behaviors: Perseveration Safety/Judgment: Appears intact       Comprehension  Auditory Comprehension Overall Auditory Comprehension: Appears within functional limits for tasks assessed Yes/No Questions: Within Functional Limits Commands: Within Functional Limits Conversation: Complex Interfering Components: Attention;Working  Field seismologist: Conservation officer, nature: Within [employer] Reading Comprehension Reading Status: Within funtional limits    Expression Expression Primary Mode of Expression: Verbal Verbal Expression Overall Verbal Expression: Impaired Initiation: No impairment Level of Generative/Spontaneous Verbalization: Conversation Repetition: No impairment Naming: Impairment Responsive: 76-100% accurate Confrontation: Within functional limits Convergent: 50-74% accurate Divergent: 50-74% accurate Verbal Errors: Perseveration Pragmatics: No impairment Interfering Components: Speech intelligibility Non-Verbal Means of Communication: Not applicable Written Expression Dominant Hand: Right Written Expression: Not tested   Oral / Motor  Oral Motor/Sensory Function Overall Oral Motor/Sensory Function: Mild impairment Facial ROM: Reduced right Facial Symmetry: Abnormal symmetry right Facial Strength: Within Functional Limits Facial Sensation: Reduced right Lingual ROM: Reduced right Lingual Symmetry: Abnormal symmetry right Lingual Strength: Within Functional Limits Lingual Sensation: Reduced Motor Speech Overall Motor Speech: Appears within functional limits for tasks assessed Respiration: Within functional limits Phonation: Low vocal intensity Resonance: Within functional limits Articulation: Impaired Level of Impairment: Phrase Intelligibility: Intelligibility reduced Word: 50-74% accurate Phrase: 50-74% accurate Sentence: 50-74% accurate Conversation: 50-74% accurate Motor Planning: Witnin functional limits Motor Speech Errors: Not applicable Effective Techniques: Slow rate;Increased vocal intensity;Over-articulate;Pause            Elvina Sidle, M.S., CCC-SLP 07/26/2021, 12:04 PM

## 2021-07-26 NOTE — Progress Notes (Signed)
Carotid artery duplex completed. Refer to "CV Proc" under chart review to view preliminary results.  07/26/2021 11:23 AM Kelby Aline., MHA, RVT, RDCS, RDMS

## 2021-07-26 NOTE — TOC CAGE-AID Note (Signed)
Transition of Care [facility]) - CAGE-AID Screening   Patient Details  Name: Darius Ingram MRN: [id number] Date of Birth: [birth year]  [facility] of [facility]) CM/SW Contact:    Ala Capri C Tarpley-Carter, [place] Phone Number: 07/26/2021, 8:22 AM   Clinical Narrative: Pt is unable to participate in Cage Aid.  Pt has slurred speech.  Leyah Bocchino Tarpley-Carter, MSW, LCSW-A Pronouns:  She/Her/Hers [place] Transitions of Care Clinical Social Worker Direct Number:  [phone] [email or web address]   CAGE-AID Screening: Substance Abuse Screening unable to be completed due to: : Patient unable to participate (Pt has slurred speech.)             Substance Abuse Education Offered: No

## 2021-07-26 NOTE — Progress Notes (Signed)
°  Transition of Care [facility]) Screening Note   Patient Details  Name: Darius Ingram Date of Birth: 02-25-1956   [facility]) CM/SW Contact:    Benard Halsted, LCSW Phone Number: 07/26/2021, 9:29 AM    Transition of Care Department [facility]) has reviewed patient and no TOC needs have been identified at this time. We will continue to monitor patient advancement through interdisciplinary progression rounds. If new patient transition needs arise, please place a TOC consult.

## 2021-07-26 NOTE — Evaluation (Signed)
Occupational Therapy Evaluation Patient Details Name: Darius Ingram MRN: [id number] DOB: 10/28/1955 Today's Date: 07/26/2021   History of Present Illness 65 y.o. male PMHx: atrial fibrillation.  Admitted 12/29 for slurred speech, R facial droop and R sided weakness.  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   Clinical Impression   Patient admitted for the diagnosis above.  PTA he rented a room in a subdivided house with other roommates, none of which can provide assist.  The patient drive, did not need an AD for mobility, and was able to complete his own ADL/IADL.  Deficits impacting independence are listed below.  Currently he is needing up to Max A of two for basic mobility, and near dependant care for lower body ADL at bedlevel.  OT to follow int he acute setting, but short term rehab at a local SNF is recommended, as he cannot care for himself without extensive assist.        Recommendations for follow up therapy are one component of a multi-disciplinary discharge planning process, led by the attending physician.  Recommendations may be updated based on patient status, additional functional criteria and insurance authorization.   Follow Up Recommendations  Skilled nursing-short term rehab (<3 hours/day)    Assistance Recommended at Discharge Frequent or constant Supervision/Assistance  Functional Status Assessment  Patient has had a recent decline in their functional status and demonstrates the ability to make significant improvements in function in a reasonable and predictable amount of time.  Equipment Recommendations  Wheelchair cushion (measurements OT);Wheelchair (measurements OT)    Recommendations for Other Services       Precautions / Restrictions Precautions Precautions: Fall Precaution Comments: watch HR and BP Restrictions Weight Bearing  Restrictions: No      Mobility Bed Mobility Overal bed mobility: Needs Assistance Bed Mobility: Sidelying to Sit;Sit to Supine   Sidelying to sit: Max assist;+2 for physical assistance   Sit to supine: Total assist;+2 for physical assistance        Transfers                   General transfer comment: deferred standing - poor trunk control      Balance Overall balance assessment: Needs assistance Sitting-balance support: Single extremity supported;Feet supported Sitting balance-Leahy Scale: Zero Sitting balance - Comments: unalb eot sit unsupported Postural control: Posterior lean;Right lateral lean                                 ADL either performed or assessed with clinical judgement   ADL Overall ADL's : Needs assistance/impaired Eating/Feeding: Set up;Bed level   Grooming: Wash/dry face;Set up;Bed level   Upper Body Bathing: Moderate assistance;Bed level   Lower Body Bathing: Total assistance;Bed level   Upper Body Dressing : Moderate assistance;Bed level   Lower Body Dressing: Total assistance;Bed level                       Vision Patient Visual Report: No change from baseline       Perception Perception Perception: Within Functional Limits   Praxis Praxis Praxis: Intact    Pertinent Vitals/Pain Pain Assessment: Faces Faces Pain Scale: Hurts little more Pain Location: left arm d/t BP cuff Pain Descriptors / Indicators: Discomfort;Grimacing Pain Intervention(s): Monitored during session     Hand Dominance Right  Extremity/Trunk Assessment Upper Extremity Assessment Upper Extremity Assessment: RUE deficits/detail RUE Deficits / Details: 3+/5 grip 3/5 to wrist and forearm 2/5 elbow and trance to shoulder RUE Sensation: decreased light touch RUE Coordination: decreased fine motor;decreased gross motor   Lower Extremity Assessment Lower Extremity Assessment: Defer to PT evaluation   Cervical / Trunk  Assessment Cervical / Trunk Assessment: Other exceptions Cervical / Trunk Exceptions: body habitus   Communication Communication Communication: Expressive difficulties   Cognition Arousal/Alertness: Awake/alert Behavior During Therapy: WFL for tasks assessed/performed Overall Cognitive Status: Impaired/Different from baseline Area of Impairment: Awareness;Problem solving;Following commands;Attention;Orientation                 Orientation Level: Person;Place;Time Current Attention Level: Sustained   Following Commands: Follows one step commands with increased time   Awareness: Emergent Problem Solving: Requires verbal cues       General Comments   Orthostatic with supine to sit.      Exercises     Shoulder Instructions      Home Living Family/patient expects to be discharged to:: Private residence Living Arrangements: Alone Available Help at Discharge: Friend(s);Available PRN/intermittently Type of Home: House Home Access: Stairs to enter [employer] of Steps: 2 Entrance Stairs-Rails: None Home Layout: One level     Bathroom Shower/Tub: Teacher, early years/pre: Handicapped height     Home Equipment: Radio producer - single point      Lives With: Other (Comment)    Prior Functioning/Environment Prior Level of Function : Independent/Modified Independent;Driving             Mobility Comments: does not use AD ADLs Comments: cooks 2 meals/day, shares bathroom with roommate        OT Problem List: Decreased strength;Decreased range of motion;Decreased activity tolerance;Impaired balance (sitting and/or standing);Decreased knowledge of use of DME or AE;Decreased coordination;Cardiopulmonary status limiting activity;Impaired UE functional use      OT Treatment/Interventions: Self-care/ADL training;Therapeutic exercise;Therapeutic activities;Neuromuscular education;Energy conservation;Patient/family education;Balance training;DME and/or AE  instruction    OT Goals(Current goals can be found in the care plan section) Acute Rehab OT Goals Patient Stated Goal: Get better OT Goal Formulation: With patient Time For Goal Achievement: 08/09/21 Potential to Achieve Goals: Good ADL Goals Pt Will Perform Grooming: with supervision;sitting Pt Will Perform Upper Body Bathing: with min assist;sitting Pt Will Perform Upper Body Dressing: with min assist;sitting Pt Will Transfer to Toilet: with min assist;with +2 assist;squat pivot transfer Pt/caregiver will Perform Home Exercise Program: Increased strength;Right Upper extremity;With minimal assist Additional ADL Goal #1: Patient will sit edge of bed with supervision for up to 10 min to increase UB ADL independence.  OT Frequency: Min 2X/week   Barriers to D/C: Decreased caregiver support          Co-evaluation PT/OT/SLP Co-Evaluation/Treatment: Yes Reason for Co-Treatment: Complexity of the patient's impairments (multi-system involvement);For patient/therapist safety;To address functional/ADL transfers   OT goals addressed during session: ADL's and self-care      AM-PAC OT "6 Clicks" Daily Activity     Outcome Measure Help from another person eating meals?: A Little Help from another person taking care of personal grooming?: A Lot Help from another person toileting, which includes using toliet, bedpan, or urinal?: Total Help from another person bathing (including washing, rinsing, drying)?: Total Help from another person to put on and taking off regular upper body clothing?: A Lot Help from another person to put on and taking off regular lower body clothing?: Total 6 Click Score: 10   End  of Session Nurse Communication: Mobility status;Need for lift equipment  Activity Tolerance: Patient tolerated treatment well Patient left: in bed;with call bell/phone within reach  OT Visit Diagnosis: Muscle weakness (generalized) (M62.81);Hemiplegia and hemiparesis Hemiplegia -  Right/Left: Right Hemiplegia - dominant/non-dominant: Dominant Hemiplegia - caused by: Nontraumatic intracerebral hemorrhage;Cerebral infarction                Time: 5051-8335 OT Time Calculation (min): 19 min Charges:  OT General Charges $OT Visit: 1 Visit OT Evaluation $OT Eval Moderate Complexity: 1 Mod  07/26/2021  RP, OTR/L  Acute Rehabilitation Services  Office:  [phone]   Metta Clines 07/26/2021, 3:10 PM

## 2021-07-26 NOTE — Progress Notes (Signed)
MRI will be done at 1100. CT cancelled per Dr Erlinda Hong.

## 2021-07-26 NOTE — Progress Notes (Addendum)
STROKE TEAM PROGRESS NOTE   INTERVAL HISTORY Patient seen in room alone.  Slight facial droop and slight dysarthria.  Slight sensory loss and weakness on the right side.  IV Cleviprex infusing.  Patient passed swallow screen, resume p.o. medications for hypertension.   Vitals:   07/26/21 0400 07/26/21 0500 07/26/21 0600 07/26/21 0700  BP: 124/79 (!) 142/79 139/86 132/83  Pulse: 99 96 86 95  Resp: 11 13 20 10   Temp: 99 F (37.2 C)     TempSrc: Oral     SpO2: 97% 98% 99% 95%  Weight:      Height:       CBC:  Recent Labs  Lab 07/25/21 0909 07/25/21 0911 07/26/21 0453  WBC 12.2*  --  12.2*  NEUTROABS 8.6*  --   --   HGB 15.0 15.0 14.0  HCT 43.4 44.0 41.1  MCV 87.7  --  86.5  PLT 235  --  354   Basic Metabolic Panel:  Recent Labs  Lab 07/25/21 0909 07/25/21 0911 07/25/21 1030 07/26/21 0453  NA 138 143  --  139  K 3.1* 3.2*  --  3.2*  CL 104 105  --  106  CO2 22  --   --  24  GLUCOSE 176* 173*  --  135*  BUN 16 17  --  22  CREATININE 1.14 0.90  --  1.13  CALCIUM 8.3*  --   --  8.5*  MG  --   --  1.3* 2.3  PHOS  --   --  2.8  --    Lipid Panel:  Recent Labs  Lab 07/25/21 1030  CHOL 123  TRIG 96  HDL 29*  CHOLHDL 4.2  VLDL 19  LDLCALC 75   HgbA1c:  Recent Labs  Lab 07/25/21 1030  HGBA1C 6.3*   Urine Drug Screen: No results for input(s): LABOPIA, COCAINSCRNUR, LABBENZ, AMPHETMU, THCU, LABBARB in the last 168 hours.  Alcohol Level  Recent Labs  Lab 07/25/21 1030  ETH <10    IMAGING past 24 hours Chest Port 1 View  Result Date: 07/25/2021 CLINICAL DATA:  Stroke, hemorrhagic. EXAM: PORTABLE CHEST 1 VIEW COMPARISON:  None. FINDINGS: 1015 hours. Two views submitted. The heart appears mildly enlarged. There is vascular congestion and mild bibasilar atelectasis, but no confluent airspace opacity, edema, significant pleural effusion or pneumothorax. Mild degenerative changes are present within the spine. No acute osseous findings are evident. Telemetry  leads overlie the chest. IMPRESSION: Cardiomegaly with mild vascular congestion and basilar atelectasis. Electronically Signed   By: Richardean Sale M.D.   On: 07/25/2021 10:26   ECHOCARDIOGRAM COMPLETE  Result Date: 07/25/2021    ECHOCARDIOGRAM REPORT   Patient Name:   Darius Ingram Date of Exam: 07/25/2021 Medical Rec #:  [id number]          Height:       72.0 in Accession #:    [id number]         Weight:       330.0 lb Date of Birth:  09-04-1955          BSA:          2.636 m Patient Age:    65 years           BP:           124/60 mmHg Patient Gender: M                  HR:  91 bpm. Exam Location:  Inpatient Procedure: 2D Echo, Cardiac Doppler and Color Doppler Indications:    Stroke I63.9  History:        Patient has prior history of Echocardiogram examinations, most                 recent 11/14/2020. Arrythmias:Atrial Fibrillation; Risk                 Factors:Hypertension, Diabetes and Dyslipidemia.  Sonographer:    Merrie Roof RDCS Referring Phys: [id number] [place]  1. Basal to mid inferior hypokinesis. Left ventricular ejection fraction, by estimation, is 55 to 60%. The left ventricle has normal function. The left ventricle demonstrates regional wall motion abnormalities (see scoring diagram/findings for description). There is moderate concentric left ventricular hypertrophy. Left ventricular diastolic parameters are consistent with Grade I diastolic dysfunction (impaired relaxation).  2. Right ventricular systolic function is normal. The right ventricular size is normal. There is normal pulmonary artery systolic pressure.  3. Left atrial size was mildly dilated.  4. The mitral valve is normal in structure. No evidence of mitral valve regurgitation. No evidence of mitral stenosis.  5. The aortic valve is tricuspid. Aortic valve regurgitation is not visualized. No aortic stenosis is present.  6. Aortic dilatation noted. There is mild dilatation of the ascending aorta,  measuring 40 mm.  7. The inferior vena cava is normal in size with greater than 50% respiratory variability, suggesting right atrial pressure of 3 mmHg. FINDINGS  Left Ventricle: Basal to mid inferior hypokinesis. Left ventricular ejection fraction, by estimation, is 55 to 60%. The left ventricle has normal function. The left ventricle demonstrates regional wall motion abnormalities. The left ventricular internal  cavity size was normal in size. There is moderate concentric left ventricular hypertrophy. Left ventricular diastolic parameters are consistent with Grade I diastolic dysfunction (impaired relaxation). Right Ventricle: The right ventricular size is normal. No increase in right ventricular wall thickness. Right ventricular systolic function is normal. There is normal pulmonary artery systolic pressure. The tricuspid regurgitant velocity is 1.95 m/s, and  with an assumed right atrial pressure of 3 mmHg, the estimated right ventricular systolic pressure is 83.1 mmHg. Left Atrium: Left atrial size was mildly dilated. Right Atrium: Right atrial size was normal in size. Pericardium: There is no evidence of pericardial effusion. Mitral Valve: The mitral valve is normal in structure. No evidence of mitral valve regurgitation. No evidence of mitral valve stenosis. Tricuspid Valve: The tricuspid valve is normal in structure. Tricuspid valve regurgitation is trivial. No evidence of tricuspid stenosis. Aortic Valve: The aortic valve is tricuspid. Aortic valve regurgitation is not visualized. No aortic stenosis is present. Aortic valve mean gradient measures 6.0 mmHg. Aortic valve peak gradient measures 11.4 mmHg. Aortic valve area, by VTI measures 2.87  cm. Pulmonic Valve: The pulmonic valve was normal in structure. Pulmonic valve regurgitation is not visualized. No evidence of pulmonic stenosis. Aorta: Aortic dilatation noted. There is mild dilatation of the ascending aorta, measuring 40 mm. Venous: The inferior  vena cava is normal in size with greater than 50% respiratory variability, suggesting right atrial pressure of 3 mmHg. IAS/Shunts: No atrial level shunt detected by color flow Doppler.  LEFT VENTRICLE PLAX 2D LVIDd:         5.13 cm LV PW:         1.53 cm LV IVS:        1.53 cm LVOT diam:     2.50 cm LV SV:  73 LV SV Index:   28 LVOT Area:     4.91 cm  RIGHT VENTRICLE RV Basal diam:  3.40 cm RV S prime:     12.10 cm/s TAPSE (M-mode): 1.6 cm LEFT ATRIUM           Index        RIGHT ATRIUM           Index LA diam:      3.60 cm 1.37 cm/m   RA Area:     18.00 cm LA Vol (A2C): 82.8 ml 31.41 ml/m  RA Volume:   46.30 ml  17.57 ml/m LA Vol (A4C): 90.0 ml 34.14 ml/m  AORTIC VALVE AV Area (Vmax):    2.79 cm AV Area (Vmean):   2.73 cm AV Area (VTI):     2.87 cm AV Vmax:           169.00 cm/s AV Vmean:          113.000 cm/s AV VTI:            0.255 m AV Peak Grad:      11.4 mmHg AV Mean Grad:      6.0 mmHg LVOT Vmax:         95.90 cm/s LVOT Vmean:        62.900 cm/s LVOT VTI:          0.149 m LVOT/AV VTI ratio: 0.58  AORTA Ao Root diam: 3.00 cm Ao Asc diam:  4.00 cm TRICUSPID VALVE TR Peak grad:   15.2 mmHg TR Vmax:        195.00 cm/s  SHUNTS Systemic VTI:  0.15 m Systemic Diam: 2.50 cm [name] MD Electronically signed by [name] MD Signature Date/Time: 07/25/2021/11:05:51 PM    Final    CT HEAD CODE STROKE WO CONTRAST  Result Date: 07/25/2021 CLINICAL DATA:  Code stroke.  Right-sided weakness EXAM: CT HEAD WITHOUT CONTRAST TECHNIQUE: Contiguous axial images were obtained from the base of the skull through the vertex without intravenous contrast. COMPARISON:  None. FINDINGS: Brain: There is an acute intraparenchymal hematoma centered in the left internal capsule measuring 1.5 cm AP by 1.4 cm TV by 1.8 cm cc. There is mild surrounding edema without significant mass effect. A small satellite hemorrhage is also seen in the distal left caudate body. There is no evidence of acute infarct or  extra-axial fluid collection. Background parenchymal volume is normal. The ventricles are normal in size. There is no solid mass lesion.  There is no midline shift. Vascular: No hyperdense vessel or unexpected calcification. Skull: Normal. Negative for fracture or focal lesion. Sinuses/Orbits: The paranasal sinuses are clear. The globes and orbits are unremarkable. Other: None. IMPRESSION: 1.8 cm acute intraparenchymal hematoma centered in the left internal capsule with mild surrounding edema but no significant mass effect, and smaller adjacent hematoma in the distal left caudate. These results were called by telephone at the time of interpretation on 07/25/2021 at 9:27 am to provider Dr. Theda Sers, who verbally acknowledged these results. Electronically Signed   By: Valetta Mole M.D.   On: 07/25/2021 09:27   CT ANGIO HEAD CODE STROKE  Result Date: 07/25/2021 CLINICAL DATA:  Intracranial hemorrhage EXAM: CT ANGIOGRAPHY HEAD TECHNIQUE: Multidetector CT imaging of the head was performed using the standard protocol during bolus administration of intravenous contrast. Multiplanar CT image reconstructions and MIPs were obtained to evaluate the vascular anatomy. CONTRAST:  56mL OMNIPAQUE IOHEXOL 350 MG/ML SOLN COMPARISON:  Same-day noncontrast CT head FINDINGS:  CTA HEAD Anterior circulation: There is minimal atherosclerotic plaque in the intracranial ICAs without hemodynamically significant stenosis or occlusion. The bilateral MCAs are patent with mild atherosclerotic irregularity of the distal branches. There is severe stenosis at the origin of the right A1 segment (8-93). The anterior cerebral arteries are otherwise patent. The anterior communicating artery is patent. The distal ACAs are primarily fed by the prominent left A2 segment. There is a tiny posteriorly directed outpouching arising from the anterior communicating artery which could reflect a small (1 mm) aneurysm (7-96). There is no arteriovenous  malformation. Posterior circulation: The bilateral V4 segments are patent. The basilar artery is patent. There is a fetal origin of the right PCA. The bilateral PCAs are patent with mild atherosclerotic irregularity of the distal branches There is no aneurysm or AVM. Venous sinuses: Not well evaluated due to contrast timing. Anatomic variants: As above. Other: There is no evidence of active contrast extravasation into the intraparenchymal hematoma. Review of the MIP images confirms the above findings. IMPRESSION: 1. No evidence of active extravasation into the intraparenchymal hematoma. 2. No evidence of AVM. 3. 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 4. 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 Otherwise, patent intracranial vasculature. Electronically Signed   By: Valetta Mole M.D.   On: 07/25/2021 09:45    PHYSICAL EXAM  Temp:  [98.1 F (36.7 C)-99.5 F (37.5 C)] 98.1 F (36.7 C) (12/30 0800) Pulse Rate:  [40-107] 84 (12/30 0800) Resp:  [0-27] 16 (12/30 0800) BP: (94-166)/(64-121) 145/108 (12/30 0800) SpO2:  [94 %-100 %] 100 % (12/30 0800)  General - Well nourished, well developed, in no apparent distress. Ophthalmologic - fundi not visualized due to noncooperation. Cardiovascular - Regular rhythm and rate.  Mental Status -  Level of arousal and orientation to time, place, and person were intact. Dysarthric, impaired articulation Recent and remote memory were intact. Fund of Knowledge was assessed and was intact.  Cranial Nerves II - XII - II - Visual field intact OU. III, IV, VI - Extraocular movements intact. V - Facial sensation intact bilaterally. VII -slight facial asymmetry, right droop VIII - Hearing & vestibular intact bilaterally. X - Palate elevates symmetrically. XI - Chin turning & shoulder shrug intact bilaterally. XII - Tongue protrusion  intact.  Motor Strength - The patients strength was normal in all extremities and pronator drift was absent.  Bulk was normal and fasciculations were absent.   RUE weak 0/5, able to move hand and fingers LUE- 5/5 RLE- 2/5 moves with gravity eliminated LLE-5/5 Motor Tone - Muscle tone was assessed at the neck and appendages and was normal. Reflexes - The patients reflexes were symmetrical in all extremities and he had no pathological reflexes. Sensory - diminished sensation in the right upper and lower extremities Coordination - The patient had normal movements in the hands and feet with no ataxia or dysmetria.  Tremor was absent. Gait and Station - deferred.   ASSESSMENT/PLAN [name] is a 65 y.o. male with history of DM2, HLD, HTN, Hypothyroidism, joint pain, and afib on elquis with no missed doses presenting after a fall with right side weakness, slurred speech, right facial weakness starting 12/29 at 0715.  Received andexxa to reverse eliquis coagulopathy.  Initial CT showed a 1.8 cm acute intraparenchymal hematoma at the left internal capsule.  MRI today showed a possible increase in the IPH and small  acute infarcts in the left frontal lobe.  LDL is 75 and hemoglobin A1c was 6.3.  Physical therapy recommends SNF and speech therapy recommends acute inpatient rehab.  Plan to resume home medications for hypertension in order to wean down Cleviprex and hopefully move out of the ICU tomorrow.  ICH vs. 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 CTA head no evidence  of active extravasation into the intraparenchymal hematoma, no evidence of AVM. 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 Severe stenosis at the origin of the right A1 segment and mild atherosclerotic irregularity of the distal MCA and PCA branches MRI - Acute left thalamocapsular IPH measuring up to approximately 2.0 cm. Small acute infarcts in the left frontal lobe. Carotid Doppler unremarkable 2D Echo EF 55-60%, LV normal function  LDL 75 HgbA1c 6.3 VTE prophylaxis - SCDs Eliquis (apixaban) daily prior to admission, now on No antithrombotic due to [place].  May consider resume anticoagulation once ICH resolves.  May consider Pradaxa to replace eliquis at that time given likely Eliquis failure. Therapy recommendations: SNF  Disposition:  Pending  Atrial fibrillation Previously on Eliquis 5 mg twice daily Hold off AC in the setting of hemorrhage Rate controlled with metoprolol 12.5 twice daily ->25mg  bid  Hypertension Home meds:  lisinopril,  amlodipine 10 mg Resume home medications Long-term BP goal normotensive BP goal now < 160 On Cleviprex IV, taper off as able On home lisinopril 20, amlodipine 10, increase metoprolol to 25 twice daily PRN labelotol  Hyperlipidemia Home meds:  atorvastatin 40mg  LDL 75, goal < 70 Hold in the setting of IPH Resume statin on discharge  Diabetes type II Controlled Home meds: Trulicity, metformin [id number] 6.3, goal < 7.0 CBGs SSI Close PCP follow-up as outpatient  Other Stroke Risk Factors Advanced Age >/= 47  Former Cigarette smoker, quit 22 years ago obesity, Body mass index is 44.76 kg/m., BMI >/= 30 associated with increased stroke risk, recommend weight loss, diet and exercise as appropriate   Other Active Problems Hypomagnesemia 1.4-> replaced with 4 mg IV Repeat 2.3 Check magnesium tomorrow at [facility] day # 1  Patient seen and examined by NP/APP with MD. MD to update note as needed.    Janine Ores, DNP, FNP-BC [facility] Pager: [phone]  ATTENDING NOTE: I reviewed above note and agree with the assessment and plan. Pt was seen and examined.   65 year old male with history of hypertension, hyperlipidemia, A. fib on Eliquis admitted for right-sided weakness, fell at home slurred speech and right facial droop.  CT showed left BG small ICH.  Status post Andexxa reversal.  CT head showed no AVM or aneurysm.  Repeat MRI stable left BG hematoma, but also showed left frontal scattered small infarcts.  EF 55 to 60%, carotid Doppler negative.  A1c 6.3, LDL 75, creatinine 0.9.  On exam, patient awake alert lying in bed, no aphasia, orientated x3, however moderate to severe dysarthria.  Able to name and repeat.  No gaze palsy, visual field fall, right facial droop.  Left upper extremity 5/5 and lower extremity 4/5.  Right upper  extremity proximal 0/5, finger movement 2/5.  Right lower extremity 2/5.  Sensation diminished on the right.  Left finger-to-nose intact.  Gait not tested.  Etiology for patient left BG hematoma not certain, could be spontaneous ICH in the setting of Eliquis use and hypertension, or could be due to hemorrhagic transformation in the setting of Eliquis failure given left frontal scattered infarcts.  Continue BP measurement, BP goal less than 160, taper off Cleviprex as able.  Resume home medication and increase metoprolol to 25 twice daily.  May consider anticoagulation once hematoma resolves, however, at that time may consider Pradaxa given potential Eliquis failure.  PT/OT recommend SNF.  Transfer out of ICU once Cleviprex off.  For detailed assessment and plan, please refer to above as I have made changes wherever appropriate.   Rosalin Hawking, MD PhD Stroke Neurology 07/26/2021 7:45 PM  This patient is critically ill due to [place], hypertensive emergency, embolic strokes, A. fib not on AC and at significant risk of neurological worsening, death  form recurrent stroke, hemorrhagic conversion, hypertensive encephalopathy, hematoma expansion, heart failure, seizure. This patient's care requires constant monitoring of vital signs, hemodynamics, respiratory and cardiac monitoring, review of multiple databases, neurological assessment, discussion with family, other specialists and medical decision making of high complexity. I spent 40 minutes of neurocritical care time in the care of this patient.    To contact Stroke Continuity provider, please refer to [email or web address] After hours, contact General Neurology

## 2021-07-26 NOTE — Progress Notes (Signed)
Notified MD of increasing bouts of afib and tachycardia, informed MD of patient reported history of Afib. MD placed new orders.   07/26/21 0400  Provider Notification  Provider Name/Title Cheral Marker, MD  Date Provider Notified 07/26/21  Time Provider Notified 0400  Notification Type Call  Notification Reason Other (Comment) ([place]ach[place] with bouts of afib)  Provider response See new orders  Date of Provider Response 07/26/21  Time of Provider Response [phone]

## 2021-07-27 ENCOUNTER — Encounter (HOSPITAL_COMMUNITY): Payer: Self-pay | Admitting: Neurology

## 2021-07-27 DIAGNOSIS — L8990 Pressure ulcer of unspecified site, unspecified stage: Secondary | ICD-10-CM | POA: Insufficient documentation

## 2021-07-27 LAB — CBC
HCT: 40.5 % (ref 39.0–52.0)
Hemoglobin: 13.9 g/dL (ref 13.0–17.0)
MCH: 30.2 pg (ref 26.0–34.0)
MCHC: 34.3 g/dL (ref 30.0–36.0)
MCV: 87.9 fL (ref 80.0–100.0)
Platelets: 236 10*3/uL (ref 150–400)
RBC: 4.61 MIL/uL (ref 4.22–5.81)
RDW: 14.5 % (ref 11.5–15.5)
WBC: 12 10*3/uL — ABNORMAL HIGH (ref 4.0–10.5)
nRBC: 0 % (ref 0.0–0.2)

## 2021-07-27 LAB — GLUCOSE, CAPILLARY
Glucose-Capillary: 120 mg/dL — ABNORMAL HIGH (ref 70–99)
Glucose-Capillary: 146 mg/dL — ABNORMAL HIGH (ref 70–99)
Glucose-Capillary: 125 mg/dL — ABNORMAL HIGH (ref 70–99)
Glucose-Capillary: 156 mg/dL — ABNORMAL HIGH (ref 70–99)

## 2021-07-27 LAB — BASIC METABOLIC PANEL
Anion gap: 10 (ref 5–15)
BUN: 24 mg/dL — ABNORMAL HIGH (ref 8–23)
CO2: 24 mmol/L (ref 22–32)
Calcium: 8.8 mg/dL — ABNORMAL LOW (ref 8.9–10.3)
Chloride: 106 mmol/L (ref 98–111)
Creatinine, Ser: 1.17 mg/dL (ref 0.61–1.24)
GFR, Estimated: >60 mL/min (ref >60)
Glucose, Bld: 132 mg/dL — ABNORMAL HIGH (ref 70–99)
Potassium: 3.5 mmol/L (ref 3.5–5.1)
Sodium: 140 mmol/L (ref 135–145)

## 2021-07-27 LAB — MAGNESIUM: Magnesium: 1.8 mg/dL (ref 1.7–2.4)

## 2021-07-27 NOTE — Progress Notes (Signed)
Report called to 3W all questions answered and no concerns noted at this time.

## 2021-07-27 NOTE — Plan of Care (Signed)
?  Problem: Education: ?Goal: Knowledge of disease or condition will improve ?Outcome: Progressing ?Goal: Knowledge of secondary prevention will improve (SELECT ALL) ?Outcome: Progressing ?Goal: Knowledge of patient specific risk factors will improve (INDIVIDUALIZE FOR PATIENT) ?Outcome: Progressing ?  ?

## 2021-07-27 NOTE — Consult Note (Signed)
Initial Consultation Note   Patient: Darius Ingram XHB:[id number] DOB: [birth year] PCP: Horald Pollen, MD DOA: 07/25/2021 DOS: the patient was seen and examined on 07/27/2021 Primary service: Stroke, Md, MD  Referring physician: Stroke team Reason for consult: 65 y.o. male with history of DM2, HLD, HTN, Hypothyroidism, joint pain, and afib on elquis with no missed doses presenting after a fall with right side weakness, slurred speech, right facial weakness starting 12/29 at 0715. Received andexxa to reverse eliquis coagulopathy. Initial CT showed a 1.8 cm acute intraparenchymal hematoma at the left internal capsule. MRI today showed a possible increase in the IPH and small acute infarcts in the left frontal lobe. LDL is 75 and hemoglobin A1c was 6.3. Physical therapy recommends SNF and speech therapy recommends acute inpatient rehab. He has been off cleviprex since yesterday, restarted home BP medications and plan for another scan tomorrow morning just to check on the hem.  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 Embolic CVA with hemorrhagic conversion associated with Eliquis failure -He was treated with Andexxa for [facility] reversal -Improving neurologic symptoms but likely to need SNF for rehab - he lives alone -Will assume care once patient transfers out of ICU; neurology will remain primary until then -Neurology will continue to follow and will manage this issue  Pressure injury of skin -Stage 1 sacral pressure ulcer appreciated on admission -Needs monitoring  Atrial fibrillation ([place])- (present on admission) -Rate controlled with Lopressor, which has been resumed -Concern for Eliquis failure -When ok to resume AC per neurology, they recommend Pradaxa  Dyslipidemia (high LDL; low HDL)- (present on admission) -LDL was 75, which is not significantly elevated -Continue Lipitor  Body mass index (BMI) of 45.0-49.9 in  adult ([place]) -Body mass index is 44.76 kg/m..  -Weight loss should be encouraged -Outpatient PCP/bariatric medicine/bariatric surgery f/u encouraged  Hypertension associated with diabetes ([place])- (present on admission) -Continue Lopressor, Lisinopril, Norvasc  Uncontrolled type 2 diabetes mellitus with hyperglycemia ([place])- (present on admission) -A1c is 6.3, indicating good control -hold Glucophage, Trulicity for now -Cover with resistant-scale SSI     TRH will continue to follow the patient.  We will assume care upon transfer out of the ICU with neurology continuing to follow.  HPI: Darius Ingram is a 65 y.o. male with past medical history of afib on Eliquis; DM; HTN; HLD; and hypothyroidism who presented on 12/29 with stroke-like symptoms.  He was found to have an acute hemorrhagic stroke and was given Andexxa to reverse the coagulopathy.  He was admitted to the neuro ICU with a nicardipine drip.  He has had improvement in neurologic symptoms.  He is fitted for nocturnal CPAP and is planning to transfer out of the ICU today.  He reports that he is swallowing well.  He thinks his speech is improved.  He continues to have R facial and body numbness and R hemiplegia.   Review of Systems: As mentioned in the history of present illness. All other systems reviewed and are negative. Past Medical History:  Diagnosis Date   Allergy    seasonal allergies   Atrial fibrillation ([place])    Diabetes mellitus without complication ([place])    Hyperlipidemia    Hypertension    Hypothyroidism    Joint pain    SOBOE (shortness of breath on exertion)    Past Surgical History:  Procedure Laterality Date   CARDIOVERSION N/A 01/09/2021   Procedure: CARDIOVERSION;  Surgeon: [name], MD;  Location: [facility];  Service: Cardiovascular;  Laterality: N/A;   TONSILECTOMY, ADENOIDECTOMY, BILATERAL MYRINGOTOMY [place] EXTRACTION  1978   Social  History:  reports that he quit smoking about 22 years ago. His smoking use included cigarettes. He has a 10.00 pack-year smoking history. He has never used smokeless tobacco. He reports current alcohol use of about 1.0 standard drink per week. He reports that he does not use drugs.  Allergies  Allergen Reactions   Penicillins Rash    Reaction: 79 years ago    Family History  Problem Relation Age of Onset   Diabetes Mother    Obesity Mother    Diabetes Father    Hypertension Father    Heart disease Father    Obesity Father    Colon polyps Neg Hx    Colon cancer Neg Hx    Esophageal cancer Neg Hx    Stomach cancer Neg Hx    Rectal cancer Neg Hx     Prior to Admission medications   Medication Sig Start Date End Date Taking? Authorizing Provider  acetaminophen (TYLENOL) 650 MG CR tablet Take 650 mg by mouth daily.   Yes [provider]  amLODipine (NORVASC) 10 MG tablet Take 1 tablet (10 mg total) by mouth daily. 10/02/20  Yes Sagardia, Ines Bloomer, MD  apixaban (ELIQUIS) 5 MG TABS tablet Take 1 tablet (5 mg total) by mouth 2 (two) times daily. 03/14/21  Yes Fenton, Clint R, PA  atorvastatin (LIPITOR) 40 MG tablet Take 1 tablet (40 mg total) by mouth daily. Patient taking differently: Take 40 mg by mouth daily. 01/09/21  Yes [name], MD  calcium carbonate (TUMS EX) 750 MG chewable tablet Chew 750-1,500 mg by mouth daily as needed for heartburn.   Yes [provider]  cetirizine (ZYRTEC) 10 MG tablet Take 1 tablet (10 mg total) by mouth daily. 01/21/18  Yes Jaynee Eagles, PA-C  Dulaglutide (TRULICITY) 1.5 EV/0.3JK SOPN Inject 1.5 mg into the skin once a week. 01/17/21  Yes Sagardia, Ines Bloomer, MD  fluticasone [facility]) 50 MCG/ACT nasal spray Place 2 sprays into both nostrils daily. Patient taking differently: Place 2 sprays into both nostrils daily as needed for allergies. 01/09/21  Yes [name], MD  lisinopril (ZESTRIL) 20 MG tablet Take 1 tablet (20 mg  total) by mouth daily. Patient taking differently: Take 20 mg by mouth daily. 01/09/21  Yes [name], MD  metFORMIN (GLUCOPHAGE) 1000 MG tablet Take 1 tablet (1,000 mg total) by mouth 2 (two) times daily with a meal. 10/02/20 07/25/21 Yes Sagardia, Ines Bloomer, MD  Multiple Vitamin (MULTIVITAMIN PO) Take 1 tablet by mouth daily.   Yes [provider]    Physical Exam: General:  Appears calm and comfortable and is in NAD Eyes:  EOMI, normal lids, iris ENT:  grossly normal hearing, lips & tongue, mmm Neck:  no LAD, masses or thyromegaly Cardiovascular:  RR with tachycardia, no m/r/g. No LE edema.  Respiratory:   CTA bilaterally with no wheezes/rales/rhonchi.  Mildly increased respiratory effort. Abdomen:  soft, NT, ND Skin:  no rash or induration seen on limited exam Musculoskeletal:  R hemiparesis Psychiatric:  grossly normal mood and affect, speech fluent but mildly dysarthric, AOx3 Neurologic:  CN 2-12 grossly intact other than subtle R facial droop, R hemiparesis   Radiological Exams on Admission: Independently reviewed - see discussion in A/P where applicable  MR BRAIN WO CONTRAST  Result Date: 07/26/2021 CLINICAL DATA:  Stroke, follow up Hemorrhagic stroke EXAM: MRI HEAD WITHOUT CONTRAST TECHNIQUE: Multiplanar, multiecho pulse sequences of the brain and surrounding structures were obtained without intravenous contrast. COMPARISON:  CT head 07/25/2021 FINDINGS: Brain: Acute left thalamocapsular intraparenchymal hemorrhage measuring up to approximately 2.0 cm, similar versus slightly increased in size relative to recent CT head when comparing across modalities. Mild surrounding edema. There is some surrounding diffusion signal abnormality; however, this could be related to the hemorrhage. No evidence of a mass; however, acute blood products limit evaluation. Many small acute infarcts in the left frontal lobe. Additional scattered T2/FLAIR hyperintensities in the white  matter, nonspecific but compatible with chronic microvascular ischemic disease. Small remote infarct in the right thalamus. No hydrocephalus, midline shift, or extra-axial fluid collection. Vascular: Major arterial flow voids are maintained at the skull base. Skull and upper cervical spine: Normal marrow signal. Sinuses/Orbits: Mild paranasal sinus mucosal thickening. Unremarkable orbits. Other: No mastoid effusions. IMPRESSION: 1. Acute left thalamocapsular intraparenchymal hemorrhage measuring up to approximately 2.0 cm, similar versus slightly increased in size relative to recent CT head when comparing across modalities. 2. Many small acute infarcts in the left frontal lobe. Electronically Signed   By: Margaretha Sheffield M.D.   On: 07/26/2021 13:22   ECHOCARDIOGRAM COMPLETE  Result Date: 07/25/2021    ECHOCARDIOGRAM REPORT   Patient Name:   Darius Ingram Date of Exam: 07/25/2021 Medical Rec #:  [id number]          Height:       72.0 in Accession #:    [id number]         Weight:       330.0 lb Date of Birth:  June 06, 1956          BSA:          2.636 m Patient Age:    29 years           BP:           124/60 mmHg Patient Gender: M                  HR:           91 bpm. Exam Location:  Inpatient Procedure: 2D Echo, Cardiac Doppler and Color Doppler Indications:    Stroke I63.9  History:        Patient has prior history of Echocardiogram examinations, most                 recent 11/14/2020. Arrythmias:Atrial Fibrillation; Risk                 Factors:Hypertension, Diabetes and Dyslipidemia.  Sonographer:    Merrie Roof RDCS Referring Phys: [id number] [place]  1. Basal to mid inferior hypokinesis. Left ventricular ejection fraction, by estimation, is 55 to 60%. The left ventricle has normal function. The left ventricle demonstrates regional wall motion abnormalities (see scoring diagram/findings for description). There is moderate concentric left ventricular hypertrophy. Left ventricular  diastolic parameters are consistent with Grade I diastolic dysfunction (impaired relaxation).  2. Right ventricular systolic function is normal. The right ventricular size is normal. There is normal pulmonary artery systolic pressure.  3. Left atrial size was mildly dilated.  4. The mitral valve is normal in structure. No evidence of mitral valve regurgitation. No evidence of mitral stenosis.  5. The aortic valve is tricuspid. Aortic valve regurgitation is not visualized. No aortic stenosis is present.  6. Aortic dilatation noted. There is mild dilatation of the ascending aorta,  measuring 40 mm.  7. The inferior vena cava is normal in size with greater than 50% respiratory variability, suggesting right atrial pressure of 3 mmHg. FINDINGS  Left Ventricle: Basal to mid inferior hypokinesis. Left ventricular ejection fraction, by estimation, is 55 to 60%. The left ventricle has normal function. The left ventricle demonstrates regional wall motion abnormalities. The left ventricular internal  cavity size was normal in size. There is moderate concentric left ventricular hypertrophy. Left ventricular diastolic parameters are consistent with Grade I diastolic dysfunction (impaired relaxation). Right Ventricle: The right ventricular size is normal. No increase in right ventricular wall thickness. Right ventricular systolic function is normal. There is normal pulmonary artery systolic pressure. The tricuspid regurgitant velocity is 1.95 m/s, and  with an assumed right atrial pressure of 3 mmHg, the estimated right ventricular systolic pressure is 62.9 mmHg. Left Atrium: Left atrial size was mildly dilated. Right Atrium: Right atrial size was normal in size. Pericardium: There is no evidence of pericardial effusion. Mitral Valve: The mitral valve is normal in structure. No evidence of mitral valve regurgitation. No evidence of mitral valve stenosis. Tricuspid Valve: The tricuspid valve is normal in structure. Tricuspid valve  regurgitation is trivial. No evidence of tricuspid stenosis. Aortic Valve: The aortic valve is tricuspid. Aortic valve regurgitation is not visualized. No aortic stenosis is present. Aortic valve mean gradient measures 6.0 mmHg. Aortic valve peak gradient measures 11.4 mmHg. Aortic valve area, by VTI measures 2.87  cm. Pulmonic Valve: The pulmonic valve was normal in structure. Pulmonic valve regurgitation is not visualized. No evidence of pulmonic stenosis. Aorta: Aortic dilatation noted. There is mild dilatation of the ascending aorta, measuring 40 mm. Venous: The inferior vena cava is normal in size with greater than 50% respiratory variability, suggesting right atrial pressure of 3 mmHg. IAS/Shunts: No atrial level shunt detected by color flow Doppler.  LEFT VENTRICLE PLAX 2D LVIDd:         5.13 cm LV PW:         1.53 cm LV IVS:        1.53 cm LVOT diam:     2.50 cm LV SV:         73 LV SV Index:   28 LVOT Area:     4.91 cm  RIGHT VENTRICLE RV Basal diam:  3.40 cm RV S prime:     12.10 cm/s TAPSE (M-mode): 1.6 cm LEFT ATRIUM           Index        RIGHT ATRIUM           Index LA diam:      3.60 cm 1.37 cm/m   RA Area:     18.00 cm LA Vol (A2C): 82.8 ml 31.41 ml/m  RA Volume:   46.30 ml  17.57 ml/m LA Vol (A4C): 90.0 ml 34.14 ml/m  AORTIC VALVE AV Area (Vmax):    2.79 cm AV Area (Vmean):   2.73 cm AV Area (VTI):     2.87 cm AV Vmax:           169.00 cm/s AV Vmean:          113.000 cm/s AV VTI:            0.255 m AV Peak Grad:      11.4 mmHg AV Mean Grad:      6.0 mmHg LVOT Vmax:         95.90 cm/s LVOT Vmean:  62.900 cm/s LVOT VTI:          0.149 m LVOT/AV VTI ratio: 0.58  AORTA Ao Root diam: 3.00 cm Ao Asc diam:  4.00 cm TRICUSPID VALVE TR Peak grad:   15.2 mmHg TR Vmax:        195.00 cm/s  SHUNTS Systemic VTI:  0.15 m Systemic Diam: 2.50 cm [name] MD Electronically signed by [name] MD Signature Date/Time: 07/25/2021/11:05:51 PM    Final    VAS US CAROTID  Result Date:  07/26/2021 Carotid Arterial Duplex Study Patient Name:  Darius Ingram  Date of Exam:   07/26/2021 Medical Rec #: [id number]           Accession #:    [id number] Date of Birth: 1956/01/27           Patient Gender: M Patient Age:   88 years Exam Location:  [facility] Procedure:      VAS US CAROTID Referring Phys: Rosalin Hawking --------------------------------------------------------------------------------  Indications:       CVA. Risk Factors:      Hypertension, hyperlipidemia, Diabetes. Comparison Study:  No prior study Performing Technologist: Maudry Mayhew MHA, RDMS, RVT, RDCS  Examination Guidelines: A complete evaluation includes B-mode imaging, spectral Doppler, color Doppler, and power Doppler as needed of all accessible portions of each vessel. Bilateral testing is considered an integral part of a complete examination. Limited examinations for reoccurring indications may be performed as noted.  Right Carotid Findings: +----------+--------+--------+--------+-----------------------+--------+             PSV cm/s EDV cm/s Stenosis Plaque Description      Comments  +----------+--------+--------+--------+-----------------------+--------+  CCA Prox   49       7                                                   +----------+--------+--------+--------+-----------------------+--------+  CCA Distal 49       9                 smooth and heterogenous           +----------+--------+--------+--------+-----------------------+--------+  ICA Prox   33       12                                                  +----------+--------+--------+--------+-----------------------+--------+  ICA Distal 48       19                                                  +----------+--------+--------+--------+-----------------------+--------+  ECA        81       6                                                   +----------+--------+--------+--------+-----------------------+--------+  +----------+--------+-------+----------------+-------------------+             PSV cm/s EDV cms Describe  Arm Pressure (mmHG)  +----------+--------+-------+----------------+-------------------+  Subclavian 167              Multiphasic, WNL                      +----------+--------+-------+----------------+-------------------+ +---------+--------+--+--------+-+---------+  Vertebral PSV cm/s 33 EDV cm/s 9 Antegrade  +---------+--------+--+--------+-+---------+  Left Carotid Findings: +----------+--------+--------+--------+--------------------------+--------+             PSV cm/s EDV cm/s Stenosis Plaque Description         Comments  +----------+--------+--------+--------+--------------------------+--------+  CCA Prox   81       13                                                     +----------+--------+--------+--------+--------------------------+--------+  CCA Distal 44       15                heterogenous and irregular           +----------+--------+--------+--------+--------------------------+--------+  ICA Prox   37       9                 smooth and heterogenous              +----------+--------+--------+--------+--------------------------+--------+  ICA Distal 76       28                                                     +----------+--------+--------+--------+--------------------------+--------+  ECA        89       7                                                      +----------+--------+--------+--------+--------------------------+--------+ +----------+--------+--------+----------------+-------------------+             PSV cm/s EDV cm/s Describe         Arm Pressure (mmHG)  +----------+--------+--------+----------------+-------------------+  Subclavian 106               Multiphasic, WNL                      +----------+--------+--------+----------------+-------------------+ +---------+--------+--+--------+--+---------+  Vertebral PSV cm/s 49 EDV cm/s 15 Antegrade   +---------+--------+--+--------+--+---------+   Summary: Right Carotid: Velocities in the right ICA are consistent with a 1-39% stenosis. Left Carotid: Velocities in the left ICA are consistent with a 1-39% stenosis. Vertebrals:  Bilateral vertebral arteries demonstrate antegrade flow. Subclavians: Normal flow hemodynamics were seen in bilateral subclavian              arteries. *See table(s) above for measurements and observations.  Electronically signed by Rosalin Hawking MD on 07/26/2021 at 6:25:31 PM.    Final       Pertinent Labs: I have personally reviewed the available labs and imaging studies at the time of the admission.  Glucose 132 WBC 12.0 COVID/flu negative   Family Communication: I called to speak with his sister at the time of consultation.  They spoke with Dr. Pearline Cables  at the time of admission.  She spoke with his nurse and the patient yesterday.  Primary team communication: Secure Chat discussion this AM - will assume care when patient is transferred out of the ICU  Thank you very much for involving [place] in the care of your patient.  Author: Karmen Bongo 07/27/2021 12:09 PM  For on call review [email or web address].

## 2021-07-27 NOTE — Assessment & Plan Note (Signed)
-  A1c is 6.3, indicating good control -hold Glucophage, Trulicity for now -Cover with resistant-scale SSI

## 2021-07-27 NOTE — Assessment & Plan Note (Signed)
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 Embolic CVA with hemorrhagic conversion associated with Eliquis failure -He was treated with Andexxa for [facility] reversal -Improving neurologic symptoms but likely to need SNF for rehab - he lives alone -Will assume care once patient transfers out of ICU; neurology will remain primary until then -Neurology will continue to follow and will manage this issue

## 2021-07-27 NOTE — Progress Notes (Signed)
Patient placed on CPAP on Auto titrate pressures of 16 cmH2O- 6 cmH2O via nasal mask with a 6 L O2 bleed in; patient is tolerating at this time.  RN made aware.

## 2021-07-27 NOTE — Assessment & Plan Note (Signed)
-  Stage 1 sacral pressure ulcer appreciated on admission -Needs monitoring

## 2021-07-27 NOTE — Progress Notes (Addendum)
STROKE TEAM PROGRESS NOTE   INTERVAL HISTORY Patient seen in room alone.  Slight facial droop and slight dysarthria.  Slight sensory loss and weakness on the right side. Cleviprex off since yesterday. Patient fitted for CPAP for at night. Move out of ICU today and transfer to hospitalist service.   Vitals:   07/27/21 0600 07/27/21 0700 07/27/21 0800 07/27/21 0925  BP: (!) 139/127 131/73 124/76 120/66  Pulse: (!) 118 (!) 112 79 (!) 101  Resp: (!) 31 20 (!) 22   Temp:   98.5 F (36.9 C)   TempSrc:   Oral   SpO2: 95% 93% 94%   Weight:      Height:       CBC:  Recent Labs  Lab 07/25/21 0909 07/25/21 0911 07/26/21 0453 07/27/21 0333  WBC 12.2*  --  12.2* 12.0*  NEUTROABS 8.6*  --   --   --   HGB 15.0   < > 14.0 13.9  HCT 43.4   < > 41.1 40.5  MCV 87.7  --  86.5 87.9  PLT 235  --  262 236   < > = values in this interval not displayed.    Basic Metabolic Panel:  Recent Labs  Lab 07/25/21 1030 07/26/21 0453 07/27/21 0333  NA  --  139 140  K  --  3.2* 3.5  CL  --  106 106  CO2  --  24 24  GLUCOSE  --  135* 132*  BUN  --  22 24*  CREATININE  --  1.13 1.17  CALCIUM  --  8.5* 8.8*  MG 1.3* 2.3 1.8  PHOS 2.8  --   --     Lipid Panel:  Recent Labs  Lab 07/25/21 1030  CHOL 123  TRIG 96  HDL 29*  CHOLHDL 4.2  VLDL 19  LDLCALC 75    HgbA1c:  Recent Labs  Lab 07/25/21 1030  HGBA1C 6.3*    Urine Drug Screen: No results for input(s): LABOPIA, COCAINSCRNUR, LABBENZ, AMPHETMU, THCU, LABBARB in the last 168 hours.  Alcohol Level  Recent Labs  Lab 07/25/21 1030  ETH <10     IMAGING past 24 hours MR BRAIN WO CONTRAST  Result Date: 07/26/2021 CLINICAL DATA:  Stroke, follow up Hemorrhagic stroke EXAM: MRI HEAD WITHOUT CONTRAST TECHNIQUE: Multiplanar, multiecho pulse sequences of the brain and surrounding structures were obtained without intravenous contrast. COMPARISON:  CT head 07/25/2021 FINDINGS: Brain: Acute left thalamocapsular intraparenchymal hemorrhage  measuring up to approximately 2.0 cm, similar versus slightly increased in size relative to recent CT head when comparing across modalities. Mild surrounding edema. There is some surrounding diffusion signal abnormality; however, this could be related to the hemorrhage. No evidence of a mass; however, acute blood products limit evaluation. Many small acute infarcts in the left frontal lobe. Additional scattered T2/FLAIR hyperintensities in the white matter, nonspecific but compatible with chronic microvascular ischemic disease. Small remote infarct in the right thalamus. No hydrocephalus, midline shift, or extra-axial fluid collection. Vascular: Major arterial flow voids are maintained at the skull base. Skull and upper cervical spine: Normal marrow signal. Sinuses/Orbits: Mild paranasal sinus mucosal thickening. Unremarkable orbits. Other: No mastoid effusions. IMPRESSION: 1. Acute left thalamocapsular intraparenchymal hemorrhage measuring up to approximately 2.0 cm, similar versus slightly increased in size relative to recent CT head when comparing across modalities. 2. Many small acute infarcts in the left frontal lobe. Electronically Signed   By: Margaretha Sheffield M.D.   On: 07/26/2021 13:22  VAS US CAROTID  Result Date: 07/26/2021 Carotid Arterial Duplex Study Patient Name:  Darius Ingram  Date of Exam:   07/26/2021 Medical Rec #: [id number]           Accession #:    [id number] Date of Birth: 1956-06-20           Patient Gender: M Patient Age:   65 years Exam Location:  [facility] Procedure:      VAS US CAROTID Referring Phys: Cornelius Moras XU --------------------------------------------------------------------------------  Indications:       CVA. Risk Factors:      Hypertension, hyperlipidemia, Diabetes. Comparison Study:  No prior study Performing Technologist: Maudry Mayhew MHA, RDMS, RVT, RDCS  Examination Guidelines: A complete evaluation includes B-mode imaging, spectral Doppler, color  Doppler, and power Doppler as needed of all accessible portions of each vessel. Bilateral testing is considered an integral part of a complete examination. Limited examinations for reoccurring indications may be performed as noted.  Right Carotid Findings: +----------+--------+--------+--------+-----------------------+--------+             PSV cm/s EDV cm/s Stenosis Plaque Description      Comments  +----------+--------+--------+--------+-----------------------+--------+  CCA Prox   49       7                                                   +----------+--------+--------+--------+-----------------------+--------+  CCA Distal 49       9                 smooth and heterogenous           +----------+--------+--------+--------+-----------------------+--------+  ICA Prox   33       12                                                  +----------+--------+--------+--------+-----------------------+--------+  ICA Distal 48       19                                                  +----------+--------+--------+--------+-----------------------+--------+  ECA        81       6                                                   +----------+--------+--------+--------+-----------------------+--------+ +----------+--------+-------+----------------+-------------------+             PSV cm/s EDV cms Describe         Arm Pressure (mmHG)  +----------+--------+-------+----------------+-------------------+  Subclavian 167              Multiphasic, WNL                      +----------+--------+-------+----------------+-------------------+ +---------+--------+--+--------+-+---------+  Vertebral PSV cm/s 33 EDV cm/s 9 Antegrade  +---------+--------+--+--------+-+---------+  Left Carotid Findings: +----------+--------+--------+--------+--------------------------+--------+             PSV cm/s EDV cm/s Stenosis  Plaque Description         Comments  +----------+--------+--------+--------+--------------------------+--------+  CCA Prox   81        13                                                     +----------+--------+--------+--------+--------------------------+--------+  CCA Distal 44       15                heterogenous and irregular           +----------+--------+--------+--------+--------------------------+--------+  ICA Prox   37       9                 smooth and heterogenous              +----------+--------+--------+--------+--------------------------+--------+  ICA Distal 76       28                                                     +----------+--------+--------+--------+--------------------------+--------+  ECA        89       7                                                      +----------+--------+--------+--------+--------------------------+--------+ +----------+--------+--------+----------------+-------------------+             PSV cm/s EDV cm/s Describe         Arm Pressure (mmHG)  +----------+--------+--------+----------------+-------------------+  Subclavian 106               Multiphasic, WNL                      +----------+--------+--------+----------------+-------------------+ +---------+--------+--+--------+--+---------+  Vertebral PSV cm/s 49 EDV cm/s 15 Antegrade  +---------+--------+--+--------+--+---------+   Summary: Right Carotid: Velocities in the right ICA are consistent with a 1-39% stenosis. Left Carotid: Velocities in the left ICA are consistent with a 1-39% stenosis. Vertebrals:  Bilateral vertebral arteries demonstrate antegrade flow. Subclavians: Normal flow hemodynamics were seen in bilateral subclavian              arteries. *See table(s) above for measurements and observations.  Electronically signed by Rosalin Hawking MD on 07/26/2021 at 6:25:31 PM.    Final     PHYSICAL EXAM  Temp:  [98.2 F (36.8 C)-99.5 F (37.5 C)] 98.5 F (36.9 C) (12/31 0800) Pulse Rate:  [73-120] 101 (12/31 0925) Resp:  [0-31] 22 (12/31 0800) BP: (108-166)/(66-135) 120/66 (12/31 0925) SpO2:  [87 %-100 %] 94 % (12/31  0800)  General - Well nourished, well developed, in no apparent distress. Ophthalmologic - fundi not visualized due to noncooperation. Cardiovascular - Regular rhythm and rate.  Mental Status -  Level of arousal and orientation to time, place, and person were intact. Dysarthric, impaired articulation   Cranial Nerves II - XII - II - Visual field intact OU. III, IV, VI - Extraocular movements intact. V - Facial sensation intact bilaterally. VII -slight facial asymmetry, right droop VIII - Hearing &  vestibular intact bilaterally. X - Palate elevates symmetrically. XI - Chin turning & shoulder shrug intact bilaterally. XII - Tongue protrusion intact.  Motor Strength - The patients strength was normal in all extremities and pronator drift was absent.  Bulk was normal and fasciculations were absent.   RUE weak 0/5, able to move hand and fingers 2/5 LUE- 5/5 RLE- 2/5 moves with gravity eliminated LLE-4/5 Motor Tone - Muscle tone was assessed at the neck and appendages and was normal. Reflexes - The patients reflexes were symmetrical in all extremities and he had no pathological reflexes. Sensory - diminished sensation in the right upper and lower extremities Coordination - The patient had normal movements in the hands and feet with no ataxia or dysmetria.  Tremor was absent. Gait and Station - deferred.   ASSESSMENT/PLAN Mr. Earlin Sweeden is a 65 y.o. male with history of DM2, HLD, HTN, Hypothyroidism, joint pain, and afib on elquis with no missed doses presenting after a fall with right side weakness, slurred speech, right facial weakness starting 12/29 at 0715.  Received andexxa to reverse eliquis coagulopathy.  Initial CT showed a 1.8 cm acute intraparenchymal hematoma at the left internal capsule.  MRI today showed a possible increase in the IPH and small acute infarcts in the left frontal lobe.  LDL is 75 and hemoglobin A1c was 6.3.  Physical therapy recommends SNF and speech  therapy recommends acute inpatient rehab.  Home HTN medications resumed and cleviprex is off. Plan to on board hospitalist and move out of ICU today. Transfer orders placed.   ICH vs. 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 CTA head no evidence of active extravasation into the intraparenchymal hematoma, no evidence of AVM. 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 Severe stenosis at the origin of the right A1 segment and mild atherosclerotic irregularity of the distal MCA and PCA branches MRI - Acute left thalamocapsular IPH measuring up to approximately 2.0 cm. Small acute infarcts in the left frontal lobe. Carotid Doppler unremarkable 2D Echo EF 55-60%, LV normal function  LDL 75 HgbA1c 6.3 VTE prophylaxis - SCDs Eliquis (apixaban) daily prior to admission, now on No antithrombotic due to [place].  May consider resume anticoagulation once ICH resolves.  May consider Pradaxa to replace eliquis at that time given likely Eliquis failure. Therapy recommendations: SNF  Disposition:  Pending  Atrial fibrillation Previously on Eliquis 5 mg twice daily Hold off AC in the setting of hemorrhage Rate controlled with metoprolol 12.5 twice daily ->25mg  bid May consider anticoagulation once hematoma resolves, however, at that time may consider Pradaxa given potential Eliquis failure.  Hypertension Home meds:  lisinopril,  amlodipine 10 mg Resume home medications Long-term BP goal normotensive BP goal now < 160 On Cleviprex IV, taper off as able On  home lisinopril 20, amlodipine 10, increase metoprolol to 25 twice daily PRN labelotol  Hyperlipidemia Home meds:  atorvastatin 40mg  LDL 75, goal < 70 Hold in the setting of IPH Resume statin on discharge  Diabetes type II Controlled Home meds: Trulicity, metformin OMBT5H 6.3, goal < 7.0 CBGs SSI Close  PCP follow-up as outpatient  Other Stroke Risk Factors Advanced Age >/= 38  Former Cigarette smoker, quit 22 years ago obesity, Body mass index is 44.76 kg/m., BMI >/= 30 associated with increased stroke risk, recommend weight loss, diet and exercise as appropriate   Other Active Problems Hypomagnesemia 1.4-> replaced with 4 mg IV Repeat 2.3 ->1.8  Hospital day # 2  Patient seen and examined by NP/APP with MD. MD to update note as needed.   Janine Ores, DNP, FNP-BC [facility] Pager: [phone]  ATTENDING ATTESTATION:  [facility] tomorrow to ensure ICH stable. Transfer out of ICU. Con't to monitor BP and can change to q2 hrs neuro check.  Dr. Reeves Forth evaluated pt independently, reviewed imaging, chart, labs. Discussed and formulated plan with the APP. Please see APP note above for details.   Total 30 minutes spent on counseling patient and coordinating care, writing notes and reviewing chart.   Francys Bolin,MD    To contact Stroke Continuity provider, please refer to [email or web address] After hours, contact General Neurology

## 2021-07-27 NOTE — Assessment & Plan Note (Signed)
-  Body mass index is 44.76 kg/m..  -Weight loss should be encouraged -Outpatient PCP/bariatric medicine/bariatric surgery f/u encouraged

## 2021-07-27 NOTE — Assessment & Plan Note (Signed)
-  Continue Lopressor, Lisinopril, Norvasc

## 2021-07-27 NOTE — Assessment & Plan Note (Signed)
-  Rate controlled with Lopressor, which has been resumed -Concern for Eliquis failure -When ok to resume AC per neurology, they recommend Pradaxa

## 2021-07-27 NOTE — TOC Initial Note (Addendum)
Transition of Care [facility]) - Initial/Assessment Note    Patient Details  Name: Darius Ingram MRN: [id number] Date of Birth: 01-02-1956  Transition of [facility]) CM/SW Contact:    Alfredia Ferguson, LCSW Phone Number: 07/27/2021, 12:15 PM  Clinical Narrative:                 CSW met with patient to follow-up on PT recommendation for SNF. CSW met with patient and introduced herself and role. Patient verbalized understanding to PT recommendation. CSW provided information on SNF referral process and treatment. Patient declined referrals stating he really wanted to go back home and would be open to PT/OT home health. Patient verbalized understanding the recommendation is SNF and understands [facility] services that would be provided. CSW reached out to [facility] colleague regarding [place].   Expected Discharge Plan: Skilled Nursing Facility Barriers to Discharge: Continued Medical Work up   Patient Goals and CMS Choice Patient states their goals for this hospitalization and ongoing recovery are:: "I just want to go back home." CMS Medicare.gov Compare Post Acute Care list provided to:: Patient Choice offered to / list presented to : Patient  Expected Discharge Plan and Services Expected Discharge Plan: [place] Acute Care Choice: [place] arrangements for the past 2 months: Single Family Home                                      Prior Living Arrangements/Services Living arrangements for the past 2 months: Single Family Home Lives with:: Self Patient language and need for interpreter reviewed:: Yes        Need for Family Participation in Patient Care: No (Comment) Care giver support system in place?: No (comment) Current home services: DME Criminal Activity/Legal Involvement Pertinent to Current Situation/Hospitalization: No - Comment as needed  Activities of Daily Living      Permission Sought/Granted   Permission granted to share information  with : Yes, Verbal Permission Granted  Share Information with NAME: for [facility] referrals           Emotional Assessment Appearance:: Appears stated age Attitude/Demeanor/Rapport: Guarded Affect (typically observed): Calm Orientation: : Oriented to Self, Oriented to Place, Oriented to  Time, Oriented to Situation Alcohol / Substance Use: Not Applicable Psych Involvement: No (comment)  Admission diagnosis:  Stroke, hemorrhagic ([place]) [I61.9] Acute right-sided weakness [R53.1] Dysarthria [R47.1] Stroke-like symptoms [R29.90] Intraparenchymal hematoma of brain with loss of consciousness, unspecified laterality, initial encounter ([place]) [S06.339A] Patient Active Problem List   Diagnosis Date Noted   Pressure injury of skin 07/27/2021   Stroke, 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   Atrial fibrillation ([place]) 12/31/2020   Bifascicular block 12/25/2020   Dyslipidemia (high LDL; low HDL) 12/25/2020   Obesity, diabetes, and hypertension syndrome ([place]) 12/25/2020   Class 3 severe obesity with serious comorbidity and body mass index (BMI) of 45.0 to 49.9 in adult ([place]) 11/15/2020   Visceral obesity 10/29/2020   Longstanding persistent atrial fibrillation ([place]) 10/15/2020   Secondary hypercoagulable state ([place]) 10/15/2020   Diabetes mellitus ([place]) 01/24/2020   Nuclear sclerotic cataract of both eyes 01/24/2020   Uncontrolled type 2 diabetes mellitus with hyperglycemia ([place]) 12/29/2019   Hypertension associated with diabetes ([place]) 12/29/2019   Body mass index (BMI) of 45.0-49.9 in adult [facility]) 12/29/2019   PCP:  Horald Pollen, MD Pharmacy:   [facility] [address]  [place] [address] Phone: [phone] Fax: [phone]     Social Determinants of Health (SDOH) Interventions    Readmission Risk Interventions No flowsheet data found.

## 2021-07-27 NOTE — Assessment & Plan Note (Signed)
-  LDL was 75, which is not significantly elevated -Continue Lipitor

## 2021-07-27 NOTE — Care Management Note (Signed)
Case Management Note  Patient Details  Name: Darius Ingram MRN: [id number] Date of Birth: 11-18-1955  Subjective/Objective:  65 yo M presented to the ED with R sided weakness, R facial droop and slurred speech. CT head showed 1.8 cm acute intraparenchymal hematoma in L internal capsule and smaller adjacent hematoma in distal L caudate. MRI showed acute L thalamocapsular intraparenchymal hemorrhage measuring up to 2.9 cm and many small acute infarcts in L frontal lobe. PMH of afib, HTN, and hypothyroidism.  He reports that he lives with other roommates. He doesn't drive. He walks or takes the bus. He reports that he has a PCP. He has medical coverage for transportation to go to his doctors appointments. He denies any issues filling his prescriptions.                 Action/Plan:  Received referral to assist with HH. Per Cover, SW, PT is recommending SNF, but pt declined SNF. Discussed D/C plan SNF vs home with HH. He reports that he is not declining SNF, but he is hoping to get better, and he can go home. He agrees with [place] if he goes home. Will continue to f/u to assist with the D/C plan.   Expected Discharge Date:                  Expected Discharge Plan:  [place]  In-House Referral:  Clinical Social Work  Discharge planning Services  CM Consult  Post Acute Care Choice:  Home Health Choice offered to:  Patient  DME Arranged:    DME Agency:     HH Arranged:    [place] Agency:     Status of Service:     If discussed at [employer], dates discussed:    Additional Comments:  Norina Buzzard, RN 07/27/2021, 1:39 PM

## 2021-07-28 ENCOUNTER — Inpatient Hospital Stay (HOSPITAL_COMMUNITY): Payer: Medicare Other

## 2021-07-28 LAB — GLUCOSE, CAPILLARY
Glucose-Capillary: 133 mg/dL — ABNORMAL HIGH (ref 70–99)
Glucose-Capillary: 154 mg/dL — ABNORMAL HIGH (ref 70–99)
Glucose-Capillary: 135 mg/dL — ABNORMAL HIGH (ref 70–99)
Glucose-Capillary: 175 mg/dL — ABNORMAL HIGH (ref 70–99)

## 2021-07-28 LAB — BASIC METABOLIC PANEL
Anion gap: 9 (ref 5–15)
BUN: 23 mg/dL (ref 8–23)
CO2: 25 mmol/L (ref 22–32)
Calcium: 8.5 mg/dL — ABNORMAL LOW (ref 8.9–10.3)
Chloride: 106 mmol/L (ref 98–111)
Creatinine, Ser: 1.15 mg/dL (ref 0.61–1.24)
GFR, Estimated: >60 mL/min (ref >60)
Glucose, Bld: 140 mg/dL — ABNORMAL HIGH (ref 70–99)
Potassium: 3.4 mmol/L — ABNORMAL LOW (ref 3.5–5.1)
Sodium: 140 mmol/L (ref 135–145)

## 2021-07-28 LAB — CBC
HCT: 40.9 % (ref 39.0–52.0)
Hemoglobin: 13.4 g/dL (ref 13.0–17.0)
MCH: 29.1 pg (ref 26.0–34.0)
MCHC: 32.8 g/dL (ref 30.0–36.0)
MCV: 88.9 fL (ref 80.0–100.0)
Platelets: 229 10*3/uL (ref 150–400)
RBC: 4.6 MIL/uL (ref 4.22–5.81)
RDW: 14.5 % (ref 11.5–15.5)
WBC: 10.5 10*3/uL (ref 4.0–10.5)
nRBC: 0 % (ref 0.0–0.2)

## 2021-07-28 NOTE — NC FL2 (Signed)
[place] LEVEL OF CARE SCREENING TOOL     IDENTIFICATION  Patient Name: Darius Ingram Birthdate: September 13, 1955 Sex: male Admission Date (Current Location): 07/25/2021  [facility] and [place] Number:  Herbalist and Address:  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   Atrial fibrillation ([place]) 12/31/2020   Bifascicular block 12/25/2020   Dyslipidemia (high LDL; low HDL) 12/25/2020   Obesity, diabetes, and hypertension syndrome ([place]) 12/25/2020   Class 3 severe obesity with serious comorbidity and body mass index (BMI) of 45.0 to 49.9 in adult ([place]) 11/15/2020   Visceral obesity 10/29/2020   Longstanding persistent atrial fibrillation ([place]) 10/15/2020   Secondary hypercoagulable state ([place]) 10/15/2020   Diabetes mellitus ([place]) 01/24/2020   Nuclear sclerotic cataract of both eyes 01/24/2020   Uncontrolled type 2 diabetes mellitus with hyperglycemia ([place]) 12/29/2019   Hypertension associated with diabetes ([place]) 12/29/2019   Body mass index (BMI) of 45.0-49.9 in adult ([place]) 12/29/2019    Orientation RESPIRATION BLADDER Height & Weight     Self, Place  O2 ([place] 2) Incontinent, External catheter Weight: (!) 330 lb (149.7 kg) Height:  6' (182.9 cm)  BEHAVIORAL SYMPTOMS/MOOD NEUROLOGICAL BOWEL NUTRITION STATUS      Incontinent    AMBULATORY STATUS COMMUNICATION OF NEEDS Skin   Total Care Verbally PU Stage and Appropriate Care (PI  stage 1 w/ foam dressing)                       Personal Care Assistance Level of Assistance  Bathing, Feeding, Dressing   Feeding assistance: Limited assistance Dressing Assistance: Maximum assistance     Functional Limitations Info  Sight, Hearing, Speech Sight Info: Adequate Hearing Info: Adequate Speech Info: Impaired    SPECIAL CARE FACTORS FREQUENCY  PT (By licensed PT), OT (By licensed OT)     PT Frequency: 5x week OT Frequency: 5x week            Contractures Contractures Info: Not present    Additional Factors Info  Code Status, Allergies, Insulin Sliding Scale Code Status Info: Full Allergies Info: Penicillins   Insulin Sliding Scale Info: Insulin Aspart (Novolog) 0-20 U 3x daily w/ meals       Current Medications (07/28/2021):  This is the current hospital active medication list Current Facility-Administered Medications  Medication Dose Route Frequency Provider Last Rate Last Admin   acetaminophen (TYLENOL) tablet 650 mg  650 mg Oral Q4H PRN Gwinda Maine, MD       Or   acetaminophen (TYLENOL) 160 MG/5ML solution 650 mg  650 mg Per Tube Q4H PRN Gwinda Maine, MD       Or   acetaminophen (TYLENOL) suppository 650 mg  650 mg Rectal Q4H PRN Gwinda Maine, MD       acetaminophen-codeine (TYLENOL #3) 300-30 MG per tablet 1-2 tablet  1-2 tablet Oral Q4H PRN Gwinda Maine, MD  amLODipine (NORVASC) tablet 10 mg  10 mg Oral Daily Janine Ores, NP   10 mg at 07/28/21 1042   insulin aspart (novoLOG) injection 0-20 Units  0-20 Units Subcutaneous TID WC Kerney Elbe, MD   4 Units at 07/28/21 1214   labetalol (NORMODYNE) injection 5-20 mg  5-20 mg Intravenous Q10 min PRN Rosalin Hawking, MD   10 mg at 07/28/21 0423   lisinopril (ZESTRIL) tablet 20 mg  20 mg Oral Daily Janine Ores, NP   20 mg at 07/28/21 1042   MEDLINE mouth rinse  15 mL Mouth Rinse BID Kerney Elbe, MD   15 mL at 07/28/21 1043   metoprolol tartrate (LOPRESSOR) tablet 25 mg  25  mg Oral BID Rosalin Hawking, MD   25 mg at 07/28/21 1042   pantoprazole (PROTONIX) EC tablet 40 mg  40 mg Oral QHS Rosalin Hawking, MD   40 mg at 07/27/21 2143   senna-docusate (Senokot-S) tablet 1 tablet  1 tablet Oral BID Gwinda Maine, MD   1 tablet at 07/27/21 1610     Discharge Medications: Please see discharge summary for a list of discharge medications.  Relevant Imaging Results:  Relevant Lab Results:   Additional Information SS# [id number]  Coralee Pesa, LCSWA

## 2021-07-28 NOTE — Progress Notes (Signed)
STROKE TEAM PROGRESS NOTE   INTERVAL HISTORY Patient seen in room alone.  Patient fitted for CPAP for at night. Doing well, no changes overnight. Repeat Head CT stable this am.    Vitals:   07/28/21 0518 07/28/21 0752 07/28/21 1040 07/28/21 1130  BP: (!) 143/82 115/74 136/70 (!) 147/88  Pulse: 83 87  90  Resp:    16  Temp:  98.5 F (36.9 C)  98.6 F (37 C)  TempSrc:  Oral  Oral  SpO2:  98%  97%  Weight:      Height:       CBC:  Recent Labs  Lab 07/25/21 0909 07/25/21 0911 07/27/21 0333 07/28/21 0135  WBC 12.2*   < > 12.0* 10.5  NEUTROABS 8.6*  --   --   --   HGB 15.0   < > 13.9 13.4  HCT 43.4   < > 40.5 40.9  MCV 87.7   < > 87.9 88.9  PLT 235   < > 236 229   < > = values in this interval not displayed.   Basic Metabolic Panel:  Recent Labs  Lab 07/25/21 1030 07/26/21 0453 07/27/21 0333 07/28/21 0135  NA  --  139 140 140  K  --  3.2* 3.5 3.4*  CL  --  106 106 106  CO2  --  24 24 25   GLUCOSE  --  135* 132* 140*  BUN  --  22 24* 23  CREATININE  --  1.13 1.17 1.15  CALCIUM  --  8.5* 8.8* 8.5*  MG 1.3* 2.3 1.8  --   PHOS 2.8  --   --   --    Lipid Panel:  Recent Labs  Lab 07/25/21 1030  CHOL 123  TRIG 96  HDL 29*  CHOLHDL 4.2  VLDL 19  LDLCALC 75   HgbA1c:  Recent Labs  Lab 07/25/21 1030  HGBA1C 6.3*   Urine Drug Screen: No results for input(s): LABOPIA, COCAINSCRNUR, LABBENZ, AMPHETMU, THCU, LABBARB in the last 168 hours.  Alcohol Level  Recent Labs  Lab 07/25/21 1030  ETH <10    IMAGING past 24 hours CT HEAD WO CONTRAST (5MM)  Result Date: 07/28/2021 CLINICAL DATA:  Stroke suspected EXAM: CT HEAD WITHOUT CONTRAST TECHNIQUE: Contiguous axial images were obtained from the base of the skull through the vertex without intravenous contrast. COMPARISON:  07/25/2021 FINDINGS: Brain: Unchanged hemorrhage in the left internal capsule and caudate body, internal capsule component measuring up to 22 mm craniocaudal on coronal reformats. No worrisome mass  effect or new site of hemorrhage. Underestimated left frontal infarcts when compared to MRI. Vascular: No hyperdense vessel or unexpected calcification. Skull: Normal. Negative for fracture or focal lesion. Sinuses/Orbits: No acute finding. IMPRESSION: Stable infarcts and hemorrhage in the left cerebral hemisphere. No new or progressive finding. Electronically Signed   By: Jorje Guild M.D.   On: 07/28/2021 07:38    PHYSICAL EXAM  Temp:  [97.5 F (36.4 C)-98.6 F (37 C)] 98.6 F (37 C) (01/01 1130) Pulse Rate:  [65-95] 90 (01/01 1130) Resp:  [16-20] 16 (01/01 1130) BP: (107-170)/(55-96) 147/88 (01/01 1130) SpO2:  [97 %-99 %] 97 % (01/01 1130)  General - Well nourished, well developed, in no apparent distress. Ophthalmologic - fundi not visualized due to noncooperation. Cardiovascular - Regular rhythm and rate.  Mental Status -  Level of arousal and orientation to time, place, and person were intact. Dysarthric, impaired articulation   Cranial Nerves II - XII -  II - Visual field intact OU. III, IV, VI - Extraocular movements intact. V - Facial sensation intact bilaterally. VII -slight facial asymmetry, right droop VIII - Hearing & vestibular intact bilaterally. X - Palate elevates symmetrically. XI - Chin turning & shoulder shrug intact bilaterally. XII - Tongue protrusion intact.  Motor Strength - The patients strength was normal in all extremities and pronator drift was absent.  Bulk was normal and fasciculations were absent.   RUE weak 0/5, able to move hand and fingers 2/5 LUE- 5/5 RLE- 2/5 moves with gravity eliminated LLE-4/5 Motor Tone - Muscle tone was assessed at the neck and appendages and was normal. Reflexes - The patients reflexes were symmetrical in all extremities and he had no pathological reflexes. Sensory - diminished sensation in the right upper and lower extremities Coordination - The patient had normal movements in the hands and feet with no ataxia or  dysmetria.  Tremor was absent. Gait and Station - deferred.   ASSESSMENT/PLAN Mr. Alioune Hodgkin is a 66 y.o. male with history of DM2, HLD, HTN, Hypothyroidism, joint pain, and afib on elquis with no missed doses presenting after a fall with right side weakness, slurred speech, right facial weakness starting 12/29 at 0715.  Received andexxa to reverse eliquis coagulopathy.  Initial CT showed a 1.8 cm acute intraparenchymal hematoma at the left internal capsule.  MRI today showed a possible increase in the IPH and small acute infarcts in the left frontal lobe.  LDL is 75 and hemoglobin A1c was 6.3.  Physical therapy recommends SNF and speech therapy recommends acute inpatient rehab.  Home HTN medications resumed and cleviprex is off. Plan to on board hospitalist and move out of ICU today. Transfer orders placed.   ICH vs. 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 CTA head no evidence of active extravasation into the intraparenchymal hematoma, no evidence of AVM. 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 Severe stenosis at the origin of the right A1 segment and mild atherosclerotic irregularity of the distal MCA and PCA branches MRI - Acute left thalamocapsular IPH measuring up to approximately 2.0 cm. Small acute infarcts in the left frontal lobe. Carotid Doppler unremarkable 2D Echo EF 55-60%, LV normal function  LDL 75 HgbA1c 6.3 VTE prophylaxis - SCDs Eliquis (apixaban) daily prior to admission, now on No antithrombotic due to [place].  May  consider resume anticoagulation once ICH resolves.  May consider Pradaxa to replace eliquis at that time given likely Eliquis failure. Repeat CTH this am stable. Consider adding AC starting Tuesday at Day 5 post bleed. Therapy recommendations: SNF  Disposition:  Pending  Atrial fibrillation Previously on Eliquis 5 mg twice daily Hold off AC in the setting of hemorrhage Rate controlled with metoprolol 12.5 twice daily ->25mg  bid May consider anticoagulation once hematoma resolves, however, at that time may consider Pradaxa given potential Eliquis failure.  Hypertension Home meds:  lisinopril,  amlodipine 10 mg Resume home medications Long-term BP goal normotensive BP goal now < 160 On Cleviprex IV, taper off as able On home lisinopril 20, amlodipine 10, increase metoprolol to  25 twice daily PRN labelotol  Hyperlipidemia Home meds:  atorvastatin 40mg  LDL 75, goal < 70 Hold in the setting of IPH Resume statin on discharge  Diabetes type II Controlled Home meds: Trulicity, metformin PYYF1T 6.3, goal < 7.0 CBGs SSI Close PCP follow-up as outpatient  Other Stroke Risk Factors Advanced Age >/= 33  Former Cigarette smoker, quit 22 years ago obesity, Body mass index is 44.76 kg/m., BMI >/= 30 associated with increased stroke risk, recommend weight loss, diet and exercise as appropriate   Other Active Problems Hypomagnesemia 1.4-> replaced with 4 mg IV Repeat 2.3 ->1.8  Hospital day # 3  Left BG ICH. Head CT today is stable. Exam is stable. Restart AC on day 5. Pending SNF placement.  Total of 30 mins spent reviewing chart, discussion with patient and family on prognosis, Dx and plan. Discussed case with patient's nurse. Reviewed Imaging personally.   [place]ville Dani,MD    To contact Stroke Continuity provider, please refer to [email or web address] After hours, contact General Neurology

## 2021-07-28 NOTE — Progress Notes (Signed)
PROGRESS NOTE  Darius Ingram  DOB: 1956-02-20  PCP: Horald Pollen, MD DTO:[id number]  DOA: 07/25/2021  LOS: 3 days  Hospital Day: 4  Chief Complaint  Patient presents with   Code Stroke    Brief narrative: Darius Ingram is a 66 y.o. male with PMH significant for DM2, HTN, HLD, A. fib on Eliquis, hypothyroidism. Patient presented to the ED on 12/29 after a fall with right-sided weakness, slurred speech, right facial weakness. Initial CT scan of head showed a 1.8 cm acute intraparenchymal hematoma at the left internal capsule. MRI brain showed a possible increase in the IPH and a small acute infarct in the left frontal lobe Patient was given Andexxa to reverse Eliquis coagulopathy He was admitted to neuro ICU with nicardipine drip. With clinical improvement, patient was transferred out on 12/31.   Subjective: Patient was seen and examined this morning.  Pleasant elderly Caucasian male.  Lying on bed.  Not in distress.  No new symptoms. Chart reviewed No fever last 24 hours, heart rate in 90s, blood pressure in 140s Labs with potassium low at 3.4 CT head 1/1 showed stable infarcts and hemorrhages in left hemisphere.  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 fib with Eliquis failure.  MRI brain also showed an acute small infarct in the left frontal lobe. -He was treated with adnexa for Eliquis reversal. -His current deficits include right hemiparesis. -Repeat CT head this morning on 1/1 showed stable infarcts and hemorrhages in the left hemisphere. -Currently Eliquis is on hold.  Per neurology note, may consider resuming anticoagulation once ICH resolves.  Since this is considered as Eliquis failure leading to ischemic  infarcts and possible hemorrhagic conversion, it should be replaced by another oral anticoagulant. -SNF pending.  A. Fib -Rate controlled on Lopressor. -Previously on Eliquis, currently on hold.  Ultimately would require another anticoagulation after ICH resolves.  Essential hypertension -Continue Lopressor, lisinopril, Norvasc  Type 2 diabetes mellitus -A1c 6.3 on 07/25/2021 -Home meds include Trulicity, metformin -Currently on sliding scale insulin with Accu-Cheks Recent Labs  Lab 07/27/21 1123 07/27/21 1535 07/27/21 2129 07/28/21 0613 07/28/21 1159  GLUCAP 146* 125* 156* 133* 154*   Dyslipidemia -Continue Lipitor  Pressure injury of skin -Stage 1 sacral pressure ulcer appreciated on admission -Needs monitoring  Morbid obesity  -Body mass index is 44.76 kg/m. Patient has been advised to make an attempt to improve diet and exercise patterns to aid in weight loss.  Mobility: PT eval Living condition: Was living alone at home Goals of care:   Code Status: Full Code  Nutritional status: Body mass index is 44.76 kg/m.      Diet:  Diet Order             Diet Heart Room service appropriate? Yes with Assist; Fluid consistency: Thin  Diet effective now                  DVT prophylaxis:  SCD's Start: 07/25/21 0959   Antimicrobials: None Fluid: None Consultants: Neurology Family Communication: None at bedside  Status is: Inpatient  Continue in-hospital care because: Pending SNF Level of care: Telemetry Medical   Dispo: The patient is from: Home              Anticipated d/c is to: SNF  Patient currently is not medically stable to d/c.   Difficult to place patient No     Infusions:    Scheduled Meds:  amLODipine  10 mg Oral Daily   insulin aspart  0-20 Units Subcutaneous TID WC   lisinopril  20 mg Oral Daily   mouth rinse  15 mL Mouth Rinse BID   metoprolol tartrate  25 mg Oral BID   pantoprazole  40 mg Oral QHS   senna-docusate  1  tablet Oral BID    PRN meds: acetaminophen **OR** acetaminophen (TYLENOL) oral liquid 160 mg/5 mL **OR** acetaminophen, acetaminophen-codeine, labetalol   Antimicrobials: Anti-infectives (From admission, onward)    None       Objective: Vitals:   07/28/21 1040 07/28/21 1130  BP: 136/70 (!) 147/88  Pulse:  90  Resp:  16  Temp:  98.6 F (37 C)  SpO2:  97%   No intake or output data in the 24 hours ending 07/28/21 1346 Filed Weights   07/25/21 0900 07/25/21 0936  Weight: (!) 152.6 kg (!) 149.7 kg   Weight change:  Body mass index is 44.76 kg/m.   Physical Exam: General exam: Pleasant, elderly Caucasian male.  Not in distress Skin: No rashes, lesions or ulcers. HEENT: Atraumatic, normocephalic, no obvious bleeding Lungs: Clear to auscultation bilaterally CVS: Regular rate and rhythm, no murmur GI/Abd soft, nontender nondistended, bowels are present CNS: Alert, awake, oriented x3, right hemiparesis Psychiatry: Mood appropriate Extremities: No pedal edema, no calf tenderness  Data Review: I have personally reviewed the laboratory data and studies available.  F/u labs ordered Unresulted Labs (From admission, onward)     Start     Ordered   07/26/21 0500  CBC  Daily,   R     Question:  Specimen collection method  Answer:  Lab=Lab collect   07/25/21 2334   07/26/21 2956  Basic metabolic panel  Daily,   R     Question:  Specimen collection method  Answer:  Lab=Lab collect   07/25/21 2334            Signed, Terrilee Croak, MD [facility] 07/28/2021

## 2021-07-28 NOTE — Plan of Care (Signed)
°  Problem: Education: Goal: Knowledge of General Education information will improve Description: Including pain rating scale, medication(s)/side effects and non-pharmacologic comfort measures Outcome: Progressing   Problem: Clinical Measurements: Goal: Will remain free from infection Outcome: Progressing Goal: Diagnostic test results will improve Outcome: Progressing   Problem: Elimination: Goal: Will not experience complications related to bowel motility Outcome: Progressing Goal: Will not experience complications related to urinary retention Outcome: Progressing

## 2021-07-28 NOTE — Progress Notes (Signed)
Patient refuse NIV for the night, patients states he does not want to wear it tonight. Machine is at bedside If changes his mind. No distress or complications noted at this time.

## 2021-07-28 NOTE — TOC Progression Note (Signed)
Transition of Care [facility]) - Progression Note    Patient Details  Name: Lydell Moga MRN: [id number] Date of Birth: February 06, 1956  Transition of Care [facility]) CM/SW [place], [place] Phone Number: 07/28/2021, 1:27 PM  Clinical Narrative:    CSW met with pt and sister at bedside. CSW introduced self and explained role at the hospital. Pt reports that PTA he lived at home alone and thinks he would need PT to independently go back home. Pt sister informed CSW that she was the only emergency contact person but [place]. CSW explained insurance and how it works with the co-pays after the 20 days. CSW will provide pt with bed approvals tomorrow. Pt seems oriented but may take a little extra time to answer questions. Pt has been faxed out.   Expected Discharge Plan: [place] Barriers to Discharge: Continued Medical Work up  Expected Discharge Plan and Services Expected Discharge Plan: [place] In-house Referral: Clinical Social Work Discharge Planning Services: CM Consult Post Acute Care Choice: [place] arrangements for the past 2 months: Single Family Home                                       Social Determinants of Health (SDOH) Interventions    Readmission Risk Interventions No flowsheet data found.

## 2021-07-28 NOTE — TOC Progression Note (Signed)
Transition of Care [place] [facility]) - Progression Note    Patient Details  Name: Darius Ingram MRN: [id number] Date of Birth: [birth year]  Transition of Care [facility]) CM/SW Contact  Darius Collet, RN Phone Number: 07/28/2021, 11:47 AM  Clinical Narrative:    Darius Ingram w patient at bedside. He confirms that lived at home alone prior to admission, was independent and used a cane.  Discussed PT recs for SNF and assessment stating he needed the assist of two people for even bed mobility. He states that all he can really move is his R arm some. He has not ambulated, he is not able to get his legs off the bad or move them much in bed. He states that his speech is impaired as well. He confirms that he lives at home alone, and understanding how much assistance he would need including 24/7 supervision for safety due to being bed bound (likely to progress with therapy) he cannot go home at this time.  He is understanding and agreeable to SNF and being sent out [facility].  Will request CSW to do SNF workup.     Expected Discharge Plan: [place] Barriers to Discharge: Unsafe home situation  Expected Discharge Plan and Services Expected Discharge Plan: [place] In-house Referral: Clinical Social Work Discharge Planning Services: CM Consult Post Acute Care Choice: [place] arrangements for the past 2 months: Single Family Home                                       Social Determinants of Health (SDOH) Interventions    Readmission Risk Interventions No flowsheet data found.

## 2021-07-29 LAB — CBC
HCT: 41.3 % (ref 39.0–52.0)
Hemoglobin: 13.9 g/dL (ref 13.0–17.0)
MCH: 29.6 pg (ref 26.0–34.0)
MCHC: 33.7 g/dL (ref 30.0–36.0)
MCV: 87.9 fL (ref 80.0–100.0)
Platelets: 234 10*3/uL (ref 150–400)
RBC: 4.7 MIL/uL (ref 4.22–5.81)
RDW: 14.2 % (ref 11.5–15.5)
WBC: 14.5 10*3/uL — ABNORMAL HIGH (ref 4.0–10.5)
nRBC: 0 % (ref 0.0–0.2)

## 2021-07-29 LAB — BASIC METABOLIC PANEL
Anion gap: 10 (ref 5–15)
BUN: 17 mg/dL (ref 8–23)
CO2: 26 mmol/L (ref 22–32)
Calcium: 8.4 mg/dL — ABNORMAL LOW (ref 8.9–10.3)
Chloride: 105 mmol/L (ref 98–111)
Creatinine, Ser: 0.93 mg/dL (ref 0.61–1.24)
GFR, Estimated: >60 mL/min (ref >60)
Glucose, Bld: 145 mg/dL — ABNORMAL HIGH (ref 70–99)
Potassium: 3.5 mmol/L (ref 3.5–5.1)
Sodium: 141 mmol/L (ref 135–145)

## 2021-07-29 LAB — GLUCOSE, CAPILLARY
Glucose-Capillary: 130 mg/dL — ABNORMAL HIGH (ref 70–99)
Glucose-Capillary: 196 mg/dL — ABNORMAL HIGH (ref 70–99)
Glucose-Capillary: 159 mg/dL — ABNORMAL HIGH (ref 70–99)
Glucose-Capillary: 145 mg/dL — ABNORMAL HIGH (ref 70–99)

## 2021-07-29 NOTE — TOC Progression Note (Signed)
Transition of Care [facility]) - Progression Note    Patient Details  Name: Darius Ingram MRN: [id number] Date of Birth: 1956-06-16  Transition of Care [facility]) CM/SW [place], [place] Phone Number: 07/29/2021, 12:52 PM  Clinical Narrative:   CSW met with patient to provide bed offers. Patient chose [facility]. CSW asked [facility] about bed availability, but Admissions is not in the office today, have to check back tomorrow. CSW to follow.    Expected Discharge Plan: [place] Barriers to Discharge: Continued Medical Work up, Ship [employer]  Expected Discharge Plan and Services Expected Discharge Plan: [place] In-house Referral: Clinical Social Work Discharge Planning Services: CM Consult Post Acute Care Choice: [place] arrangements for the past 2 months: Single Family Home                                       Social Determinants of Health (SDOH) Interventions    Readmission Risk Interventions No flowsheet data found.

## 2021-07-29 NOTE — Progress Notes (Signed)
Physical Therapy Treatment Patient Details Name: Darius Ingram MRN: [id number] DOB: October 04, 1955 Today's Date: 07/29/2021   History of Present Illness 66 y/o male presented to ED on 12/29 for R sided weakness, R facial droop, and slurred speech. CT head on 12/29 showed 1.8 cm acute intraparenchymal hematoma in L internal capsule and smaller adjacent hematoma in distal L caudate. MRI on 12/30 showed acute L thalamocapsular intraparenchymal hemorrhage measuring up to 2.0 cm and many small acute infarcts in L frontal lobe. PMH: Afib, HTN, hypothyroidism    PT Comments    Pt received in supine, sleeping but easily awoken and agreeable to therapy session, with good participation and tolerance for transfer training and supine/seated exercises. Pt needing less assist for bed mobility and with improved seated balance this session, but continues to need up to modA trunk support while seated EOB. Pt able to initiate sit<>stand transfer training to RW with up to maxA +2 support needed due to Rt weakness/lean. Pt orthostatics taken and SBP stable this date, HR variable 100-127 bpm at rest and with exertion. Pt continues to benefit from PT services to progress toward functional mobility goals.    Recommendations for follow up therapy are one component of a multi-disciplinary discharge planning process, led by the attending physician.  Recommendations may be updated based on patient status, additional functional criteria and insurance authorization.  Follow Up Recommendations  Skilled nursing-short term rehab (<3 hours/day)     Assistance Recommended at Discharge Frequent or constant Supervision/Assistance  Equipment Recommendations  Other (comment) (TBD)    Recommendations for Other Services       Precautions / Restrictions Precautions Precautions: Fall Precaution Comments: watch HR and BP Restrictions Weight Bearing Restrictions: No     Mobility  Bed Mobility Overal bed mobility: Needs  Assistance Bed Mobility: Rolling;Sidelying to Sit;Sit to Sidelying Rolling: Min assist;Mod assist Sidelying to sit: Mod assist;+2 for physical assistance     Sit to sidelying: Max assist;+2 for physical assistance;+2 for safety/equipment General bed mobility comments: pt rolling toward Rt side with minA, toward Lt side with modA, rolling x4 for clean-up prior to EOB    Transfers Overall transfer level: Needs assistance Equipment used: Rolling walker (2 wheels) Transfers: Sit to/from Stand Sit to Stand: Max assist;+2 physical assistance           General transfer comment: from elevated bed, multimodal cues for hand placement, pt fatigues quickly on standing able to stand ~60 seconds with encouragement    Ambulation/Gait             Pre-gait activities: pt unable to weight shift in stance     Stairs             Wheelchair Mobility    Modified Rankin (Stroke Patients Only) Modified Rankin (Stroke Patients Only) Pre-Morbid Rankin Score: No symptoms Modified Rankin: Severe disability     Balance Overall balance assessment: Needs assistance Sitting-balance support: Single extremity supported;Feet supported Sitting balance-Leahy Scale: Poor Sitting balance - Comments: pt able to use LUE to self-support, without UE support needing min to modA trunk support Postural control: Posterior lean;Right lateral lean Standing balance support: Single extremity supported Standing balance-Leahy Scale: Zero Standing balance comment: +2 maxA to remain upright and heavy lean to Rt side                            Cognition Arousal/Alertness: Awake/alert Behavior During Therapy: WFL for tasks assessed/performed Overall Cognitive Status: Impaired/Different from  baseline Area of Impairment: Awareness;Problem solving;Following commands;Attention                   Current Attention Level: Sustained   Following Commands: Follows one step commands with  increased time   Awareness: Emergent Problem Solving: Requires verbal cues;Slow processing General Comments: pleasant, cooperative        Exercises Other Exercises Other Exercises: supine RLE AAROM: heel slides, hip abduction x5 reps ea Other Exercises: supine RLE PROM: ankle pumps x10 reps Other Exercises: supine LLE AROM: hip flexion, ankle pumps, quad sets x10 reps ea Other Exercises: supine RUE A/AAROM: gross grasp/finger extension, elbow flex/ext, shoulder flexion (AA) x10 reps ea    General Comments General comments (skin integrity, edema, etc.): BP 126/90 (99) seated EOB, SBP 128 standing (did not record full reading) HR variable 100-121 bpm supine (afib?) and 110-127 bpm with sitting/standing per tele monitor; VSS on 2L O2 [place] in supine and on RA seated EOB      Pertinent Vitals/Pain Pain Assessment: Faces Faces Pain Scale: Hurts little more Pain Location: left arm d/t BP cuff Pain Descriptors / Indicators: Discomfort;Grimacing Pain Intervention(s): Limited activity within patient's tolerance;Monitored during session;Repositioned    Home Living                          Prior Function            PT Goals (current goals can now be found in the care plan section) Acute Rehab PT Goals Patient Stated Goal: did not state PT Goal Formulation: With patient Time For Goal Achievement: 08/09/21 Progress towards PT goals: Progressing toward goals    Frequency    Min 3X/week      PT Plan Current plan remains appropriate    Co-evaluation              AM-PAC PT "6 Clicks" Mobility   Outcome Measure  Help needed turning from your back to your side while in a flat bed without using bedrails?: A Lot Help needed moving from lying on your back to sitting on the side of a flat bed without using bedrails?: A Lot Help needed moving to and from a bed to a chair (including a wheelchair)?: Total Help needed standing up from a chair using your arms (e.g.,  wheelchair or bedside chair)?: Total (+2 maxA) Help needed to walk in hospital room?: Total Help needed climbing 3-5 steps with a railing? : Total 6 Click Score: 8    End of Session Equipment Utilized During Treatment: Gait belt;Oxygen Activity Tolerance: Patient tolerated treatment well Patient left: in bed;with call bell/phone within reach;with bed alarm set;with SCD's reapplied Nurse Communication: Mobility status;Need for lift equipment (maxi-move for OOB) PT Visit Diagnosis: Unsteadiness on feet (R26.81);Muscle weakness (generalized) (M62.81);Difficulty in walking, not elsewhere classified (R26.2);Other symptoms and signs involving the nervous system (R29.898);Hemiplegia and hemiparesis Hemiplegia - Right/Left: Right Hemiplegia - dominant/non-dominant: Dominant Hemiplegia - caused by: Nontraumatic intracerebral hemorrhage     Time: 1032-1108 PT Time Calculation (min) (ACUTE ONLY): 36 min  Charges:  $Therapeutic Exercise: 8-22 mins $Therapeutic Activity: 8-22 mins                     Damaree Sargent P., PTA Acute Rehabilitation Services Pager: [phone] Office: [place] 07/29/2021, 12:13 PM

## 2021-07-29 NOTE — Care Management Important Message (Signed)
Important Message  Patient Details  Name: Darius Ingram MRN: [id number] Date of Birth: 11-Jun-1956   Medicare Important Message Given:  Yes     Tyress Loden [address] 07/29/2021, 3:05 PM

## 2021-07-29 NOTE — Progress Notes (Addendum)
STROKE TEAM PROGRESS NOTE   INTERVAL HISTORY Patient seen in room alone.   He is lying in bed comfortably.  Continues to have mild right-sided weakness and dysarthria which is unchanged.  Blood pressure adequately controlled.  He is awaiting transfer to  rehab next few days.  No new complaints.  Vital signs stable.  CT scan of the head appears stable done yesterday.   Vitals:   07/29/21 0343 07/29/21 0405 07/29/21 0736 07/29/21 1116  BP: 109/65  (!) 158/89 131/66  Pulse: (!) 105  83 96  Resp: 20 19  20   Temp: 98.9 F (37.2 C)  97.8 F (36.6 C) 98.1 F (36.7 C)  TempSrc: Oral  Oral Oral  SpO2: 95%  99% 98%  Weight:      Height:       CBC:  Recent Labs  Lab 07/25/21 0909 07/25/21 0911 07/28/21 0135 07/29/21 0233  WBC 12.2*   < > 10.5 14.5*  NEUTROABS 8.6*  --   --   --   HGB 15.0   < > 13.4 13.9  HCT 43.4   < > 40.9 41.3  MCV 87.7   < > 88.9 87.9  PLT 235   < > 229 234   < > = values in this interval not displayed.   Basic Metabolic Panel:  Recent Labs  Lab 07/25/21 1030 07/26/21 0453 07/27/21 0333 07/28/21 0135 07/29/21 0233  NA  --  139 140 140 141  K  --  3.2* 3.5 3.4* 3.5  CL  --  106 106 106 105  CO2  --  24 24 25 26   GLUCOSE  --  135* 132* 140* 145*  BUN  --  22 24* 23 17  CREATININE  --  1.13 1.17 1.15 0.93  CALCIUM  --  8.5* 8.8* 8.5* 8.4*  MG 1.3* 2.3 1.8  --   --   PHOS 2.8  --   --   --   --    Lipid Panel:  Recent Labs  Lab 07/25/21 1030  CHOL 123  TRIG 96  HDL 29*  CHOLHDL 4.2  VLDL 19  LDLCALC 75   HgbA1c:  Recent Labs  Lab 07/25/21 1030  HGBA1C 6.3*   Urine Drug Screen: No results for input(s): LABOPIA, COCAINSCRNUR, LABBENZ, AMPHETMU, THCU, LABBARB in the last 168 hours.  Alcohol Level  Recent Labs  Lab 07/25/21 1030  ETH <10    IMAGING past 24 hours No results found.  PHYSICAL EXAM  Temp:  [97.8 F (36.6 C)-99.5 F (37.5 C)] 98.1 F (36.7 C) (01/02 1116) Pulse Rate:  [83-105] 96 (01/02 1116) Resp:  [15-20] 20  (01/02 1116) BP: (103-158)/(58-89) 131/66 (01/02 1116) SpO2:  [95 %-99 %] 98 % (01/02 1116)  General -obese middle-aged Caucasian male d, in no apparent distress. Ophthalmologic - fundi not visualized due to noncooperation. Cardiovascular - Regular rhythm and rate.  Mental Status -  Level of arousal and orientation to time, place, and person were intact. Dysarthric, impaired articulation   Cranial Nerves II - XII - II - Visual field intact OU. III, IV, VI - Extraocular movements intact. V - Facial sensation intact bilaterally. VII -slight lower right facial asymmetry, right droop VIII - Hearing & vestibular intact bilaterally. X - Palate elevates symmetrically. XI - Chin turning & shoulder shrug intact bilaterally. XII - Tongue protrusion intact.  Motor Strength - The patients strength was normal in all extremities and pronator drift was absent.  Bulk was normal  and fasciculations were absent.   RUE weak 0/5 proximally, able to move hand and fingers 2/5 LUE- 5/5 RLE- 2/5 moves with gravity eliminated LLE-4/5 Motor Tone - Muscle tone was assessed at the neck and appendages and was normal. Reflexes - The patients reflexes were symmetrical in all extremities and he had no pathological reflexes. Sensory - diminished sensation in the right upper and lower extremities Coordination - The patient had normal movements in the hands and feet with no ataxia or dysmetria.  Tremor was absent. Gait and Station - deferred.   ASSESSMENT/PLAN Mr. Faizan Geraci is a 66 y.o. male with history of DM2, HLD, HTN, Hypothyroidism, joint pain, and afib on elquis with no missed doses presenting after a fall with right side weakness, slurred speech, right facial weakness starting 12/29 at 0715.  Received andexxa to reverse eliquis coagulopathy.  Initial CT showed a 1.8 cm acute intraparenchymal hematoma at the left internal capsule.  MRI today showed a possible increase in the IPH and small acute  infarcts in the left frontal lobe.  LDL is 75 and hemoglobin A1c was 6.3.  Physical therapy recommends SNF and speech therapy recommends acute inpatient rehab.  Home HTN medications resumed and cleviprex is off. Plan to on board hospitalist and move out of ICU today. Transfer orders placed.   ICH vs. Hemorrhagic conversion - left BG small hematoma either due to eliquis use in the setting of hypertension or due to hemorrhagic conversion from afib failed eliquis Additional ischemic strokes - left frontal scattered small infarcts, embolic, could be due to afib failed eliquis Code Stroke CT - 1.8 cm acute intraparenchymal hematoma centered in the left internal capsule with mild surrounding edema but no significant mass effect, and smaller adjacent hematoma in the distal left caudate. CTA head no evidence of active extravasation into the intraparenchymal hematoma, no evidence of AVM. 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 Severe stenosis at the origin of the right A1 segment and mild atherosclerotic irregularity of the distal MCA and PCA branches MRI - Acute left thalamocapsular IPH measuring up to approximately 2.0 cm. Small acute infarcts in the left frontal lobe. Carotid Doppler unremarkable 2D Echo EF 55-60%, LV normal function  LDL 75 HgbA1c 6.3 VTE prophylaxis - SCDs Eliquis (apixaban) daily prior to admission, now on No antithrombotic due to [place].  May consider resume anticoagulation once ICH resolves.  May consider Pradaxa to replace eliquis at that time given likely Eliquis failure. Therapy recommendations: SNF  Disposition:  Pending  Atrial fibrillation Previously on Eliquis 5 mg twice daily Hold off AC in the setting of hemorrhage Rate controlled with metoprolol 12.5 twice daily ->25mg  bid May consider anticoagulation once hematoma resolves, however, at that time may consider Pradaxa given potential Eliquis failure.  Hypertension Home meds:   lisinopril,  amlodipine 10 mg Resume home medications Long-term BP goal normotensive BP goal now < 160 On Cleviprex IV, taper off as able On home lisinopril 20, amlodipine 10, increase metoprolol to 25 twice daily PRN labelotol  Hyperlipidemia Home meds:  atorvastatin 40mg  LDL 75, goal < 70 Hold in the setting of IPH Resume statin on discharge  Diabetes type II Controlled Home meds: Trulicity, metformin NTIR4E 6.3, goal < 7.0 CBGs SSI Close PCP follow-up as outpatient  Other Stroke Risk Factors Advanced Age >/= 67  Former Cigarette smoker, quit 22 years ago obesity, Body mass index is 44.76 kg/m., BMI >/= 30 associated with increased stroke risk, recommend weight loss, diet  and exercise as appropriate   Other Active Problems Hypomagnesemia 1.4-> replaced with 4 mg IV Repeat 2.3 ->1.8  Hospital day # 4   I have personally obtained history,examined this patient, reviewed notes, independently viewed imaging studies, participated in medical decision making and plan of care.ROS completed by me personally and pertinent positives fully documented  I have made any additions or clarifications directly to the above note.  Continue aggressive blood pressure control systolic goal below 276.  Mobilize out of bed.  Continue ongoing therapies.  Patient medically stable to be transferred to rehab when bed available in the next few days.  Greater than 50% time during this 35-minute visit was spent in counseling and coordination of care and discussion with patient and care team and answering questions. Antony Contras, MD Medical Director [facility] Pager: [phone] 07/29/2021 1:20 PM    To contact Stroke Continuity provider, please refer to [email or web address] After hours, contact General Neurology

## 2021-07-29 NOTE — Plan of Care (Signed)
°  Problem: Education: Goal: Knowledge of disease or condition will improve Outcome: Progressing Goal: Knowledge of secondary prevention will improve (SELECT ALL) Outcome: Progressing Goal: Knowledge of patient specific risk factors will improve (INDIVIDUALIZE FOR PATIENT) Outcome: Progressing Goal: Individualized Educational Video(s) Outcome: Progressing   Problem: Education: Goal: Knowledge of General Education information will improve Description: Including pain rating scale, medication(s)/side effects and non-pharmacologic comfort measures Outcome: Progressing   Problem: Health Behavior/Discharge Planning: Goal: Ability to manage health-related needs will improve Outcome: Progressing   Problem: Clinical Measurements: Goal: Ability to maintain clinical measurements within normal limits will improve Outcome: Progressing Goal: Will remain free from infection Outcome: Progressing Goal: Diagnostic test results will improve Outcome: Progressing   Problem: Elimination: Goal: Will not experience complications related to bowel motility Outcome: Progressing Goal: Will not experience complications related to urinary retention Outcome: Progressing   Problem: Self-Care: Goal: Ability to participate in self-care as condition permits will improve Outcome: Progressing Goal: Verbalization of feelings and concerns over difficulty with self-care will improve Outcome: Progressing Goal: Ability to communicate needs accurately will improve Outcome: Progressing   Problem: Ischemic Stroke/TIA Tissue Perfusion: Goal: Complications of ischemic stroke/TIA will be minimized Outcome: Progressing   Problem: Education: Goal: Knowledge of disease or condition will improve Outcome: Progressing Goal: Knowledge of secondary prevention will improve (SELECT ALL) Outcome: Progressing Goal: Knowledge of patient specific risk factors will improve (INDIVIDUALIZE FOR PATIENT) Outcome: Progressing Goal:  Individualized Educational Video(s) Outcome: Progressing   Problem: Education: Goal: Knowledge of General Education information will improve Description: Including pain rating scale, medication(s)/side effects and non-pharmacologic comfort measures Outcome: Progressing   Problem: Health Behavior/Discharge Planning: Goal: Ability to manage health-related needs will improve Outcome: Progressing   Problem: Clinical Measurements: Goal: Ability to maintain clinical measurements within normal limits will improve Outcome: Progressing Goal: Will remain free from infection Outcome: Progressing Goal: Diagnostic test results will improve Outcome: Progressing   Problem: Elimination: Goal: Will not experience complications related to bowel motility Outcome: Progressing Goal: Will not experience complications related to urinary retention Outcome: Progressing   Problem: Self-Care: Goal: Ability to participate in self-care as condition permits will improve Outcome: Progressing Goal: Verbalization of feelings and concerns over difficulty with self-care will improve Outcome: Progressing Goal: Ability to communicate needs accurately will improve Outcome: Progressing   Problem: Ischemic Stroke/TIA Tissue Perfusion: Goal: Complications of ischemic stroke/TIA will be minimized Outcome: Progressing

## 2021-07-30 LAB — GLUCOSE, CAPILLARY
Glucose-Capillary: 136 mg/dL — ABNORMAL HIGH (ref 70–99)
Glucose-Capillary: 151 mg/dL — ABNORMAL HIGH (ref 70–99)
Glucose-Capillary: 161 mg/dL — ABNORMAL HIGH (ref 70–99)
Glucose-Capillary: 137 mg/dL — ABNORMAL HIGH (ref 70–99)

## 2021-07-30 LAB — CBC
HCT: 41.9 % (ref 39.0–52.0)
Hemoglobin: 13.9 g/dL (ref 13.0–17.0)
MCH: 29.6 pg (ref 26.0–34.0)
MCHC: 33.2 g/dL (ref 30.0–36.0)
MCV: 89.1 fL (ref 80.0–100.0)
Platelets: 230 10*3/uL (ref 150–400)
RBC: 4.7 MIL/uL (ref 4.22–5.81)
RDW: 14.3 % (ref 11.5–15.5)
WBC: 13.8 10*3/uL — ABNORMAL HIGH (ref 4.0–10.5)
nRBC: 0 % (ref 0.0–0.2)

## 2021-07-30 LAB — BASIC METABOLIC PANEL
Anion gap: 8 (ref 5–15)
BUN: 22 mg/dL (ref 8–23)
CO2: 25 mmol/L (ref 22–32)
Calcium: 8.3 mg/dL — ABNORMAL LOW (ref 8.9–10.3)
Chloride: 105 mmol/L (ref 98–111)
Creatinine, Ser: 0.96 mg/dL (ref 0.61–1.24)
GFR, Estimated: >60 mL/min (ref >60)
Glucose, Bld: 148 mg/dL — ABNORMAL HIGH (ref 70–99)
Potassium: 3.6 mmol/L (ref 3.5–5.1)
Sodium: 138 mmol/L (ref 135–145)

## 2021-07-30 LAB — RESP PANEL BY RT-PCR (FLU A&B, COVID) ARPGX2
Influenza A by PCR: NEGATIVE
Influenza B by PCR: NEGATIVE
SARS Coronavirus 2 by RT PCR: NEGATIVE

## 2021-07-30 MED ORDER — ASPIRIN EC 81 MG PO TBEC
81.0000 mg | DELAYED_RELEASE_TABLET | Freq: Every day | ORAL | Status: DC
  Administered 2021-07-30 – 2021-07-31 (×2): 81 mg via ORAL
  Filled 2021-07-30 (×2): qty 1

## 2021-07-30 NOTE — Plan of Care (Signed)
°  Problem: Education: Goal: Knowledge of disease or condition will improve Outcome: Progressing   Problem: Self-Care: Goal: Ability to participate in self-care as condition permits will improve Outcome: Progressing Goal: Verbalization of feelings and concerns over difficulty with self-care will improve Outcome: Progressing Goal: Ability to communicate needs accurately will improve Outcome: Progressing

## 2021-07-30 NOTE — Progress Notes (Signed)
Occupational Therapy Treatment Patient Details Name: Haiden Rawlinson MRN: [id number] DOB: [birth year] Today's Date: 07/30/2021   History of present illness 66 y/o male presented to ED on 12/29 for R sided weakness, R facial droop, and slurred speech. CT head on 12/29 showed 1.8 cm acute intraparenchymal hematoma in L internal capsule and smaller adjacent hematoma in distal L caudate. MRI on 12/30 showed acute L thalamocapsular intraparenchymal hemorrhage measuring up to 2.0 cm and many small acute infarcts in L frontal lobe. PMH: Afib, HTN, hypothyroidism   OT comments  Patient received in bed and agreeable to OT session. Patient was max assist to get to EOB and once on EOB patient was mod assist for sitting balance and progressed to min assist to min guard for balance. Patient performed lateral leaning and light grooming while sitting on EOB.  RUE AAROM performed with patient demonstrating AROM for RUE elbow, wrist and finger flexion and extension but quickly fatigues.  Patient assisted back to supine and positioned to address RUE edema.  Acute OT to continue to follow.    Recommendations for follow up therapy are one component of a multi-disciplinary discharge planning process, led by the attending physician.  Recommendations may be updated based on patient status, additional functional criteria and insurance authorization.    Follow Up Recommendations  Skilled nursing-short term rehab (<3 hours/day)    Assistance Recommended at Discharge Frequent or constant Supervision/Assistance  Patient can return home with the following  Two people to help with walking and/or transfers;A lot of help with bathing/dressing/bathroom;Assistance with cooking/housework;Assistance with feeding;Direct supervision/assist for medications management;Direct supervision/assist for financial management;Assist for transportation;Help with stairs or ramp for entrance   Equipment Recommendations  Wheelchair cushion  (measurements OT);Wheelchair (measurements OT)    Recommendations for Other Services      Precautions / Restrictions Precautions Precautions: Fall Precaution Comments: watch HR and BP Restrictions Weight Bearing Restrictions: No       Mobility Bed Mobility Overal bed mobility: Needs Assistance Bed Mobility: Rolling;Sidelying to Sit;Sit to Sidelying Rolling: Min assist;Mod assist Sidelying to sit: Max assist     Sit to sidelying: Max assist General bed mobility comments: max assist of one due to asssitance needed with BLEs and trunk    Transfers                         Balance Overall balance assessment: Needs assistance Sitting-balance support: Single extremity supported;Feet supported Sitting balance-Leahy Scale: Poor Sitting balance - Comments: mod assist initially for sitting balance with patient using LUE to assist with balance and progressed to min assist to min guard Postural control: Posterior lean;Right lateral lean                                 ADL either performed or assessed with clinical judgement   ADL Overall ADL's : Needs assistance/impaired     Grooming: Wash/dry face;Minimal assistance;Sitting Grooming Details (indicate cue type and reason): performed sitting on EOB with min assist for balance                               General ADL Comments: patient sat on EOB for grooming with assistance for balance    Extremity/Trunk Assessment Upper Extremity Assessment RUE Deficits / Details: 3+/5 grip 3/5 to wrist and forearm 2/5 elbow and trance to shoulder RUE Sensation: decreased light  touch RUE Coordination: decreased fine motor;decreased gross motor            Vision       Perception     Praxis      Cognition Arousal/Alertness: Awake/alert Behavior During Therapy: WFL for tasks assessed/performed Overall Cognitive Status: Impaired/Different from baseline Area of Impairment: Awareness;Problem  solving;Following commands;Attention                 Orientation Level: Person;Place;Time Current Attention Level: Sustained   Following Commands: Follows one step commands with increased time   Awareness: Emergent Problem Solving: Requires verbal cues;Slow processing General Comments: pleasant and motivated towards therapy          Exercises     Shoulder Instructions       General Comments      Pertinent Vitals/ Pain       Pain Assessment: Faces Faces Pain Scale: Hurts little more Pain Location: left lower leg Pain Descriptors / Indicators: Discomfort;Grimacing Pain Intervention(s): Monitored during session;Repositioned  Home Living                                          Prior Functioning/Environment              Frequency  Min 2X/week        Progress Toward Goals  OT Goals(current goals can now be found in the care plan section)  Progress towards OT goals: Progressing toward goals  Acute Rehab OT Goals Patient Stated Goal: get better OT Goal Formulation: With patient Time For Goal Achievement: 08/09/21 Potential to Achieve Goals: Good ADL Goals Pt Will Perform Grooming: with supervision;sitting Pt Will Perform Upper Body Bathing: with min assist;sitting Pt Will Perform Upper Body Dressing: with min assist;sitting Pt Will Transfer to Toilet: with min assist;with +2 assist;squat pivot transfer Pt/caregiver will Perform Home Exercise Program: Increased strength;Right Upper extremity;With minimal assist Additional ADL Goal #1: Patient will sit edge of bed with supervision for up to 10 min to increase UB ADL independence.  Plan Discharge plan remains appropriate    Co-evaluation                 AM-PAC OT "6 Clicks" Daily Activity     Outcome Measure   Help from another person eating meals?: A Little Help from another person taking care of personal grooming?: A Lot Help from another person toileting, which includes  using toliet, bedpan, or urinal?: Total Help from another person bathing (including washing, rinsing, drying)?: Total Help from another person to put on and taking off regular upper body clothing?: A Lot Help from another person to put on and taking off regular lower body clothing?: Total 6 Click Score: 10    End of Session Equipment Utilized During Treatment: Oxygen  OT Visit Diagnosis: Muscle weakness (generalized) (M62.81);Hemiplegia and hemiparesis Hemiplegia - Right/Left: Right Hemiplegia - dominant/non-dominant: Dominant Hemiplegia - caused by: Nontraumatic intracerebral hemorrhage;Cerebral infarction   Activity Tolerance Patient tolerated treatment well   Patient Left in bed;with call bell/phone within reach;with bed alarm set   Nurse Communication Mobility status        Time: 2542-7062 OT Time Calculation (min): 31 min  Charges: OT General Charges $OT Visit: 1 Visit OT Treatments $Therapeutic Activity: 8-22 mins $Neuromuscular Re-education: 8-22 mins  Lodema Hong, [place]  Pager [phone] Office [place] 07/30/2021, 1:31 PM

## 2021-07-30 NOTE — Progress Notes (Signed)
PROGRESS NOTE  Darius Ingram  DOB: 1956/03/08  PCP: Horald Pollen, MD WPY:[id number]  DOA: 07/25/2021  LOS: 5 days  Hospital Day: 6  Chief Complaint  Patient presents with   Code Stroke    Brief narrative: Darius Ingram is a 66 y.o. male with PMH significant for DM2, HTN, HLD, A. fib on Eliquis, hypothyroidism. Patient presented to the ED on 12/29 after a fall with right-sided weakness, slurred speech, right facial weakness. Initial CT scan of head showed a 1.8 cm acute intraparenchymal hematoma at the left internal capsule. MRI brain showed a possible increase in the IPH and a small acute infarct in the left frontal lobe Patient was given Andexxa to reverse Eliquis coagulopathy He was admitted to neuro ICU with nicardipine drip. With clinical improvement, patient was transferred out on 12/31.  Subjective: Patient was seen and examined this morning.  Lying on bed.  Not in distress.  No new symptoms.  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 fib with Eliquis failure.  MRI brain also showed an acute small infarct in the left frontal lobe. -He was treated with adnexa for Eliquis reversal. -His current deficits include right hemiparesis. -Repeat CT head this morning on 1/1 showed stable infarcts and hemorrhages in the left hemisphere. -Currently Eliquis is on hold.  Per neurology note, may consider resuming anticoagulation once ICH resolves.  Since this is considered as Eliquis failure leading to ischemic infarcts and possible hemorrhagic conversion, it should be replaced by another oral anticoagulant.  Per my discussion with neurologist Dr. Leonie Man, will start the patient on Pradaxa after 2 weeks of ICH. -SNF pending.  A.  Fib -Rate controlled on Lopressor. -Anticoagulation plan as above.  Essential hypertension -Continue Lopressor, lisinopril, Norvasc  Type 2 diabetes mellitus -A1c 6.3 on 07/25/2021 -Home meds include Trulicity, metformin -Currently on sliding scale insulin with Accu-Cheks Recent Labs  Lab 07/29/21 0655 07/29/21 1220 07/29/21 1611 07/29/21 2137 07/30/21 0626  GLUCAP 130* 196* 159* 145* 136*    Dyslipidemia -Continue Lipitor  Pressure injury of skin -Stage 1 sacral pressure ulcer appreciated on admission -Needs monitoring  Morbid obesity  -Body mass index is 44.76 kg/m. Patient has been advised to make an attempt to improve diet and exercise patterns to aid in weight loss.  Mobility: PT eval Living condition: Was living alone at home Goals of care:   Code Status: Full Code  Nutritional status: Body mass index is 44.76 kg/m.      Diet:  Diet Order             Diet Heart Room service appropriate? Yes with Assist; Fluid consistency: Thin  Diet effective now                  DVT prophylaxis:  SCD's Start: 07/25/21 0959   Antimicrobials: None Fluid: None Consultants: Neurology Family Communication: None at bedside  Status is: Inpatient  Continue in-hospital care because: Pending SNF Level of care: Telemetry Medical   Dispo: The patient is from: Home              Anticipated d/c is to: SNF              Patient currently is medically stable to d/c.   Difficult to place patient No   Infusions:  Scheduled Meds:  amLODipine  10 mg Oral Daily   insulin aspart  0-20 Units Subcutaneous TID WC   lisinopril  20 mg Oral Daily   mouth rinse  15 mL Mouth Rinse BID   metoprolol tartrate  25 mg Oral BID   pantoprazole  40 mg Oral QHS   senna-docusate  1 tablet Oral BID    PRN meds: acetaminophen **OR** acetaminophen (TYLENOL) oral liquid 160 mg/5 mL **OR** acetaminophen, acetaminophen-codeine, labetalol   Antimicrobials: Anti-infectives (From  admission, onward)    None       Objective: Vitals:   07/30/21 0341 07/30/21 0724  BP: (!) 154/88 119/80  Pulse: (!) 59 84  Resp: 19   Temp: 98.2 F (36.8 C) 97.7 F (36.5 C)  SpO2: 95% 98%    Intake/Output Summary (Last 24 hours) at 07/30/2021 1051 Last data filed at 07/30/2021 1016 Gross per 24 hour  Intake 360 ml  Output 600 ml  Net -240 ml   Filed Weights   07/25/21 0900 07/25/21 0936  Weight: (!) 152.6 kg (!) 149.7 kg   Weight change:  Body mass index is 44.76 kg/m.   Physical Exam: General exam: Pleasant, elderly Caucasian male.  Not in distress.  No new symptoms Skin: No rashes, lesions or ulcers. HEENT: Atraumatic, normocephalic, no obvious bleeding Lungs: Clear to auscultation bilaterally CVS: Regular rate and rhythm, no murmur GI/Abd soft, nontender nondistended, bowels are present CNS: Alert, awake, oriented x3, right hemiparesis, gradually improving Psychiatry: Mood appropriate Extremities: No pedal edema, no calf tenderness  Data Review: I have personally reviewed the laboratory data and studies available.  F/u labs ordered Unresulted Labs (From admission, onward)     Start     Ordered   07/30/21 1018  Resp Panel by RT-PCR (Flu A&B, Covid) Nasopharyngeal Swab  (Tier 2 - Symptomatic/asymptomatic)  ONCE - STAT,   STAT        07/30/21 1017            Signed, Terrilee Croak, MD [facility] 07/30/2021

## 2021-07-30 NOTE — Plan of Care (Signed)
°  Problem: Education: Goal: Knowledge of disease or condition will improve Outcome: Progressing Goal: Knowledge of secondary prevention will improve (SELECT ALL) Outcome: Progressing Goal: Knowledge of patient specific risk factors will improve (INDIVIDUALIZE FOR PATIENT) Outcome: Progressing   Problem: Self-Care: Goal: Ability to participate in self-care as condition permits will improve Outcome: Progressing Goal: Verbalization of feelings and concerns over difficulty with self-care will improve Outcome: Progressing Goal: Ability to communicate needs accurately will improve Outcome: Progressing   Problem: Ischemic Stroke/TIA Tissue Perfusion: Goal: Complications of ischemic stroke/TIA will be minimized Outcome: Progressing

## 2021-07-30 NOTE — Progress Notes (Signed)
PROGRESS NOTE  Darius Ingram  DOB: October 06, 1955  PCP: Horald Pollen, MD GEZ:[id number]  DOA: 07/25/2021  Chief Complaint  Patient presents with   Code Stroke    Brief narrative: Darius Ingram is a 66 y.o. male with PMH significant for DM2, HTN, HLD, A. fib on Eliquis, hypothyroidism. Patient presented to the ED on 12/29 after a fall with right-sided weakness, slurred speech, right facial weakness. Initial CT scan of head showed a 1.8 cm acute intraparenchymal hematoma at the left internal capsule. MRI brain showed a possible increase in the IPH and a small acute infarct in the left frontal lobe Patient was given Andexxa to reverse Eliquis coagulopathy He was admitted to neuro ICU with nicardipine drip. With clinical improvement, patient was transferred out on 12/31.  Subjective: Patient was seen and examined this morning.  Pleasant elderly Caucasian male.  Lying on bed.  Not in distress.  No new symptoms.  Was being worked up by physical therapy Waiting for SNF.  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 fib with Eliquis failure.  MRI brain also showed an acute small infarct in the left frontal lobe. -He was treated with adnexa for Eliquis reversal. -His current deficits include right hemiparesis. -Repeat CT head this morning on 1/1 showed stable infarcts and hemorrhages in the left hemisphere. -Currently Eliquis is on hold.  Per neurology note, may consider resuming anticoagulation once ICH resolves.  Since this is considered as Eliquis failure leading to ischemic infarcts and possible hemorrhagic conversion, it should be replaced by another oral anticoagulant. -SNF pending.  A. Fib -Rate controlled on  Lopressor. -Previously on Eliquis, currently on hold.  Ultimately would require another anticoagulation after ICH resolves.  Essential hypertension -Continue Lopressor, lisinopril, Norvasc  Type 2 diabetes mellitus -A1c 6.3 on 07/25/2021 -Home meds include Trulicity, metformin -Currently on sliding scale insulin with Accu-Cheks  Dyslipidemia -Continue Lipitor  Pressure injury of skin -Stage 1 sacral pressure ulcer appreciated on admission -Needs monitoring  Morbid obesity  -Body mass index is 44.76 kg/m. Patient has been advised to make an attempt to improve diet and exercise patterns to aid in weight loss.  Mobility: PT eval Living condition: Was living alone at home Goals of care:   Code Status: Full Code  Nutritional status: Body mass index is 44.76 kg/m.      Diet:  Diet Order             Diet Heart Room service appropriate? Yes with Assist; Fluid consistency: Thin  Diet effective now                  DVT prophylaxis:  SCD's Start: 07/25/21 0959   Antimicrobials: None Fluid: None Consultants: Neurology Family Communication: None at bedside  Status is: Inpatient  Continue in-hospital care because: Pending SNF Level of care: Telemetry Medical   Dispo: The patient is from: Home              Anticipated d/c is to: SNF              Patient currently is not medically stable to d/c.   Difficult to place patient No     Infusions:    Scheduled Meds:  amLODipine  10 mg Oral Daily   insulin aspart  0-20 Units Subcutaneous TID WC  lisinopril  20 mg Oral Daily   mouth rinse  15 mL Mouth Rinse BID   metoprolol tartrate  25 mg Oral BID   pantoprazole  40 mg Oral QHS   senna-docusate  1 tablet Oral BID    PRN meds: acetaminophen **OR** acetaminophen (TYLENOL) oral liquid 160 mg/5 mL **OR** acetaminophen, acetaminophen-codeine, labetalol   Antimicrobials: Anti-infectives (From admission, onward)    None      Filed Weights   07/25/21 0900  07/25/21 0936  Weight: (!) 152.6 kg (!) 149.7 kg   Weight change:  Body mass index is 44.76 kg/m.   Physical Exam: General exam: Pleasant, elderly Caucasian male.  Not in distress.   Skin: No rashes, lesions or ulcers. HEENT: Atraumatic, normocephalic, no obvious bleeding Lungs: Clear to auscultation bilaterally CVS: Regular rate and rhythm, no murmur GI/Abd soft, nontender nondistended, bowels are present CNS: Alert, awake, oriented x3, right hemiparesis persists. Psychiatry: Mood appropriate Extremities: No pedal edema, no calf tenderness  Data Review: I have personally reviewed the laboratory data and studies available.   Signed, Terrilee Croak, MD [facility] 07/29/2021

## 2021-07-30 NOTE — Progress Notes (Addendum)
STROKE TEAM PROGRESS NOTE   INTERVAL HISTORY Patient seen in room working with his occupational therapist.   He is sitting in bed comfortably.  Continues to have mild right-sided weakness and dysarthria which is slightly improved.  Blood pressure adequately controlled.  He is awaiting transfer to  rehab next few days.  No new complaints.  Vital signs stable.     Vitals:   07/29/21 2342 07/30/21 0341 07/30/21 0724 07/30/21 1119  BP: (!) 134/91 (!) 154/88 119/80 131/89  Pulse: (!) 108 (!) 59 84 81  Resp: 18 19  20   Temp: 98 F (36.7 C) 98.2 F (36.8 C) 97.7 F (36.5 C) 98.2 F (36.8 C)  TempSrc:  Oral Oral Oral  SpO2: 94% 95% 98% 100%  Weight:      Height:       CBC:  Recent Labs  Lab 07/25/21 0909 07/25/21 0911 07/29/21 0233 07/30/21 0242  WBC 12.2*   < > 14.5* 13.8*  NEUTROABS 8.6*  --   --   --   HGB 15.0   < > 13.9 13.9  HCT 43.4   < > 41.3 41.9  MCV 87.7   < > 87.9 89.1  PLT 235   < > 234 230   < > = values in this interval not displayed.   Basic Metabolic Panel:  Recent Labs  Lab 07/25/21 1030 07/26/21 0453 07/27/21 0333 07/28/21 0135 07/29/21 0233 07/30/21 0242  NA  --  139 140   < > 141 138  K  --  3.2* 3.5   < > 3.5 3.6  CL  --  106 106   < > 105 105  CO2  --  24 24   < > 26 25  GLUCOSE  --  135* 132*   < > 145* 148*  BUN  --  22 24*   < > 17 22  CREATININE  --  1.13 1.17   < > 0.93 0.96  CALCIUM  --  8.5* 8.8*   < > 8.4* 8.3*  MG 1.3* 2.3 1.8  --   --   --   PHOS 2.8  --   --   --   --   --    < > = values in this interval not displayed.   Lipid Panel:  Recent Labs  Lab 07/25/21 1030  CHOL 123  TRIG 96  HDL 29*  CHOLHDL 4.2  VLDL 19  LDLCALC 75   HgbA1c:  Recent Labs  Lab 07/25/21 1030  HGBA1C 6.3*   Urine Drug Screen: No results for input(s): LABOPIA, COCAINSCRNUR, LABBENZ, AMPHETMU, THCU, LABBARB in the last 168 hours.  Alcohol Level  Recent Labs  Lab 07/25/21 1030  ETH <10    IMAGING past 24 hours No results  found.  PHYSICAL EXAM  Temp:  [97.7 F (36.5 C)-99.1 F (37.3 C)] 98.2 F (36.8 C) (01/03 1119) Pulse Rate:  [59-108] 81 (01/03 1119) Resp:  [18-20] 20 (01/03 1119) BP: (119-154)/(63-92) 131/89 (01/03 1119) SpO2:  [94 %-100 %] 100 % (01/03 1119)  General -obese middle-aged Caucasian male d, in no apparent distress. Ophthalmologic - fundi not visualized due to noncooperation. Cardiovascular - Regular rhythm and rate.  Mental Status -  Level of arousal and orientation to time, place, and person were intact. Dysarthric, impaired articulation   Cranial Nerves II - XII - II - Visual field intact OU. III, IV, VI - Extraocular movements intact. V - Facial sensation intact bilaterally. VII -slight  lower right facial asymmetry, right droop VIII - Hearing & vestibular intact bilaterally. X - Palate elevates symmetrically. XI - Chin turning & shoulder shrug intact bilaterally. XII - Tongue protrusion intact.  Motor Strength - The patients strength was normal in all extremities and pronator drift was absent.  Bulk was normal and fasciculations were absent.   RUE weak 0/5 proximally, able to move hand and fingers 2/5 LUE- 5/5 RLE- 2/5 moves with gravity eliminated LLE-4/5 Motor Tone - Muscle tone was assessed at the neck and appendages and was normal. Reflexes - The patients reflexes were symmetrical in all extremities and he had no pathological reflexes. Sensory - diminished sensation in the right upper and lower extremities Coordination - The patient had normal movements in the hands and feet with no ataxia or dysmetria.  Tremor was absent. Gait and Station - deferred.   ASSESSMENT/PLAN [name] is a 66 y.o. male with history of DM2, HLD, HTN, Hypothyroidism, joint pain, and afib on elquis with no missed doses presenting after a fall with right side weakness, slurred speech, right facial weakness starting 12/29 at 0715.  Received andexxa to reverse eliquis  coagulopathy.  Initial CT showed a 1.8 cm acute intraparenchymal hematoma at the left internal capsule.  MRI today showed a possible increase in the IPH and small acute infarcts in the left frontal lobe.  LDL is 75 and hemoglobin A1c was 6.3.  Physical therapy recommends SNF and speech therapy recommends acute inpatient rehab.  Home HTN medications resumed and cleviprex is off. Plan to on board hospitalist and move out of ICU today. Transfer orders placed.   ICH vs. Hemorrhagic conversion - left BG small hematoma either due to eliquis use in the setting of hypertension or due to hemorrhagic conversion from afib failed eliquis Additional ischemic strokes - left frontal scattered small infarcts, embolic, could be due to afib failed eliquis Code Stroke CT - 1.8 cm acute intraparenchymal hematoma centered in the left internal capsule with mild surrounding edema but no significant mass effect, and smaller adjacent hematoma in the distal left caudate. CTA head no evidence of active extravasation into the intraparenchymal hematoma, no evidence of AVM. 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 Severe stenosis at the origin of the right A1 segment and mild atherosclerotic irregularity of the distal MCA and PCA branches MRI - Acute left thalamocapsular IPH measuring up to approximately 2.0 cm. Small acute infarcts in the left frontal lobe. Carotid Doppler unremarkable 2D Echo EF 55-60%, LV normal function  LDL 75 HgbA1c 6.3 VTE prophylaxis - SCDs Eliquis (apixaban) daily prior to admission, now on No antithrombotic due to [place].  May consider resume anticoagulation once ICH resolves in 2 weeks.  May consider Pradaxa to replace eliquis at that time given likely Eliquis failure. Therapy recommendations: SNF  Disposition:  Pending  Atrial fibrillation Previously on Eliquis 5 mg twice daily Hold off AC in the setting of hemorrhage Rate controlled with metoprolol 12.5 twice  daily ->25mg  bid May consider anticoagulation with Pradaxa once hematoma resolves, in 2 weeks however, will start aspirin 81 mg daily now and will discontinue it once Pradaxa restarted.  Hypertension Home meds:  lisinopril,  amlodipine 10 mg Resume home medications Long-term BP goal normotensive BP goal now < 160 On Cleviprex IV, taper off as able On home lisinopril 20, amlodipine 10, increase metoprolol to 25 twice daily PRN labelotol  Hyperlipidemia Home meds:  atorvastatin 40mg  LDL 75, goal < 70 Hold in  the setting of IPH Resume statin on discharge  Diabetes type II Controlled Home meds: Trulicity, metformin [id number] 6.3, goal < 7.0 CBGs SSI Close PCP follow-up as outpatient  Other Stroke Risk Factors Advanced Age >/= 66  Former Cigarette smoker, quit 22 years ago obesity, Body mass index is 44.76 kg/m., BMI >/= 30 associated with increased stroke risk, recommend weight loss, diet and exercise as appropriate   Other Active Problems Hypomagnesemia 1.4-> replaced with 4 mg IV Repeat 2.3 ->1.8  Hospital day # 5    Continue aggressive blood pressure control systolic goal below 676.  Mobilize out of bed.  Continue ongoing therapies.  Patient medically stable to be transferred to rehab when bed available in the next few days.  Start aspirin 81 mg daily now and discontinue it when starting Pradaxa in 2 weeks following his intracerebral hemorrhage for secondary stroke prevention given his A. fib as he has failed Eliquis will not resume it.  Greater than 50% time during this 35-minute visit was spent in counseling and coordination of care and discussion with patient and care team and answering questions.  Discussed with Dr.Dahal.  Stroke team will sign off.  Kindly call for questions Antony Contras, MD Medical Director [place] Pager: [phone] 07/30/2021 2:55 PM    To contact Stroke Continuity provider, please refer to [email or web address] After hours, contact General  Neurology

## 2021-07-30 NOTE — TOC Progression Note (Signed)
Transition of Care [facility]) - Progression Note    Patient Details  Name: Daquawn Seelman MRN: [id number] Date of Birth: 1956/03/13  Transition of Care [facility]) CM/SW [place], [place] Phone Number: 07/30/2021, 3:53 PM  Clinical Narrative:   CSW secured bed at [facility] for patient today, and received insurance authorization. Attempting to reach patient's sister to sign consents, and unable to get the patient's sister on the phone. CSW called and left a voicemail, called throughout the day with no answer. CSW to follow.    Expected Discharge Plan: Skilled Nursing Facility Barriers to Discharge: Other (must enter comment) (unable to reach family)  Expected Discharge Plan and Services Expected Discharge Plan: [place] In-house Referral: Clinical Social Work Discharge Planning Services: CM Consult Post Acute Care Choice: [place] arrangements for the past 2 months: Single Family Home                                       Social Determinants of Health (SDOH) Interventions    Readmission Risk Interventions No flowsheet data found.

## 2021-07-31 DIAGNOSIS — E119 Type 2 diabetes mellitus without complications: Secondary | ICD-10-CM | POA: Diagnosis not present

## 2021-07-31 DIAGNOSIS — E785 Hyperlipidemia, unspecified: Secondary | ICD-10-CM | POA: Diagnosis not present

## 2021-07-31 DIAGNOSIS — R4781 Slurred speech: Secondary | ICD-10-CM | POA: Diagnosis not present

## 2021-07-31 DIAGNOSIS — I619 Nontraumatic intracerebral hemorrhage, unspecified: Secondary | ICD-10-CM | POA: Diagnosis not present

## 2021-07-31 DIAGNOSIS — G8191 Hemiplegia, unspecified affecting right dominant side: Secondary | ICD-10-CM | POA: Diagnosis not present

## 2021-07-31 DIAGNOSIS — I10 Essential (primary) hypertension: Secondary | ICD-10-CM | POA: Diagnosis not present

## 2021-07-31 DIAGNOSIS — R278 Other lack of coordination: Secondary | ICD-10-CM | POA: Diagnosis not present

## 2021-07-31 DIAGNOSIS — E1169 Type 2 diabetes mellitus with other specified complication: Secondary | ICD-10-CM | POA: Diagnosis not present

## 2021-07-31 DIAGNOSIS — M6281 Muscle weakness (generalized): Secondary | ICD-10-CM | POA: Diagnosis not present

## 2021-07-31 DIAGNOSIS — I4811 Longstanding persistent atrial fibrillation: Secondary | ICD-10-CM | POA: Diagnosis not present

## 2021-07-31 DIAGNOSIS — E1165 Type 2 diabetes mellitus with hyperglycemia: Secondary | ICD-10-CM | POA: Diagnosis not present

## 2021-07-31 DIAGNOSIS — Z743 Need for continuous supervision: Secondary | ICD-10-CM | POA: Diagnosis not present

## 2021-07-31 DIAGNOSIS — I739 Peripheral vascular disease, unspecified: Secondary | ICD-10-CM | POA: Diagnosis not present

## 2021-07-31 DIAGNOSIS — I639 Cerebral infarction, unspecified: Secondary | ICD-10-CM | POA: Diagnosis not present

## 2021-07-31 DIAGNOSIS — R262 Difficulty in walking, not elsewhere classified: Secondary | ICD-10-CM | POA: Diagnosis not present

## 2021-07-31 DIAGNOSIS — M2550 Pain in unspecified joint: Secondary | ICD-10-CM | POA: Diagnosis not present

## 2021-07-31 DIAGNOSIS — K5900 Constipation, unspecified: Secondary | ICD-10-CM | POA: Diagnosis not present

## 2021-07-31 DIAGNOSIS — I69359 Hemiplegia and hemiparesis following cerebral infarction affecting unspecified side: Secondary | ICD-10-CM | POA: Diagnosis not present

## 2021-07-31 DIAGNOSIS — R2681 Unsteadiness on feet: Secondary | ICD-10-CM | POA: Diagnosis not present

## 2021-07-31 DIAGNOSIS — E039 Hypothyroidism, unspecified: Secondary | ICD-10-CM | POA: Diagnosis not present

## 2021-07-31 DIAGNOSIS — G459 Transient cerebral ischemic attack, unspecified: Secondary | ICD-10-CM | POA: Diagnosis not present

## 2021-07-31 DIAGNOSIS — I4891 Unspecified atrial fibrillation: Secondary | ICD-10-CM | POA: Diagnosis not present

## 2021-07-31 DIAGNOSIS — J309 Allergic rhinitis, unspecified: Secondary | ICD-10-CM | POA: Diagnosis not present

## 2021-07-31 DIAGNOSIS — K219 Gastro-esophageal reflux disease without esophagitis: Secondary | ICD-10-CM | POA: Diagnosis not present

## 2021-07-31 LAB — GLUCOSE, CAPILLARY
Glucose-Capillary: 133 mg/dL — ABNORMAL HIGH (ref 70–99)
Glucose-Capillary: 218 mg/dL — ABNORMAL HIGH (ref 70–99)

## 2021-07-31 MED ORDER — ACETAMINOPHEN 325 MG PO TABS: 650.0000 mg | ORAL_TABLET | ORAL | Status: AC | PRN

## 2021-07-31 MED ORDER — SENNOSIDES-DOCUSATE SODIUM 8.6-50 MG PO TABS: 1.0000 | ORAL_TABLET | Freq: Two times a day (BID) | ORAL | Status: AC

## 2021-07-31 MED ORDER — METOPROLOL TARTRATE 25 MG PO TABS: 25.0000 mg | ORAL_TABLET | Freq: Two times a day (BID) | ORAL | Status: AC

## 2021-07-31 MED ORDER — INSULIN ASPART 100 UNIT/ML IJ SOLN: 0.0000 [IU] | Freq: Three times a day (TID) | INTRAMUSCULAR | 11 refills | Status: DC

## 2021-07-31 MED ORDER — PANTOPRAZOLE SODIUM 40 MG PO TBEC: 40.0000 mg | DELAYED_RELEASE_TABLET | Freq: Every day | ORAL | Status: AC

## 2021-07-31 MED ORDER — DABIGATRAN ETEXILATE MESYLATE 110 MG PO CAPS
110.0000 mg | ORAL_CAPSULE | Freq: Two times a day (BID) | ORAL | Status: DC
Start: 2021-07-31 — End: 2022-02-20

## 2021-07-31 MED ORDER — ASPIRIN 81 MG PO TBEC: 81.0000 mg | DELAYED_RELEASE_TABLET | Freq: Every day | ORAL | Status: AC

## 2021-07-31 NOTE — TOC Transition Note (Signed)
Transition of Care [facility]) - CM/SW Discharge Note   Patient Details  Name: Darius Ingram MRN: [id number] Date of Birth: 10/08/1955  [facility] of [facility]) CM/SW Contact:  Geralynn Ochs, LCSW Phone Number: 07/31/2021, 12:03 PM   Clinical Narrative:   Nurse to call report to [phone], Room 3205.  Transport scheduled for 2:00 PM.    Final next level of care: [place] Barriers to Discharge: Barriers Resolved   Patient Goals and CMS Choice Patient states their goals for this hospitalization and ongoing recovery are:: Get rehab so he can go back home CMS Medicare.gov Compare Post Acute Care list provided to:: Patient Choice offered to / list presented to : Patient  Discharge Placement              Patient chooses bed at: [facility] Patient to be transferred to facility by: [facility] Name of family member notified: [place] Patient and family notified of of transfer: 07/31/21  Discharge Plan and Services In-house Referral: Clinical Social Work Discharge Planning Services: [employer] Consult Post Acute Care Choice: [place]                               Social Determinants of Health (SDOH) Interventions     Readmission Risk Interventions No flowsheet data found.

## 2021-07-31 NOTE — Plan of Care (Signed)
  Problem: Education: Goal: Knowledge of disease or condition will improve Outcome: Progressing Goal: Knowledge of secondary prevention will improve (SELECT ALL) Outcome: Progressing Goal: Knowledge of patient specific risk factors will improve (INDIVIDUALIZE FOR PATIENT) Outcome: Progressing Goal: Individualized Educational Video(s) Outcome: Progressing   Problem: Education: Goal: Knowledge of General Education information will improve Description: Including pain rating scale, medication(s)/side effects and non-pharmacologic comfort measures Outcome: Progressing   Problem: Health Behavior/Discharge Planning: Goal: Ability to manage health-related needs will improve Outcome: Progressing   Problem: Clinical Measurements: Goal: Ability to maintain clinical measurements within normal limits will improve Outcome: Progressing Goal: Will remain free from infection Outcome: Progressing Goal: Diagnostic test results will improve Outcome: Progressing   Problem: Elimination: Goal: Will not experience complications related to bowel motility Outcome: Progressing Goal: Will not experience complications related to urinary retention Outcome: Progressing   Problem: Self-Care: Goal: Ability to participate in self-care as condition permits will improve Outcome: Progressing Goal: Verbalization of feelings and concerns over difficulty with self-care will improve Outcome: Progressing Goal: Ability to communicate needs accurately will improve Outcome: Progressing   Problem: Ischemic Stroke/TIA Tissue Perfusion: Goal: Complications of ischemic stroke/TIA will be minimized Outcome: Progressing

## 2021-07-31 NOTE — Plan of Care (Signed)
°  Problem: Education: Goal: Knowledge of disease or condition will improve Outcome: Progressing Goal: Knowledge of secondary prevention will improve (SELECT ALL) Outcome: Progressing Goal: Knowledge of patient specific risk factors will improve (INDIVIDUALIZE FOR PATIENT) Outcome: Progressing   Problem: Ischemic Stroke/TIA Tissue Perfusion: Goal: Complications of ischemic stroke/TIA will be minimized Outcome: Progressing   

## 2021-07-31 NOTE — Discharge Summary (Signed)
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 male with PMH significant for DM2, HTN, HLD, A. fib on Eliquis, hypothyroidism. Patient presented to the ED on 12/29 after a fall with right-sided weakness, slurred speech, right facial weakness. Initial CT scan of head showed a 1.8 cm acute intraparenchymal hematoma at the left internal capsule. MRI brain showed a possible increase in the IPH and a small acute infarct in the left frontal lobe Patient was given Andexxa to reverse Eliquis coagulopathy He was admitted to neuro ICU with nicardipine drip. With clinical improvement, patient was transferred out on 12/31.  Subjective: Patient was seen and examined this morning.  Lying on bed.  Not in distress.  No new symptoms.  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 fib with Eliquis failure.  MRI brain also showed an acute small infarct in the left frontal lobe. -He was treated with adnexa for Eliquis reversal. -His current deficits include right hemiparesis. -Repeat CT head this morning on 1/1  showed stable infarcts and hemorrhages in the left hemisphere. -Currently Eliquis is on hold.  Per neurology note, consider resuming anticoagulation once ICH resolves.  Since this is considered as Eliquis failure leading to ischemic infarcts and possible hemorrhagic conversion, it should be replaced by another oral anticoagulant.  Per my discussion with neurologist Dr. Leonie Man, will start the patient on Pradaxa after 2 weeks of ICH.  So the patient should be started on Pradaxa starting 08/09/2021.  Because of his high risk profile for bleeding, based on up-to-date recommendation, I have started him on a low-dose of Pradaxa at 110 mg twice daily.  Can be on baby aspirin till then. -Resume statin at discharge  A. Fib -Rate controlled on Lopressor. -Anticoagulation plan as above.  Essential hypertension -Continue Lopressor, lisinopril, Norvasc  Type 2 diabetes mellitus -A1c 6.3 on 07/25/2021 -Home meds include Trulicity, metformin -Continue same post discharge along with sliding cell insulin Recent Labs  Lab 07/30/21 0626 07/30/21 1211 07/30/21 1616 07/30/21 2118 07/31/21 0604  GLUCAP 136* 151* 161* 137* 133*   Dyslipidemia -Continue Lipitor  Pressure injury of skin -Stage 1 sacral pressure ulcer appreciated on admission  Morbid obesity  -Body mass index is 44.76 kg/m. Patient has been advised to make an attempt to improve diet and exercise patterns to aid in weight loss.  Mobility: PT eval Living condition: Was living alone at home Goals of care:   Code Status: Full Code  Nutritional status: Body mass index is 44.76 kg/m.      Discharge Medications:   Allergies as of 07/31/2021       Reactions   Penicillins Rash   Reaction:  50 years ago        Medication List     STOP taking these medications    apixaban 5 MG Tabs tablet Commonly known as: Eliquis       TAKE these medications    acetaminophen 650 MG CR tablet Commonly known as: TYLENOL Take 650 mg by mouth  daily. What changed: Another medication with the same name was added. Make sure you understand how and when to take each.   acetaminophen 325 MG tablet Commonly known as: TYLENOL Take 2 tablets (650 mg total) by mouth every 4 (four) hours as needed for mild pain (or temp > 37.5 C (99.5 F)). What changed: You were already taking a medication with the same name, and this prescription was added. Make sure you understand how and when to take each.   amLODipine 10 MG tablet Commonly known as: NORVASC Take 1 tablet (10 mg total) by mouth daily.   aspirin 81 MG EC tablet Take 1 tablet (81 mg total) by mouth daily for 7 days. Swallow whole. Start taking on: August 01, 2021   atorvastatin 40 MG tablet Commonly known as: LIPITOR Take 1 tablet (40 mg total) by mouth daily.   calcium carbonate 750 MG chewable tablet Commonly known as: TUMS EX Chew 750-1,500 mg by mouth daily as needed for heartburn.   cetirizine 10 MG tablet Commonly known as: ZYRTEC Take 1 tablet (10 mg total) by mouth daily.   Dabigatran Etexilate Mesylate 110 MG Caps Commonly known as: Pradaxa Take 110 mg by mouth 2 (two) times daily.   fluticasone 50 MCG/ACT nasal spray Commonly known as: FLONASE Place 2 sprays into both nostrils daily. What changed:  when to take this reasons to take this   insulin aspart 100 UNIT/ML injection Commonly known as: novoLOG Inject 0-20 Units into the skin 3 (three) times daily with meals.   lisinopril 20 MG tablet Commonly known as: ZESTRIL Take 1 tablet (20 mg total) by mouth daily.   metFORMIN 1000 MG tablet Commonly known as: GLUCOPHAGE Take 1 tablet (1,000 mg total) by mouth 2 (two) times daily with a meal.   metoprolol tartrate 25 MG tablet Commonly known as: LOPRESSOR Take 1 tablet (25 mg total) by mouth 2 (two) times daily.   MULTIVITAMIN PO Take 1 tablet by mouth daily.   pantoprazole 40 MG tablet Commonly known as: PROTONIX Take 1 tablet (40 mg total) by  mouth at bedtime.   senna-docusate 8.6-50 MG tablet Commonly known as: Senokot-S Take 1 tablet by mouth 2 (two) times daily.   Trulicity 1.5 YT/0.1SW Sopn Generic drug: Dulaglutide Inject 1.5 mg into the skin once a week.               Discharge Care Instructions  (From admission, onward)           Start     Ordered   07/31/21 0000  Discharge wound care:        07/31/21 1017            Wound care:   Pressure Injury 07/25/21 Sacrum Mid Stage 1 -  Intact skin with non-blanchable redness of a localized area usually over a bony prominence. Non-blancable redness (Active)  Date First Assessed/Time First Assessed: 07/25/21 1730   Location: Sacrum  Location Orientation: Mid  Staging: Stage 1 -  Intact skin with non-blanchable redness of a localized area usually over a bony prominence.  Wound Description (Comments): Non-bl...    Assessments 07/25/2021  5:30 PM  07/31/2021  8:00 AM  Dressing Type Foam - Lift dressing to assess site every shift Foam - Lift dressing to assess site every shift  Dressing Clean;Dry;Intact Clean;Dry;Intact  Dressing Change Frequency Every 3 days --  Site / Wound Assessment Clean;Dry;Red Clean;Red  Peri-wound Assessment Intact;Erythema (blanchable) --  Margins Attached edges (approximated) --  Drainage Amount None --  Treatment Off loading --     No Linked orders to display    Discharge Instructions:   Discharge Instructions     Call MD for:  difficulty breathing, headache or visual disturbances   Complete by: As directed    Call MD for:  extreme fatigue   Complete by: As directed    Call MD for:  hives   Complete by: As directed    Call MD for:  persistant dizziness or light-headedness   Complete by: As directed    Call MD for:  persistant nausea and vomiting   Complete by: As directed    Call MD for:  severe uncontrolled pain   Complete by: As directed    Call MD for:  temperature >100.4   Complete by: As directed    Diet general    Complete by: As directed    Discharge instructions   Complete by: As directed    Discharge instructions for diabetes mellitus: Check blood sugar 3 times a day and bedtime at home. If blood sugar running above 200 or less than 70 please call your MD to adjust insulin. If you notice signs and symptoms of hypoglycemia (low blood sugar) like jitteriness, confusion, thirst, tremor and sweating, please check blood sugar, drink sugary drink/biscuits/sweets to increase sugar level and call MD or return to ER.    General discharge instructions:  Follow with Primary MD Horald Pollen, MD in 7 days   Get CBC/BMP checked in next visit within 1 week by PCP or SNF MD. (We routinely change or add medications that can affect your baseline labs and fluid status, therefore we recommend that you get the mentioned basic workup next visit with your PCP, your PCP may decide not to get them or add new tests based on their clinical decision)  On your next visit with your PCP, please get your medicines reviewed and adjusted.  Please request your PCP  to go over all hospital tests, procedures, radiology results at the follow up, please get all Hospital records sent to your PCP by signing hospital release before you go home.  Activity: As tolerated with Full fall precautions use walker/cane & assistance as needed  Avoid using any recreational substances like cigarette, tobacco, alcohol, or non-prescribed drug.  If you experience worsening of your admission symptoms, develop shortness of breath, life threatening emergency, suicidal or homicidal thoughts you must seek medical attention immediately by calling 911 or calling your MD immediately  if symptoms less severe.  You must read complete instructions/literature along with all the possible adverse reactions/side effects for all the medicines you take and that have been prescribed to you. Take any new medicine only after you have completely understood and  accepted all the possible adverse reactions/side effects.   Do not drive, operate heavy machinery, perform activities at heights, swimming or participation in water activities or provide baby sitting services if your were admitted for syncope or siezures until you have seen by Primary MD or a Neurologist and advised to do so again.  Do not drive when taking Pain medications.  Do not take more than prescribed Pain,  Sleep and Anxiety Medications  Wear Seat belts while driving.  Please note You were cared for by a hospitalist during your hospital stay. If you have any questions about your discharge medications or the care you received while you were in the hospital after you are discharged, you can call the unit and asked to speak with the hospitalist on call if the hospitalist that took care of you is not available. Once you are discharged, your primary care physician will handle any further medical issues. Please note that NO REFILLS for any discharge medications will be authorized once you are discharged, as it is imperative that you return to your primary care physician (or establish a relationship with a primary care physician if you do not have one) for your aftercare needs so that they can reassess your need for medications and monitor your lab values.   Discharge wound care:   Complete by: As directed    Increase activity slowly   Complete by: As directed        Follow ups:    Follow-up Information     Horald Pollen, MD Follow up.   Specialty: Internal Medicine Contact information: [place] [address] [phone]         Sanda Klein, MD .   Specialty: Cardiology Contact information: [address] [place] [address] [phone]                 Discharge Exam:   Vitals:   07/30/21 2337 07/31/21 0009 07/31/21 0343 07/31/21 0800  BP: (!) 128/107 137/73 (!) 150/95 (!) 149/96  Pulse: 100 94 71 92  Resp: 18  18 (!) 21   Temp: 97.9 F (36.6 C)  98.2 F (36.8 C) 97.8 F (36.6 C)  TempSrc:   Oral Oral  SpO2: 99% 97% 93% 98%  Weight:      Height:        Body mass index is 44.76 kg/m.  General exam: Pleasant, elderly Caucasian male.  Not in distress.  No new symptoms Skin: No rashes, lesions or ulcers. HEENT: Atraumatic, normocephalic, no obvious bleeding Lungs: Clear to auscultation bilaterally CVS: Regular rate and rhythm, no murmur GI/Abd soft, nontender nondistended, bowels are present CNS: Alert, awake, oriented x3, right hemiparesis, gradually improving Psychiatry: Mood appropriate Extremities: No pedal edema, no calf tenderness  Time coordinating discharge: 35 minutes   The results of significant diagnostics from this hospitalization (including imaging, microbiology, ancillary and laboratory) are listed below for reference.    Procedures and Diagnostic Studies:   MR BRAIN WO CONTRAST  Result Date: 07/26/2021 CLINICAL DATA:  Stroke, follow up Hemorrhagic stroke EXAM: MRI HEAD WITHOUT CONTRAST TECHNIQUE: Multiplanar, multiecho pulse sequences of the brain and surrounding structures were obtained without intravenous contrast. COMPARISON:  CT head 07/25/2021 FINDINGS: Brain: Acute left thalamocapsular intraparenchymal hemorrhage measuring up to approximately 2.0 cm, similar versus slightly increased in size relative to recent CT head when comparing across modalities. Mild surrounding edema. There is some surrounding diffusion signal abnormality; however, this could be related to the hemorrhage. No evidence of a mass; however, acute blood products limit evaluation. Many small acute infarcts in the left frontal lobe. Additional scattered T2/FLAIR hyperintensities in the white matter, nonspecific but compatible with chronic microvascular ischemic disease. Small remote infarct in the right thalamus. No hydrocephalus, midline shift, or extra-axial fluid collection. Vascular: Major arterial flow voids are  maintained at the skull base. Skull and upper cervical spine: Normal marrow signal. Sinuses/Orbits: Mild  paranasal sinus mucosal thickening. Unremarkable orbits. Other: No mastoid effusions. IMPRESSION: 1. Acute left thalamocapsular intraparenchymal hemorrhage measuring up to approximately 2.0 cm, similar versus slightly increased in size relative to recent CT head when comparing across modalities. 2. Many small acute infarcts in the left frontal lobe. Electronically Signed   By: Margaretha Sheffield M.D.   On: 07/26/2021 13:22   Chest Port 1 View  Result Date: 07/25/2021 CLINICAL DATA:  Stroke, hemorrhagic. EXAM: PORTABLE CHEST 1 VIEW COMPARISON:  None. FINDINGS: 1015 hours. Two views submitted. The heart appears mildly enlarged. There is vascular congestion and mild bibasilar atelectasis, but no confluent airspace opacity, edema, significant pleural effusion or pneumothorax. Mild degenerative changes are present within the spine. No acute osseous findings are evident. Telemetry leads overlie the chest. IMPRESSION: Cardiomegaly with mild vascular congestion and basilar atelectasis. Electronically Signed   By: Richardean Sale M.D.   On: 07/25/2021 10:26   ECHOCARDIOGRAM COMPLETE  Result Date: 07/25/2021    ECHOCARDIOGRAM REPORT   Patient Name:   CLEO Casa Date of Exam: 07/25/2021 Medical Rec #:  [id number]          Height:       72.0 in Accession #:    [id number]         Weight:       330.0 lb Date of Birth:  [birth year]          BSA:          2.636 m Patient Age:    18 years           BP:           124/60 mmHg Patient Gender: M                  HR:           91 bpm. Exam Location:  Inpatient Procedure: 2D Echo, Cardiac Doppler and Color Doppler Indications:    Stroke I63.9  History:        Patient has prior history of Echocardiogram examinations, most                 recent 11/14/2020. Arrythmias:Atrial Fibrillation; Risk                 Factors:Hypertension, Diabetes and Dyslipidemia.  Sonographer:     Merrie Roof RDCS Referring Phys: [id number] [place]  1. Basal to mid inferior hypokinesis. Left ventricular ejection fraction, by estimation, is 55 to 60%. The left ventricle has normal function. The left ventricle demonstrates regional wall motion abnormalities (see scoring diagram/findings for description). There is moderate concentric left ventricular hypertrophy. Left ventricular diastolic parameters are consistent with Grade I diastolic dysfunction (impaired relaxation).  2. Right ventricular systolic function is normal. The right ventricular size is normal. There is normal pulmonary artery systolic pressure.  3. Left atrial size was mildly dilated.  4. The mitral valve is normal in structure. No evidence of mitral valve regurgitation. No evidence of mitral stenosis.  5. The aortic valve is tricuspid. Aortic valve regurgitation is not visualized. No aortic stenosis is present.  6. Aortic dilatation noted. There is mild dilatation of the ascending aorta, measuring 40 mm.  7. The inferior vena cava is normal in size with greater than 50% respiratory variability, suggesting right atrial pressure of 3 mmHg. FINDINGS  Left Ventricle: Basal to mid inferior hypokinesis. Left ventricular ejection fraction, by estimation, is 55 to 60%. The left ventricle has normal function. The left ventricle demonstrates  regional wall motion abnormalities. The left ventricular internal  cavity size was normal in size. There is moderate concentric left ventricular hypertrophy. Left ventricular diastolic parameters are consistent with Grade I diastolic dysfunction (impaired relaxation). Right Ventricle: The right ventricular size is normal. No increase in right ventricular wall thickness. Right ventricular systolic function is normal. There is normal pulmonary artery systolic pressure. The tricuspid regurgitant velocity is 1.95 m/s, and  with an assumed right atrial pressure of 3 mmHg, the estimated right ventricular  systolic pressure is 00.5 mmHg. Left Atrium: Left atrial size was mildly dilated. Right Atrium: Right atrial size was normal in size. Pericardium: There is no evidence of pericardial effusion. Mitral Valve: The mitral valve is normal in structure. No evidence of mitral valve regurgitation. No evidence of mitral valve stenosis. Tricuspid Valve: The tricuspid valve is normal in structure. Tricuspid valve regurgitation is trivial. No evidence of tricuspid stenosis. Aortic Valve: The aortic valve is tricuspid. Aortic valve regurgitation is not visualized. No aortic stenosis is present. Aortic valve mean gradient measures 6.0 mmHg. Aortic valve peak gradient measures 11.4 mmHg. Aortic valve area, by VTI measures 2.87  cm. Pulmonic Valve: The pulmonic valve was normal in structure. Pulmonic valve regurgitation is not visualized. No evidence of pulmonic stenosis. Aorta: Aortic dilatation noted. There is mild dilatation of the ascending aorta, measuring 40 mm. Venous: The inferior vena cava is normal in size with greater than 50% respiratory variability, suggesting right atrial pressure of 3 mmHg. IAS/Shunts: No atrial level shunt detected by color flow Doppler.  LEFT VENTRICLE PLAX 2D LVIDd:         5.13 cm LV PW:         1.53 cm LV IVS:        1.53 cm LVOT diam:     2.50 cm LV SV:         73 LV SV Index:   28 LVOT Area:     4.91 cm  RIGHT VENTRICLE RV Basal diam:  3.40 cm RV S prime:     12.10 cm/s TAPSE (M-mode): 1.6 cm LEFT ATRIUM           Index        RIGHT ATRIUM           Index LA diam:      3.60 cm 1.37 cm/m   RA Area:     18.00 cm LA Vol (A2C): 82.8 ml 31.41 ml/m  RA Volume:   46.30 ml  17.57 ml/m LA Vol (A4C): 90.0 ml 34.14 ml/m  AORTIC VALVE AV Area (Vmax):    2.79 cm AV Area (Vmean):   2.73 cm AV Area (VTI):     2.87 cm AV Vmax:           169.00 cm/s AV Vmean:          113.000 cm/s AV VTI:            0.255 m AV Peak Grad:      11.4 mmHg AV Mean Grad:      6.0 mmHg LVOT Vmax:         95.90 cm/s LVOT  Vmean:        62.900 cm/s LVOT VTI:          0.149 m LVOT/AV VTI ratio: 0.58  AORTA Ao Root diam: 3.00 cm Ao Asc diam:  4.00 cm TRICUSPID VALVE TR Peak grad:   15.2 mmHg TR Vmax:        195.00 cm/s  SHUNTS Systemic VTI:  0.15 m Systemic Diam: 2.50 cm [name] MD Electronically signed by [name] MD Signature Date/Time: 07/25/2021/11:05:51 PM    Final    CT HEAD CODE STROKE WO CONTRAST  Result Date: 07/25/2021 CLINICAL DATA:  Code stroke.  Right-sided weakness EXAM: CT HEAD WITHOUT CONTRAST TECHNIQUE: Contiguous axial images were obtained from the base of the skull through the vertex without intravenous contrast. COMPARISON:  None. FINDINGS: Brain: There is an acute intraparenchymal hematoma centered in the left internal capsule measuring 1.5 cm AP by 1.4 cm TV by 1.8 cm cc. There is mild surrounding edema without significant mass effect. A small satellite hemorrhage is also seen in the distal left caudate body. There is no evidence of acute infarct or extra-axial fluid collection. Background parenchymal volume is normal. The ventricles are normal in size. There is no solid mass lesion.  There is no midline shift. Vascular: No hyperdense vessel or unexpected calcification. Skull: Normal. Negative for fracture or focal lesion. Sinuses/Orbits: The paranasal sinuses are clear. The globes and orbits are unremarkable. Other: None. IMPRESSION: 1.8 cm acute intraparenchymal hematoma centered in the left internal capsule with mild surrounding edema but no significant mass effect, and smaller adjacent hematoma in the distal left caudate. These results were called by telephone at the time of interpretation on 07/25/2021 at 9:27 am to provider Dr. Theda Sers, who verbally acknowledged these results. Electronically Signed   By: Valetta Mole M.D.   On: 07/25/2021 09:27   VAS US CAROTID  Result Date: 07/26/2021 Carotid Arterial Duplex Study Patient Name:  DEMON Botsford  Date of Exam:   07/26/2021  Medical Rec #: [id number]           Accession #:    [id number] Date of Birth: [birth year]           Patient Gender: M Patient Age:   47 years Exam Location:  [facility] Procedure:      VAS US CAROTID Referring Phys: Rosalin Hawking --------------------------------------------------------------------------------  Indications:       CVA. Risk Factors:      Hypertension, hyperlipidemia, Diabetes. Comparison Study:  No prior study Performing Technologist: Maudry Mayhew MHA, RDMS, RVT, RDCS  Examination Guidelines: A complete evaluation includes B-mode imaging, spectral Doppler, color Doppler, and power Doppler as needed of all accessible portions of each vessel. Bilateral testing is considered an integral part of a complete examination. Limited examinations for reoccurring indications may be performed as noted.  Right Carotid Findings: +----------+--------+--------+--------+-----------------------+--------+             PSV cm/s EDV cm/s Stenosis Plaque Description      Comments  +----------+--------+--------+--------+-----------------------+--------+  CCA Prox   49       7                                                   +----------+--------+--------+--------+-----------------------+--------+  CCA Distal 49       9                 smooth and heterogenous           +----------+--------+--------+--------+-----------------------+--------+  ICA Prox   33       12                                                  +----------+--------+--------+--------+-----------------------+--------+  ICA Distal 48       19                                                  +----------+--------+--------+--------+-----------------------+--------+  ECA        81       6                                                   +----------+--------+--------+--------+-----------------------+--------+ +----------+--------+-------+----------------+-------------------+             PSV cm/s EDV cms Describe         Arm Pressure (mmHG)   +----------+--------+-------+----------------+-------------------+  Subclavian 167              Multiphasic, WNL                      +----------+--------+-------+----------------+-------------------+ +---------+--------+--+--------+-+---------+  Vertebral PSV cm/s 33 EDV cm/s 9 Antegrade  +---------+--------+--+--------+-+---------+  Left Carotid Findings: +----------+--------+--------+--------+--------------------------+--------+             PSV cm/s EDV cm/s Stenosis Plaque Description         Comments  +----------+--------+--------+--------+--------------------------+--------+  CCA Prox   81       13                                                     +----------+--------+--------+--------+--------------------------+--------+  CCA Distal 44       15                heterogenous and irregular           +----------+--------+--------+--------+--------------------------+--------+  ICA Prox   37       9                 smooth and heterogenous              +----------+--------+--------+--------+--------------------------+--------+  ICA Distal 76       28                                                     +----------+--------+--------+--------+--------------------------+--------+  ECA        89       7                                                      +----------+--------+--------+--------+--------------------------+--------+ +----------+--------+--------+----------------+-------------------+             PSV cm/s EDV cm/s Describe         Arm Pressure (mmHG)  +----------+--------+--------+----------------+-------------------+  Subclavian 106               Multiphasic, WNL                      +----------+--------+--------+----------------+-------------------+ +---------+--------+--+--------+--+---------+  Vertebral PSV cm/s 49 EDV cm/s 15 Antegrade  +---------+--------+--+--------+--+---------+   Summary: Right Carotid: Velocities in the right ICA are consistent with a 1-39% stenosis. Left Carotid: Velocities in the  left ICA are consistent with a 1-39% stenosis. Vertebrals:  Bilateral vertebral arteries demonstrate antegrade flow. Subclavians: Normal flow hemodynamics were seen in bilateral subclavian              arteries. *See table(s) above for measurements and observations.  Electronically signed by Rosalin Hawking MD on 07/26/2021 at 6:25:31 PM.    Final    CT ANGIO HEAD CODE STROKE  Result Date: 07/25/2021 CLINICAL DATA:  Intracranial hemorrhage EXAM: CT ANGIOGRAPHY HEAD TECHNIQUE: Multidetector CT imaging of the head was performed using the standard protocol during bolus administration of intravenous contrast. Multiplanar CT image reconstructions and MIPs were obtained to evaluate the vascular anatomy. CONTRAST:  55mL OMNIPAQUE IOHEXOL 350 MG/ML SOLN COMPARISON:  Same-day noncontrast CT head FINDINGS: CTA HEAD Anterior circulation: There is minimal atherosclerotic plaque in the intracranial ICAs without hemodynamically significant stenosis or occlusion. The bilateral MCAs are patent with mild atherosclerotic irregularity of the distal branches. There is severe stenosis at the origin of the right A1 segment (8-93). The anterior cerebral arteries are otherwise patent. The anterior communicating artery is patent. The distal ACAs are primarily fed by the prominent left A2 segment. There is a tiny posteriorly directed outpouching arising from the anterior communicating artery which could reflect a small (1 mm) aneurysm (7-96). There is no arteriovenous malformation. Posterior circulation: The bilateral V4 segments are patent. The basilar artery is patent. There is a fetal origin of the right PCA. The bilateral PCAs are patent with mild atherosclerotic irregularity of the distal branches There is no aneurysm or AVM. Venous sinuses: Not well evaluated due to contrast timing. Anatomic variants: As above. Other: There is no evidence of active contrast extravasation into the intraparenchymal hematoma. Review of the MIP images  confirms the above findings. IMPRESSION: 1. No evidence of active extravasation into the intraparenchymal hematoma. 2. No evidence of AVM. 3. 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 4. 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 Otherwise, patent intracranial vasculature. Electronically Signed   By: Valetta Mole M.D.   On: 07/25/2021 09:45     Labs:   Basic Metabolic Panel: Recent Labs  Lab 07/25/21 1030 07/26/21 0453 07/27/21 0333 07/28/21 0135 07/29/21 0233 07/30/21 0242  NA  --  139 140 140 141 138  K  --  3.2* 3.5 3.4* 3.5 3.6  CL  --  106 106 106 105 105  CO2  --  24 24 25 26 25   GLUCOSE  --  135* 132* 140* 145* 148*  BUN  --  22 24* 23 17 22   CREATININE  --  1.13 1.17 1.15 0.93 0.96  CALCIUM  --  8.5* 8.8* 8.5* 8.4* 8.3*  MG 1.3* 2.3 1.8  --   --   --   PHOS 2.8  --   --   --   --   --    GFR Estimated Creatinine Clearance: 113.9 mL/min (by C-G formula based on SCr of 0.96 mg/dL). Liver Function Tests: Recent Labs  Lab 07/25/21 0909  AST 24  ALT 23  ALKPHOS 76  BILITOT 0.7  PROT 6.8  ALBUMIN 3.7   No results for input(s): LIPASE, AMYLASE in the last 168 hours. No  results for input(s): AMMONIA in the last 168 hours. Coagulation profile Recent Labs  Lab 07/25/21 0909  INR 1.1    CBC: Recent Labs  Lab 07/25/21 0909 07/25/21 0911 07/26/21 0453 07/27/21 0333 07/28/21 0135 07/29/21 0233 07/30/21 0242  WBC 12.2*  --  12.2* 12.0* 10.5 14.5* 13.8*  NEUTROABS 8.6*  --   --   --   --   --   --   HGB 15.0   < > 14.0 13.9 13.4 13.9 13.9  HCT 43.4   < > 41.1 40.5 40.9 41.3 41.9  MCV 87.7  --  86.5 87.9 88.9 87.9 89.1  PLT 235  --  262 236 229 234 230   < > = values in this interval not displayed.   Cardiac Enzymes: No results for input(s): CKTOTAL, CKMB, CKMBINDEX, TROPONINI in the last 168 hours. BNP: Invalid input(s):  POCBNP CBG: Recent Labs  Lab 07/30/21 0626 07/30/21 1211 07/30/21 1616 07/30/21 2118 07/31/21 0604  GLUCAP 136* 151* 161* 137* 133*   D-Dimer No results for input(s): DDIMER in the last 72 hours. Hgb A1c No results for input(s): HGBA1C in the last 72 hours. Lipid Profile No results for input(s): CHOL, HDL, LDLCALC, TRIG, CHOLHDL, LDLDIRECT in the last 72 hours. Thyroid function studies No results for input(s): TSH, T4TOTAL, T3FREE, THYROIDAB in the last 72 hours.  Invalid input(s): FREET3 Anemia work up No results for input(s): VITAMINB12, FOLATE, FERRITIN, TIBC, IRON, RETICCTPCT in the last 72 hours. Microbiology Recent Results (from the past 240 hour(s))  Resp Panel by RT-PCR (Flu A&B, Covid) Nasopharyngeal Swab     Status: None   Collection Time: 07/25/21  9:45 AM   Specimen: Nasopharyngeal Swab; Nasopharyngeal(NP) swabs in vial transport medium  Result Value Ref Range Status   SARS Coronavirus 2 by RT PCR NEGATIVE NEGATIVE Final    Comment: (NOTE) SARS-CoV-2 target nucleic acids are NOT DETECTED.  The SARS-CoV-2 RNA is generally detectable in upper respiratory specimens during the acute phase of infection. The lowest concentration of SARS-CoV-2 viral copies this assay can detect is 138 copies/mL. A negative result does not preclude SARS-Cov-2 infection and should not be used as the sole basis for treatment or other patient management decisions. A negative result may occur with  improper specimen collection/handling, submission of specimen other than nasopharyngeal swab, presence of viral mutation(s) within the areas targeted by this assay, and inadequate number of viral copies(<138 copies/mL). A negative result must be combined with clinical observations, patient history, and epidemiological information. The expected result is Negative.  Fact Sheet for Patients:  EntrepreneurPulse.com.au  Fact Sheet for Healthcare Providers:   IncredibleEmployment.be  This test is no t yet approved or cleared by the [place] FDA and  has been authorized for detection and/or diagnosis of SARS-CoV-2 by FDA under an Emergency Use Authorization (EUA). This EUA will remain  in effect (meaning this test can be used) for the duration of the COVID-19 declaration under Section 564(b)(1) of the Act, 21 U.S.C.section 360bbb-3(b)(1), unless the authorization is terminated  or revoked sooner.       Influenza A by PCR NEGATIVE NEGATIVE Final   Influenza B by PCR NEGATIVE NEGATIVE Final    Comment: (NOTE) The Xpert Xpress SARS-CoV-2/FLU/RSV plus assay is intended as an aid in the diagnosis of influenza from Nasopharyngeal swab specimens and should not be used as a sole basis for treatment. Nasal washings and aspirates are unacceptable for Xpert Xpress SARS-CoV-2/FLU/RSV testing.  Fact Sheet for Patients: EntrepreneurPulse.com.au  Fact Sheet for Healthcare Providers: IncredibleEmployment.be  This test is not yet approved or cleared by the [place] FDA and has been authorized for detection and/or diagnosis of SARS-CoV-2 by FDA under an Emergency Use Authorization (EUA). This EUA will remain in effect (meaning this test can be used) for the duration of the COVID-19 declaration under Section 564(b)(1) of the Act, 21 U.S.C. section 360bbb-3(b)(1), unless the authorization is terminated or revoked.  Performed at [facility], [place] [address], [place], [place] [address]   Resp Panel by RT-PCR (Flu A&B, Covid) Nasopharyngeal Swab     Status: None   Collection Time: 07/30/21 10:50 AM   Specimen: Nasopharyngeal Swab; Nasopharyngeal(NP) swabs in vial transport medium  Result Value Ref Range Status   SARS Coronavirus 2 by RT PCR NEGATIVE NEGATIVE Final    Comment: (NOTE) SARS-CoV-2 target nucleic acids are NOT DETECTED.  The SARS-CoV-2 RNA is generally detectable in  upper respiratory specimens during the acute phase of infection. The lowest concentration of SARS-CoV-2 viral copies this assay can detect is 138 copies/mL. A negative result does not preclude SARS-Cov-2 infection and should not be used as the sole basis for treatment or other patient management decisions. A negative result may occur with  improper specimen collection/handling, submission of specimen other than nasopharyngeal swab, presence of viral mutation(s) within the areas targeted by this assay, and inadequate number of viral copies(<138 copies/mL). A negative result must be combined with clinical observations, patient history, and epidemiological information. The expected result is Negative.  Fact Sheet for Patients:  EntrepreneurPulse.com.au  Fact Sheet for Healthcare Providers:  IncredibleEmployment.be  This test is no t yet approved or cleared by the [place] FDA and  has been authorized for detection and/or diagnosis of SARS-CoV-2 by FDA under an Emergency Use Authorization (EUA). This EUA will remain  in effect (meaning this test can be used) for the duration of the COVID-19 declaration under Section 564(b)(1) of the Act, 21 U.S.C.section 360bbb-3(b)(1), unless the authorization is terminated  or revoked sooner.       Influenza A by PCR NEGATIVE NEGATIVE Final   Influenza B by PCR NEGATIVE NEGATIVE Final    Comment: (NOTE) The Xpert Xpress SARS-CoV-2/FLU/RSV plus assay is intended as an aid in the diagnosis of influenza from Nasopharyngeal swab specimens and should not be used as a sole basis for treatment. Nasal washings and aspirates are unacceptable for Xpert Xpress SARS-CoV-2/FLU/RSV testing.  Fact Sheet for Patients: EntrepreneurPulse.com.au  Fact Sheet for Healthcare Providers: IncredibleEmployment.be  This test is not yet approved or cleared by the [place] FDA and has been  authorized for detection and/or diagnosis of SARS-CoV-2 by FDA under an Emergency Use Authorization (EUA). This EUA will remain in effect (meaning this test can be used) for the duration of the COVID-19 declaration under Section 564(b)(1) of the Act, 21 U.S.C. section 360bbb-3(b)(1), unless the authorization is terminated or revoked.  Performed at [facility], [place] [address] [place], [place] [address]      Signed: Terrilee Croak  [facility] 07/31/2021, 10:17 AM

## 2021-07-31 NOTE — Progress Notes (Signed)
PT Cancellation Note  Patient Details Name: Jaceon Heiberger MRN: [id number] DOB: Mar 19, 1956   Cancelled Treatment:    Reason Eval/Treat Not Completed: Other (comment).  Declining therapy with no specific reason.  Retry as time and pt allow.   Ramond Dial 07/31/2021, 1:13 PM  Mee Hives, PT PhD Acute Rehab Dept. Number: [place] [facility] [place]

## 2021-07-31 NOTE — Progress Notes (Signed)
Report called to Patterson Hammersmith, LPN of [facility] for patient going to room 3205.  All questions answered.  Awaiting transport at 2pm.

## 2021-07-31 NOTE — Progress Notes (Signed)
Pt had oxygen on sating 97 % on 2liters [place]. Pt had cpap at bedside. Orders in chart. Cpap applied and oxygen connected to cpap.

## 2021-08-01 DIAGNOSIS — J309 Allergic rhinitis, unspecified: Secondary | ICD-10-CM | POA: Diagnosis not present

## 2021-08-01 DIAGNOSIS — K5900 Constipation, unspecified: Secondary | ICD-10-CM | POA: Diagnosis not present

## 2021-08-01 DIAGNOSIS — E785 Hyperlipidemia, unspecified: Secondary | ICD-10-CM | POA: Diagnosis not present

## 2021-08-01 DIAGNOSIS — E039 Hypothyroidism, unspecified: Secondary | ICD-10-CM | POA: Diagnosis not present

## 2021-08-01 DIAGNOSIS — G8191 Hemiplegia, unspecified affecting right dominant side: Secondary | ICD-10-CM | POA: Diagnosis not present

## 2021-08-01 DIAGNOSIS — I4891 Unspecified atrial fibrillation: Secondary | ICD-10-CM | POA: Diagnosis not present

## 2021-08-01 DIAGNOSIS — M2550 Pain in unspecified joint: Secondary | ICD-10-CM | POA: Diagnosis not present

## 2021-08-01 DIAGNOSIS — I639 Cerebral infarction, unspecified: Secondary | ICD-10-CM | POA: Diagnosis not present

## 2021-08-01 DIAGNOSIS — I10 Essential (primary) hypertension: Secondary | ICD-10-CM | POA: Diagnosis not present

## 2021-08-01 DIAGNOSIS — K219 Gastro-esophageal reflux disease without esophagitis: Secondary | ICD-10-CM | POA: Diagnosis not present

## 2021-08-01 DIAGNOSIS — E119 Type 2 diabetes mellitus without complications: Secondary | ICD-10-CM | POA: Diagnosis not present

## 2021-08-02 DIAGNOSIS — I10 Essential (primary) hypertension: Secondary | ICD-10-CM | POA: Diagnosis not present

## 2021-08-02 DIAGNOSIS — E039 Hypothyroidism, unspecified: Secondary | ICD-10-CM | POA: Diagnosis not present

## 2021-08-02 DIAGNOSIS — I69359 Hemiplegia and hemiparesis following cerebral infarction affecting unspecified side: Secondary | ICD-10-CM | POA: Diagnosis not present

## 2021-08-02 DIAGNOSIS — E119 Type 2 diabetes mellitus without complications: Secondary | ICD-10-CM | POA: Diagnosis not present

## 2021-08-02 DIAGNOSIS — E785 Hyperlipidemia, unspecified: Secondary | ICD-10-CM | POA: Diagnosis not present

## 2021-08-02 DIAGNOSIS — I4891 Unspecified atrial fibrillation: Secondary | ICD-10-CM | POA: Diagnosis not present

## 2021-08-02 DIAGNOSIS — I619 Nontraumatic intracerebral hemorrhage, unspecified: Secondary | ICD-10-CM | POA: Diagnosis not present

## 2021-08-07 DIAGNOSIS — I4891 Unspecified atrial fibrillation: Secondary | ICD-10-CM | POA: Diagnosis not present

## 2021-08-07 DIAGNOSIS — K219 Gastro-esophageal reflux disease without esophagitis: Secondary | ICD-10-CM | POA: Diagnosis not present

## 2021-08-07 DIAGNOSIS — G8191 Hemiplegia, unspecified affecting right dominant side: Secondary | ICD-10-CM | POA: Diagnosis not present

## 2021-08-07 DIAGNOSIS — I10 Essential (primary) hypertension: Secondary | ICD-10-CM | POA: Diagnosis not present

## 2021-08-17 DIAGNOSIS — K219 Gastro-esophageal reflux disease without esophagitis: Secondary | ICD-10-CM | POA: Diagnosis not present

## 2021-08-17 DIAGNOSIS — G8191 Hemiplegia, unspecified affecting right dominant side: Secondary | ICD-10-CM | POA: Diagnosis not present

## 2021-08-17 DIAGNOSIS — I4891 Unspecified atrial fibrillation: Secondary | ICD-10-CM | POA: Diagnosis not present

## 2021-08-17 DIAGNOSIS — I10 Essential (primary) hypertension: Secondary | ICD-10-CM | POA: Diagnosis not present

## 2021-08-20 DIAGNOSIS — E119 Type 2 diabetes mellitus without complications: Secondary | ICD-10-CM | POA: Diagnosis not present

## 2021-08-20 DIAGNOSIS — M2550 Pain in unspecified joint: Secondary | ICD-10-CM | POA: Diagnosis not present

## 2021-08-20 DIAGNOSIS — K219 Gastro-esophageal reflux disease without esophagitis: Secondary | ICD-10-CM | POA: Diagnosis not present

## 2021-08-20 DIAGNOSIS — G8191 Hemiplegia, unspecified affecting right dominant side: Secondary | ICD-10-CM | POA: Diagnosis not present

## 2021-08-20 DIAGNOSIS — I4891 Unspecified atrial fibrillation: Secondary | ICD-10-CM | POA: Diagnosis not present

## 2021-08-23 ENCOUNTER — Ambulatory Visit (HOSPITAL_BASED_OUTPATIENT_CLINIC_OR_DEPARTMENT_OTHER): Payer: Medicare Other | Admitting: Cardiovascular Disease

## 2021-08-23 ENCOUNTER — Telehealth: Payer: Self-pay | Admitting: Emergency Medicine

## 2021-08-23 DIAGNOSIS — I10 Essential (primary) hypertension: Secondary | ICD-10-CM | POA: Diagnosis not present

## 2021-08-23 DIAGNOSIS — I69359 Hemiplegia and hemiparesis following cerebral infarction affecting unspecified side: Secondary | ICD-10-CM | POA: Diagnosis not present

## 2021-08-23 DIAGNOSIS — I619 Nontraumatic intracerebral hemorrhage, unspecified: Secondary | ICD-10-CM | POA: Diagnosis not present

## 2021-08-23 NOTE — Telephone Encounter (Signed)
Pt's sister making provider aware pt is currently at [facility] due to having a stroke

## 2021-08-25 NOTE — Telephone Encounter (Signed)
Thanks

## 2021-09-25 DIAGNOSIS — I619 Nontraumatic intracerebral hemorrhage, unspecified: Secondary | ICD-10-CM | POA: Diagnosis not present

## 2021-09-25 DIAGNOSIS — I4811 Longstanding persistent atrial fibrillation: Secondary | ICD-10-CM | POA: Diagnosis not present

## 2021-09-25 DIAGNOSIS — E1165 Type 2 diabetes mellitus with hyperglycemia: Secondary | ICD-10-CM | POA: Diagnosis not present

## 2021-09-25 DIAGNOSIS — M6281 Muscle weakness (generalized): Secondary | ICD-10-CM | POA: Diagnosis not present

## 2021-09-25 DIAGNOSIS — E1169 Type 2 diabetes mellitus with other specified complication: Secondary | ICD-10-CM | POA: Diagnosis not present

## 2021-09-25 DIAGNOSIS — R2681 Unsteadiness on feet: Secondary | ICD-10-CM | POA: Diagnosis not present

## 2021-09-25 DIAGNOSIS — R262 Difficulty in walking, not elsewhere classified: Secondary | ICD-10-CM | POA: Diagnosis not present

## 2021-09-25 DIAGNOSIS — R278 Other lack of coordination: Secondary | ICD-10-CM | POA: Diagnosis not present

## 2021-09-25 DIAGNOSIS — E785 Hyperlipidemia, unspecified: Secondary | ICD-10-CM | POA: Diagnosis not present

## 2021-09-26 DIAGNOSIS — R2681 Unsteadiness on feet: Secondary | ICD-10-CM | POA: Diagnosis not present

## 2021-09-26 DIAGNOSIS — I10 Essential (primary) hypertension: Secondary | ICD-10-CM | POA: Diagnosis not present

## 2021-09-26 DIAGNOSIS — E1165 Type 2 diabetes mellitus with hyperglycemia: Secondary | ICD-10-CM | POA: Diagnosis not present

## 2021-09-26 DIAGNOSIS — R262 Difficulty in walking, not elsewhere classified: Secondary | ICD-10-CM | POA: Diagnosis not present

## 2021-09-26 DIAGNOSIS — I619 Nontraumatic intracerebral hemorrhage, unspecified: Secondary | ICD-10-CM | POA: Diagnosis not present

## 2021-09-26 DIAGNOSIS — M6281 Muscle weakness (generalized): Secondary | ICD-10-CM | POA: Diagnosis not present

## 2021-09-26 DIAGNOSIS — E1169 Type 2 diabetes mellitus with other specified complication: Secondary | ICD-10-CM | POA: Diagnosis not present

## 2021-09-26 DIAGNOSIS — G8191 Hemiplegia, unspecified affecting right dominant side: Secondary | ICD-10-CM | POA: Diagnosis not present

## 2021-09-26 DIAGNOSIS — I4891 Unspecified atrial fibrillation: Secondary | ICD-10-CM | POA: Diagnosis not present

## 2021-09-26 DIAGNOSIS — E785 Hyperlipidemia, unspecified: Secondary | ICD-10-CM | POA: Diagnosis not present

## 2021-09-26 DIAGNOSIS — I4811 Longstanding persistent atrial fibrillation: Secondary | ICD-10-CM | POA: Diagnosis not present

## 2021-09-26 DIAGNOSIS — R278 Other lack of coordination: Secondary | ICD-10-CM | POA: Diagnosis not present

## 2021-09-26 DIAGNOSIS — E119 Type 2 diabetes mellitus without complications: Secondary | ICD-10-CM | POA: Diagnosis not present

## 2021-09-27 DIAGNOSIS — I619 Nontraumatic intracerebral hemorrhage, unspecified: Secondary | ICD-10-CM | POA: Diagnosis not present

## 2021-09-27 DIAGNOSIS — I4811 Longstanding persistent atrial fibrillation: Secondary | ICD-10-CM | POA: Diagnosis not present

## 2021-09-27 DIAGNOSIS — R262 Difficulty in walking, not elsewhere classified: Secondary | ICD-10-CM | POA: Diagnosis not present

## 2021-09-27 DIAGNOSIS — E785 Hyperlipidemia, unspecified: Secondary | ICD-10-CM | POA: Diagnosis not present

## 2021-09-27 DIAGNOSIS — R278 Other lack of coordination: Secondary | ICD-10-CM | POA: Diagnosis not present

## 2021-09-27 DIAGNOSIS — E1165 Type 2 diabetes mellitus with hyperglycemia: Secondary | ICD-10-CM | POA: Diagnosis not present

## 2021-09-27 DIAGNOSIS — E1169 Type 2 diabetes mellitus with other specified complication: Secondary | ICD-10-CM | POA: Diagnosis not present

## 2021-09-27 DIAGNOSIS — R2681 Unsteadiness on feet: Secondary | ICD-10-CM | POA: Diagnosis not present

## 2021-09-27 DIAGNOSIS — M6281 Muscle weakness (generalized): Secondary | ICD-10-CM | POA: Diagnosis not present

## 2021-09-30 DIAGNOSIS — R278 Other lack of coordination: Secondary | ICD-10-CM | POA: Diagnosis not present

## 2021-09-30 DIAGNOSIS — E785 Hyperlipidemia, unspecified: Secondary | ICD-10-CM | POA: Diagnosis not present

## 2021-09-30 DIAGNOSIS — E1165 Type 2 diabetes mellitus with hyperglycemia: Secondary | ICD-10-CM | POA: Diagnosis not present

## 2021-09-30 DIAGNOSIS — I4811 Longstanding persistent atrial fibrillation: Secondary | ICD-10-CM | POA: Diagnosis not present

## 2021-09-30 DIAGNOSIS — E1169 Type 2 diabetes mellitus with other specified complication: Secondary | ICD-10-CM | POA: Diagnosis not present

## 2021-09-30 DIAGNOSIS — R262 Difficulty in walking, not elsewhere classified: Secondary | ICD-10-CM | POA: Diagnosis not present

## 2021-09-30 DIAGNOSIS — M6281 Muscle weakness (generalized): Secondary | ICD-10-CM | POA: Diagnosis not present

## 2021-09-30 DIAGNOSIS — I619 Nontraumatic intracerebral hemorrhage, unspecified: Secondary | ICD-10-CM | POA: Diagnosis not present

## 2021-09-30 DIAGNOSIS — R2681 Unsteadiness on feet: Secondary | ICD-10-CM | POA: Diagnosis not present

## 2021-10-01 DIAGNOSIS — I619 Nontraumatic intracerebral hemorrhage, unspecified: Secondary | ICD-10-CM | POA: Diagnosis not present

## 2021-10-01 DIAGNOSIS — I4811 Longstanding persistent atrial fibrillation: Secondary | ICD-10-CM | POA: Diagnosis not present

## 2021-10-01 DIAGNOSIS — E1165 Type 2 diabetes mellitus with hyperglycemia: Secondary | ICD-10-CM | POA: Diagnosis not present

## 2021-10-01 DIAGNOSIS — R262 Difficulty in walking, not elsewhere classified: Secondary | ICD-10-CM | POA: Diagnosis not present

## 2021-10-01 DIAGNOSIS — R2681 Unsteadiness on feet: Secondary | ICD-10-CM | POA: Diagnosis not present

## 2021-10-01 DIAGNOSIS — R278 Other lack of coordination: Secondary | ICD-10-CM | POA: Diagnosis not present

## 2021-10-01 DIAGNOSIS — E1169 Type 2 diabetes mellitus with other specified complication: Secondary | ICD-10-CM | POA: Diagnosis not present

## 2021-10-01 DIAGNOSIS — E785 Hyperlipidemia, unspecified: Secondary | ICD-10-CM | POA: Diagnosis not present

## 2021-10-01 DIAGNOSIS — M6281 Muscle weakness (generalized): Secondary | ICD-10-CM | POA: Diagnosis not present

## 2021-10-02 DIAGNOSIS — R2681 Unsteadiness on feet: Secondary | ICD-10-CM | POA: Diagnosis not present

## 2021-10-02 DIAGNOSIS — R262 Difficulty in walking, not elsewhere classified: Secondary | ICD-10-CM | POA: Diagnosis not present

## 2021-10-02 DIAGNOSIS — E785 Hyperlipidemia, unspecified: Secondary | ICD-10-CM | POA: Diagnosis not present

## 2021-10-02 DIAGNOSIS — M6281 Muscle weakness (generalized): Secondary | ICD-10-CM | POA: Diagnosis not present

## 2021-10-02 DIAGNOSIS — E1165 Type 2 diabetes mellitus with hyperglycemia: Secondary | ICD-10-CM | POA: Diagnosis not present

## 2021-10-02 DIAGNOSIS — I619 Nontraumatic intracerebral hemorrhage, unspecified: Secondary | ICD-10-CM | POA: Diagnosis not present

## 2021-10-02 DIAGNOSIS — I4811 Longstanding persistent atrial fibrillation: Secondary | ICD-10-CM | POA: Diagnosis not present

## 2021-10-02 DIAGNOSIS — E1169 Type 2 diabetes mellitus with other specified complication: Secondary | ICD-10-CM | POA: Diagnosis not present

## 2021-10-02 DIAGNOSIS — R278 Other lack of coordination: Secondary | ICD-10-CM | POA: Diagnosis not present

## 2021-10-03 DIAGNOSIS — R262 Difficulty in walking, not elsewhere classified: Secondary | ICD-10-CM | POA: Diagnosis not present

## 2021-10-03 DIAGNOSIS — R278 Other lack of coordination: Secondary | ICD-10-CM | POA: Diagnosis not present

## 2021-10-03 DIAGNOSIS — R2681 Unsteadiness on feet: Secondary | ICD-10-CM | POA: Diagnosis not present

## 2021-10-03 DIAGNOSIS — E1169 Type 2 diabetes mellitus with other specified complication: Secondary | ICD-10-CM | POA: Diagnosis not present

## 2021-10-03 DIAGNOSIS — M6281 Muscle weakness (generalized): Secondary | ICD-10-CM | POA: Diagnosis not present

## 2021-10-03 DIAGNOSIS — E785 Hyperlipidemia, unspecified: Secondary | ICD-10-CM | POA: Diagnosis not present

## 2021-10-03 DIAGNOSIS — I619 Nontraumatic intracerebral hemorrhage, unspecified: Secondary | ICD-10-CM | POA: Diagnosis not present

## 2021-10-03 DIAGNOSIS — I4811 Longstanding persistent atrial fibrillation: Secondary | ICD-10-CM | POA: Diagnosis not present

## 2021-10-03 DIAGNOSIS — E1165 Type 2 diabetes mellitus with hyperglycemia: Secondary | ICD-10-CM | POA: Diagnosis not present

## 2021-10-04 DIAGNOSIS — E785 Hyperlipidemia, unspecified: Secondary | ICD-10-CM | POA: Diagnosis not present

## 2021-10-04 DIAGNOSIS — E1169 Type 2 diabetes mellitus with other specified complication: Secondary | ICD-10-CM | POA: Diagnosis not present

## 2021-10-04 DIAGNOSIS — I4811 Longstanding persistent atrial fibrillation: Secondary | ICD-10-CM | POA: Diagnosis not present

## 2021-10-04 DIAGNOSIS — M6281 Muscle weakness (generalized): Secondary | ICD-10-CM | POA: Diagnosis not present

## 2021-10-04 DIAGNOSIS — R278 Other lack of coordination: Secondary | ICD-10-CM | POA: Diagnosis not present

## 2021-10-04 DIAGNOSIS — E1165 Type 2 diabetes mellitus with hyperglycemia: Secondary | ICD-10-CM | POA: Diagnosis not present

## 2021-10-04 DIAGNOSIS — R262 Difficulty in walking, not elsewhere classified: Secondary | ICD-10-CM | POA: Diagnosis not present

## 2021-10-04 DIAGNOSIS — R2681 Unsteadiness on feet: Secondary | ICD-10-CM | POA: Diagnosis not present

## 2021-10-04 DIAGNOSIS — I619 Nontraumatic intracerebral hemorrhage, unspecified: Secondary | ICD-10-CM | POA: Diagnosis not present

## 2021-10-05 DIAGNOSIS — R278 Other lack of coordination: Secondary | ICD-10-CM | POA: Diagnosis not present

## 2021-10-05 DIAGNOSIS — I4811 Longstanding persistent atrial fibrillation: Secondary | ICD-10-CM | POA: Diagnosis not present

## 2021-10-05 DIAGNOSIS — E1169 Type 2 diabetes mellitus with other specified complication: Secondary | ICD-10-CM | POA: Diagnosis not present

## 2021-10-05 DIAGNOSIS — R262 Difficulty in walking, not elsewhere classified: Secondary | ICD-10-CM | POA: Diagnosis not present

## 2021-10-05 DIAGNOSIS — M6281 Muscle weakness (generalized): Secondary | ICD-10-CM | POA: Diagnosis not present

## 2021-10-05 DIAGNOSIS — E1165 Type 2 diabetes mellitus with hyperglycemia: Secondary | ICD-10-CM | POA: Diagnosis not present

## 2021-10-05 DIAGNOSIS — R2681 Unsteadiness on feet: Secondary | ICD-10-CM | POA: Diagnosis not present

## 2021-10-05 DIAGNOSIS — I619 Nontraumatic intracerebral hemorrhage, unspecified: Secondary | ICD-10-CM | POA: Diagnosis not present

## 2021-10-05 DIAGNOSIS — E785 Hyperlipidemia, unspecified: Secondary | ICD-10-CM | POA: Diagnosis not present

## 2021-10-06 DIAGNOSIS — E1165 Type 2 diabetes mellitus with hyperglycemia: Secondary | ICD-10-CM | POA: Diagnosis not present

## 2021-10-06 DIAGNOSIS — M6281 Muscle weakness (generalized): Secondary | ICD-10-CM | POA: Diagnosis not present

## 2021-10-06 DIAGNOSIS — E785 Hyperlipidemia, unspecified: Secondary | ICD-10-CM | POA: Diagnosis not present

## 2021-10-06 DIAGNOSIS — I619 Nontraumatic intracerebral hemorrhage, unspecified: Secondary | ICD-10-CM | POA: Diagnosis not present

## 2021-10-06 DIAGNOSIS — I4811 Longstanding persistent atrial fibrillation: Secondary | ICD-10-CM | POA: Diagnosis not present

## 2021-10-06 DIAGNOSIS — R262 Difficulty in walking, not elsewhere classified: Secondary | ICD-10-CM | POA: Diagnosis not present

## 2021-10-06 DIAGNOSIS — E1169 Type 2 diabetes mellitus with other specified complication: Secondary | ICD-10-CM | POA: Diagnosis not present

## 2021-10-06 DIAGNOSIS — R278 Other lack of coordination: Secondary | ICD-10-CM | POA: Diagnosis not present

## 2021-10-06 DIAGNOSIS — R2681 Unsteadiness on feet: Secondary | ICD-10-CM | POA: Diagnosis not present

## 2021-10-07 DIAGNOSIS — E1165 Type 2 diabetes mellitus with hyperglycemia: Secondary | ICD-10-CM | POA: Diagnosis not present

## 2021-10-07 DIAGNOSIS — M6281 Muscle weakness (generalized): Secondary | ICD-10-CM | POA: Diagnosis not present

## 2021-10-07 DIAGNOSIS — I619 Nontraumatic intracerebral hemorrhage, unspecified: Secondary | ICD-10-CM | POA: Diagnosis not present

## 2021-10-07 DIAGNOSIS — R262 Difficulty in walking, not elsewhere classified: Secondary | ICD-10-CM | POA: Diagnosis not present

## 2021-10-07 DIAGNOSIS — R2681 Unsteadiness on feet: Secondary | ICD-10-CM | POA: Diagnosis not present

## 2021-10-07 DIAGNOSIS — E1169 Type 2 diabetes mellitus with other specified complication: Secondary | ICD-10-CM | POA: Diagnosis not present

## 2021-10-07 DIAGNOSIS — I4811 Longstanding persistent atrial fibrillation: Secondary | ICD-10-CM | POA: Diagnosis not present

## 2021-10-07 DIAGNOSIS — R278 Other lack of coordination: Secondary | ICD-10-CM | POA: Diagnosis not present

## 2021-10-07 DIAGNOSIS — E785 Hyperlipidemia, unspecified: Secondary | ICD-10-CM | POA: Diagnosis not present

## 2021-10-08 DIAGNOSIS — R262 Difficulty in walking, not elsewhere classified: Secondary | ICD-10-CM | POA: Diagnosis not present

## 2021-10-08 DIAGNOSIS — I619 Nontraumatic intracerebral hemorrhage, unspecified: Secondary | ICD-10-CM | POA: Diagnosis not present

## 2021-10-08 DIAGNOSIS — R278 Other lack of coordination: Secondary | ICD-10-CM | POA: Diagnosis not present

## 2021-10-08 DIAGNOSIS — M6281 Muscle weakness (generalized): Secondary | ICD-10-CM | POA: Diagnosis not present

## 2021-10-08 DIAGNOSIS — E1169 Type 2 diabetes mellitus with other specified complication: Secondary | ICD-10-CM | POA: Diagnosis not present

## 2021-10-08 DIAGNOSIS — E1165 Type 2 diabetes mellitus with hyperglycemia: Secondary | ICD-10-CM | POA: Diagnosis not present

## 2021-10-08 DIAGNOSIS — E785 Hyperlipidemia, unspecified: Secondary | ICD-10-CM | POA: Diagnosis not present

## 2021-10-08 DIAGNOSIS — R2681 Unsteadiness on feet: Secondary | ICD-10-CM | POA: Diagnosis not present

## 2021-10-08 DIAGNOSIS — I4811 Longstanding persistent atrial fibrillation: Secondary | ICD-10-CM | POA: Diagnosis not present

## 2021-10-09 DIAGNOSIS — E1165 Type 2 diabetes mellitus with hyperglycemia: Secondary | ICD-10-CM | POA: Diagnosis not present

## 2021-10-09 DIAGNOSIS — E1169 Type 2 diabetes mellitus with other specified complication: Secondary | ICD-10-CM | POA: Diagnosis not present

## 2021-10-09 DIAGNOSIS — I619 Nontraumatic intracerebral hemorrhage, unspecified: Secondary | ICD-10-CM | POA: Diagnosis not present

## 2021-10-09 DIAGNOSIS — M6281 Muscle weakness (generalized): Secondary | ICD-10-CM | POA: Diagnosis not present

## 2021-10-09 DIAGNOSIS — R2681 Unsteadiness on feet: Secondary | ICD-10-CM | POA: Diagnosis not present

## 2021-10-09 DIAGNOSIS — I4811 Longstanding persistent atrial fibrillation: Secondary | ICD-10-CM | POA: Diagnosis not present

## 2021-10-09 DIAGNOSIS — R262 Difficulty in walking, not elsewhere classified: Secondary | ICD-10-CM | POA: Diagnosis not present

## 2021-10-09 DIAGNOSIS — E785 Hyperlipidemia, unspecified: Secondary | ICD-10-CM | POA: Diagnosis not present

## 2021-10-09 DIAGNOSIS — R278 Other lack of coordination: Secondary | ICD-10-CM | POA: Diagnosis not present

## 2021-10-10 DIAGNOSIS — R2681 Unsteadiness on feet: Secondary | ICD-10-CM | POA: Diagnosis not present

## 2021-10-10 DIAGNOSIS — R278 Other lack of coordination: Secondary | ICD-10-CM | POA: Diagnosis not present

## 2021-10-10 DIAGNOSIS — M6281 Muscle weakness (generalized): Secondary | ICD-10-CM | POA: Diagnosis not present

## 2021-10-10 DIAGNOSIS — E1169 Type 2 diabetes mellitus with other specified complication: Secondary | ICD-10-CM | POA: Diagnosis not present

## 2021-10-10 DIAGNOSIS — E785 Hyperlipidemia, unspecified: Secondary | ICD-10-CM | POA: Diagnosis not present

## 2021-10-10 DIAGNOSIS — I619 Nontraumatic intracerebral hemorrhage, unspecified: Secondary | ICD-10-CM | POA: Diagnosis not present

## 2021-10-10 DIAGNOSIS — I4811 Longstanding persistent atrial fibrillation: Secondary | ICD-10-CM | POA: Diagnosis not present

## 2021-10-10 DIAGNOSIS — R262 Difficulty in walking, not elsewhere classified: Secondary | ICD-10-CM | POA: Diagnosis not present

## 2021-10-10 DIAGNOSIS — E1165 Type 2 diabetes mellitus with hyperglycemia: Secondary | ICD-10-CM | POA: Diagnosis not present

## 2021-10-11 DIAGNOSIS — K219 Gastro-esophageal reflux disease without esophagitis: Secondary | ICD-10-CM | POA: Diagnosis not present

## 2021-10-11 DIAGNOSIS — R278 Other lack of coordination: Secondary | ICD-10-CM | POA: Diagnosis not present

## 2021-10-11 DIAGNOSIS — R262 Difficulty in walking, not elsewhere classified: Secondary | ICD-10-CM | POA: Diagnosis not present

## 2021-10-11 DIAGNOSIS — E1169 Type 2 diabetes mellitus with other specified complication: Secondary | ICD-10-CM | POA: Diagnosis not present

## 2021-10-11 DIAGNOSIS — G8191 Hemiplegia, unspecified affecting right dominant side: Secondary | ICD-10-CM | POA: Diagnosis not present

## 2021-10-11 DIAGNOSIS — E785 Hyperlipidemia, unspecified: Secondary | ICD-10-CM | POA: Diagnosis not present

## 2021-10-11 DIAGNOSIS — M6281 Muscle weakness (generalized): Secondary | ICD-10-CM | POA: Diagnosis not present

## 2021-10-11 DIAGNOSIS — I10 Essential (primary) hypertension: Secondary | ICD-10-CM | POA: Diagnosis not present

## 2021-10-11 DIAGNOSIS — E1165 Type 2 diabetes mellitus with hyperglycemia: Secondary | ICD-10-CM | POA: Diagnosis not present

## 2021-10-11 DIAGNOSIS — I4811 Longstanding persistent atrial fibrillation: Secondary | ICD-10-CM | POA: Diagnosis not present

## 2021-10-11 DIAGNOSIS — I4891 Unspecified atrial fibrillation: Secondary | ICD-10-CM | POA: Diagnosis not present

## 2021-10-11 DIAGNOSIS — I619 Nontraumatic intracerebral hemorrhage, unspecified: Secondary | ICD-10-CM | POA: Diagnosis not present

## 2021-10-11 DIAGNOSIS — R2681 Unsteadiness on feet: Secondary | ICD-10-CM | POA: Diagnosis not present

## 2021-10-12 DIAGNOSIS — E119 Type 2 diabetes mellitus without complications: Secondary | ICD-10-CM | POA: Diagnosis not present

## 2021-10-12 DIAGNOSIS — R7989 Other specified abnormal findings of blood chemistry: Secondary | ICD-10-CM | POA: Diagnosis not present

## 2021-10-12 DIAGNOSIS — N390 Urinary tract infection, site not specified: Secondary | ICD-10-CM | POA: Diagnosis not present

## 2021-10-13 DIAGNOSIS — E1165 Type 2 diabetes mellitus with hyperglycemia: Secondary | ICD-10-CM | POA: Diagnosis not present

## 2021-10-13 DIAGNOSIS — I4811 Longstanding persistent atrial fibrillation: Secondary | ICD-10-CM | POA: Diagnosis not present

## 2021-10-13 DIAGNOSIS — E1169 Type 2 diabetes mellitus with other specified complication: Secondary | ICD-10-CM | POA: Diagnosis not present

## 2021-10-13 DIAGNOSIS — M6281 Muscle weakness (generalized): Secondary | ICD-10-CM | POA: Diagnosis not present

## 2021-10-13 DIAGNOSIS — I619 Nontraumatic intracerebral hemorrhage, unspecified: Secondary | ICD-10-CM | POA: Diagnosis not present

## 2021-10-13 DIAGNOSIS — R2681 Unsteadiness on feet: Secondary | ICD-10-CM | POA: Diagnosis not present

## 2021-10-13 DIAGNOSIS — E785 Hyperlipidemia, unspecified: Secondary | ICD-10-CM | POA: Diagnosis not present

## 2021-10-13 DIAGNOSIS — R278 Other lack of coordination: Secondary | ICD-10-CM | POA: Diagnosis not present

## 2021-10-13 DIAGNOSIS — R262 Difficulty in walking, not elsewhere classified: Secondary | ICD-10-CM | POA: Diagnosis not present

## 2021-10-15 DIAGNOSIS — R278 Other lack of coordination: Secondary | ICD-10-CM | POA: Diagnosis not present

## 2021-10-15 DIAGNOSIS — R262 Difficulty in walking, not elsewhere classified: Secondary | ICD-10-CM | POA: Diagnosis not present

## 2021-10-15 DIAGNOSIS — R2681 Unsteadiness on feet: Secondary | ICD-10-CM | POA: Diagnosis not present

## 2021-10-15 DIAGNOSIS — E785 Hyperlipidemia, unspecified: Secondary | ICD-10-CM | POA: Diagnosis not present

## 2021-10-15 DIAGNOSIS — E1169 Type 2 diabetes mellitus with other specified complication: Secondary | ICD-10-CM | POA: Diagnosis not present

## 2021-10-15 DIAGNOSIS — M6281 Muscle weakness (generalized): Secondary | ICD-10-CM | POA: Diagnosis not present

## 2021-10-15 DIAGNOSIS — I4811 Longstanding persistent atrial fibrillation: Secondary | ICD-10-CM | POA: Diagnosis not present

## 2021-10-15 DIAGNOSIS — E1165 Type 2 diabetes mellitus with hyperglycemia: Secondary | ICD-10-CM | POA: Diagnosis not present

## 2021-10-15 DIAGNOSIS — I619 Nontraumatic intracerebral hemorrhage, unspecified: Secondary | ICD-10-CM | POA: Diagnosis not present

## 2021-10-16 DIAGNOSIS — E785 Hyperlipidemia, unspecified: Secondary | ICD-10-CM | POA: Diagnosis not present

## 2021-10-16 DIAGNOSIS — E876 Hypokalemia: Secondary | ICD-10-CM | POA: Diagnosis not present

## 2021-10-16 DIAGNOSIS — R2681 Unsteadiness on feet: Secondary | ICD-10-CM | POA: Diagnosis not present

## 2021-10-16 DIAGNOSIS — I4811 Longstanding persistent atrial fibrillation: Secondary | ICD-10-CM | POA: Diagnosis not present

## 2021-10-16 DIAGNOSIS — R278 Other lack of coordination: Secondary | ICD-10-CM | POA: Diagnosis not present

## 2021-10-16 DIAGNOSIS — E1165 Type 2 diabetes mellitus with hyperglycemia: Secondary | ICD-10-CM | POA: Diagnosis not present

## 2021-10-16 DIAGNOSIS — M2550 Pain in unspecified joint: Secondary | ICD-10-CM | POA: Diagnosis not present

## 2021-10-16 DIAGNOSIS — I4891 Unspecified atrial fibrillation: Secondary | ICD-10-CM | POA: Diagnosis not present

## 2021-10-16 DIAGNOSIS — E1169 Type 2 diabetes mellitus with other specified complication: Secondary | ICD-10-CM | POA: Diagnosis not present

## 2021-10-16 DIAGNOSIS — R262 Difficulty in walking, not elsewhere classified: Secondary | ICD-10-CM | POA: Diagnosis not present

## 2021-10-16 DIAGNOSIS — E119 Type 2 diabetes mellitus without complications: Secondary | ICD-10-CM | POA: Diagnosis not present

## 2021-10-16 DIAGNOSIS — I619 Nontraumatic intracerebral hemorrhage, unspecified: Secondary | ICD-10-CM | POA: Diagnosis not present

## 2021-10-16 DIAGNOSIS — M6281 Muscle weakness (generalized): Secondary | ICD-10-CM | POA: Diagnosis not present

## 2021-10-16 DIAGNOSIS — N390 Urinary tract infection, site not specified: Secondary | ICD-10-CM | POA: Diagnosis not present

## 2021-10-17 DIAGNOSIS — R2681 Unsteadiness on feet: Secondary | ICD-10-CM | POA: Diagnosis not present

## 2021-10-17 DIAGNOSIS — E1165 Type 2 diabetes mellitus with hyperglycemia: Secondary | ICD-10-CM | POA: Diagnosis not present

## 2021-10-17 DIAGNOSIS — E785 Hyperlipidemia, unspecified: Secondary | ICD-10-CM | POA: Diagnosis not present

## 2021-10-17 DIAGNOSIS — M6281 Muscle weakness (generalized): Secondary | ICD-10-CM | POA: Diagnosis not present

## 2021-10-17 DIAGNOSIS — R278 Other lack of coordination: Secondary | ICD-10-CM | POA: Diagnosis not present

## 2021-10-17 DIAGNOSIS — E1169 Type 2 diabetes mellitus with other specified complication: Secondary | ICD-10-CM | POA: Diagnosis not present

## 2021-10-17 DIAGNOSIS — I4811 Longstanding persistent atrial fibrillation: Secondary | ICD-10-CM | POA: Diagnosis not present

## 2021-10-17 DIAGNOSIS — I619 Nontraumatic intracerebral hemorrhage, unspecified: Secondary | ICD-10-CM | POA: Diagnosis not present

## 2021-10-17 DIAGNOSIS — R262 Difficulty in walking, not elsewhere classified: Secondary | ICD-10-CM | POA: Diagnosis not present

## 2021-10-18 DIAGNOSIS — E785 Hyperlipidemia, unspecified: Secondary | ICD-10-CM | POA: Diagnosis not present

## 2021-10-18 DIAGNOSIS — E1165 Type 2 diabetes mellitus with hyperglycemia: Secondary | ICD-10-CM | POA: Diagnosis not present

## 2021-10-18 DIAGNOSIS — R278 Other lack of coordination: Secondary | ICD-10-CM | POA: Diagnosis not present

## 2021-10-18 DIAGNOSIS — R2681 Unsteadiness on feet: Secondary | ICD-10-CM | POA: Diagnosis not present

## 2021-10-18 DIAGNOSIS — I10 Essential (primary) hypertension: Secondary | ICD-10-CM | POA: Diagnosis not present

## 2021-10-18 DIAGNOSIS — E1169 Type 2 diabetes mellitus with other specified complication: Secondary | ICD-10-CM | POA: Diagnosis not present

## 2021-10-18 DIAGNOSIS — I69359 Hemiplegia and hemiparesis following cerebral infarction affecting unspecified side: Secondary | ICD-10-CM | POA: Diagnosis not present

## 2021-10-18 DIAGNOSIS — I619 Nontraumatic intracerebral hemorrhage, unspecified: Secondary | ICD-10-CM | POA: Diagnosis not present

## 2021-10-18 DIAGNOSIS — M6281 Muscle weakness (generalized): Secondary | ICD-10-CM | POA: Diagnosis not present

## 2021-10-18 DIAGNOSIS — R262 Difficulty in walking, not elsewhere classified: Secondary | ICD-10-CM | POA: Diagnosis not present

## 2021-10-18 DIAGNOSIS — I4811 Longstanding persistent atrial fibrillation: Secondary | ICD-10-CM | POA: Diagnosis not present

## 2021-10-21 DIAGNOSIS — E785 Hyperlipidemia, unspecified: Secondary | ICD-10-CM | POA: Diagnosis not present

## 2021-10-21 DIAGNOSIS — I4811 Longstanding persistent atrial fibrillation: Secondary | ICD-10-CM | POA: Diagnosis not present

## 2021-10-21 DIAGNOSIS — R2681 Unsteadiness on feet: Secondary | ICD-10-CM | POA: Diagnosis not present

## 2021-10-21 DIAGNOSIS — R278 Other lack of coordination: Secondary | ICD-10-CM | POA: Diagnosis not present

## 2021-10-21 DIAGNOSIS — E1165 Type 2 diabetes mellitus with hyperglycemia: Secondary | ICD-10-CM | POA: Diagnosis not present

## 2021-10-21 DIAGNOSIS — E1169 Type 2 diabetes mellitus with other specified complication: Secondary | ICD-10-CM | POA: Diagnosis not present

## 2021-10-21 DIAGNOSIS — I619 Nontraumatic intracerebral hemorrhage, unspecified: Secondary | ICD-10-CM | POA: Diagnosis not present

## 2021-10-21 DIAGNOSIS — R262 Difficulty in walking, not elsewhere classified: Secondary | ICD-10-CM | POA: Diagnosis not present

## 2021-10-21 DIAGNOSIS — M6281 Muscle weakness (generalized): Secondary | ICD-10-CM | POA: Diagnosis not present

## 2021-10-22 DIAGNOSIS — E785 Hyperlipidemia, unspecified: Secondary | ICD-10-CM | POA: Diagnosis not present

## 2021-10-22 DIAGNOSIS — I619 Nontraumatic intracerebral hemorrhage, unspecified: Secondary | ICD-10-CM | POA: Diagnosis not present

## 2021-10-22 DIAGNOSIS — M6281 Muscle weakness (generalized): Secondary | ICD-10-CM | POA: Diagnosis not present

## 2021-10-22 DIAGNOSIS — R262 Difficulty in walking, not elsewhere classified: Secondary | ICD-10-CM | POA: Diagnosis not present

## 2021-10-22 DIAGNOSIS — R2681 Unsteadiness on feet: Secondary | ICD-10-CM | POA: Diagnosis not present

## 2021-10-22 DIAGNOSIS — R278 Other lack of coordination: Secondary | ICD-10-CM | POA: Diagnosis not present

## 2021-10-22 DIAGNOSIS — E1169 Type 2 diabetes mellitus with other specified complication: Secondary | ICD-10-CM | POA: Diagnosis not present

## 2021-10-22 DIAGNOSIS — E1165 Type 2 diabetes mellitus with hyperglycemia: Secondary | ICD-10-CM | POA: Diagnosis not present

## 2021-10-22 DIAGNOSIS — I4811 Longstanding persistent atrial fibrillation: Secondary | ICD-10-CM | POA: Diagnosis not present

## 2021-10-22 NOTE — Telephone Encounter (Signed)
Patient canceled appointments stated he would reschedule.  ?12/4 and 1/27 were canceled by patient. Nothing scheduled at this time. ?

## 2021-10-23 DIAGNOSIS — M6281 Muscle weakness (generalized): Secondary | ICD-10-CM | POA: Diagnosis not present

## 2021-10-23 DIAGNOSIS — I4811 Longstanding persistent atrial fibrillation: Secondary | ICD-10-CM | POA: Diagnosis not present

## 2021-10-23 DIAGNOSIS — E1169 Type 2 diabetes mellitus with other specified complication: Secondary | ICD-10-CM | POA: Diagnosis not present

## 2021-10-23 DIAGNOSIS — E785 Hyperlipidemia, unspecified: Secondary | ICD-10-CM | POA: Diagnosis not present

## 2021-10-23 DIAGNOSIS — E1165 Type 2 diabetes mellitus with hyperglycemia: Secondary | ICD-10-CM | POA: Diagnosis not present

## 2021-10-23 DIAGNOSIS — R2681 Unsteadiness on feet: Secondary | ICD-10-CM | POA: Diagnosis not present

## 2021-10-23 DIAGNOSIS — R262 Difficulty in walking, not elsewhere classified: Secondary | ICD-10-CM | POA: Diagnosis not present

## 2021-10-23 DIAGNOSIS — R278 Other lack of coordination: Secondary | ICD-10-CM | POA: Diagnosis not present

## 2021-10-23 DIAGNOSIS — I619 Nontraumatic intracerebral hemorrhage, unspecified: Secondary | ICD-10-CM | POA: Diagnosis not present

## 2021-10-24 DIAGNOSIS — E1169 Type 2 diabetes mellitus with other specified complication: Secondary | ICD-10-CM | POA: Diagnosis not present

## 2021-10-24 DIAGNOSIS — E785 Hyperlipidemia, unspecified: Secondary | ICD-10-CM | POA: Diagnosis not present

## 2021-10-24 DIAGNOSIS — R278 Other lack of coordination: Secondary | ICD-10-CM | POA: Diagnosis not present

## 2021-10-24 DIAGNOSIS — M6281 Muscle weakness (generalized): Secondary | ICD-10-CM | POA: Diagnosis not present

## 2021-10-24 DIAGNOSIS — I4811 Longstanding persistent atrial fibrillation: Secondary | ICD-10-CM | POA: Diagnosis not present

## 2021-10-24 DIAGNOSIS — R2681 Unsteadiness on feet: Secondary | ICD-10-CM | POA: Diagnosis not present

## 2021-10-24 DIAGNOSIS — I619 Nontraumatic intracerebral hemorrhage, unspecified: Secondary | ICD-10-CM | POA: Diagnosis not present

## 2021-10-24 DIAGNOSIS — E1165 Type 2 diabetes mellitus with hyperglycemia: Secondary | ICD-10-CM | POA: Diagnosis not present

## 2021-10-24 DIAGNOSIS — R262 Difficulty in walking, not elsewhere classified: Secondary | ICD-10-CM | POA: Diagnosis not present

## 2021-10-25 DIAGNOSIS — E1165 Type 2 diabetes mellitus with hyperglycemia: Secondary | ICD-10-CM | POA: Diagnosis not present

## 2021-10-25 DIAGNOSIS — R262 Difficulty in walking, not elsewhere classified: Secondary | ICD-10-CM | POA: Diagnosis not present

## 2021-10-25 DIAGNOSIS — M6281 Muscle weakness (generalized): Secondary | ICD-10-CM | POA: Diagnosis not present

## 2021-10-25 DIAGNOSIS — R2681 Unsteadiness on feet: Secondary | ICD-10-CM | POA: Diagnosis not present

## 2021-10-25 DIAGNOSIS — R278 Other lack of coordination: Secondary | ICD-10-CM | POA: Diagnosis not present

## 2021-10-25 DIAGNOSIS — E785 Hyperlipidemia, unspecified: Secondary | ICD-10-CM | POA: Diagnosis not present

## 2021-10-25 DIAGNOSIS — I4811 Longstanding persistent atrial fibrillation: Secondary | ICD-10-CM | POA: Diagnosis not present

## 2021-10-25 DIAGNOSIS — I619 Nontraumatic intracerebral hemorrhage, unspecified: Secondary | ICD-10-CM | POA: Diagnosis not present

## 2021-10-25 DIAGNOSIS — E1169 Type 2 diabetes mellitus with other specified complication: Secondary | ICD-10-CM | POA: Diagnosis not present

## 2021-10-26 DIAGNOSIS — M6281 Muscle weakness (generalized): Secondary | ICD-10-CM | POA: Diagnosis not present

## 2021-10-26 DIAGNOSIS — R2681 Unsteadiness on feet: Secondary | ICD-10-CM | POA: Diagnosis not present

## 2021-10-26 DIAGNOSIS — E1169 Type 2 diabetes mellitus with other specified complication: Secondary | ICD-10-CM | POA: Diagnosis not present

## 2021-10-26 DIAGNOSIS — I619 Nontraumatic intracerebral hemorrhage, unspecified: Secondary | ICD-10-CM | POA: Diagnosis not present

## 2021-10-26 DIAGNOSIS — I4811 Longstanding persistent atrial fibrillation: Secondary | ICD-10-CM | POA: Diagnosis not present

## 2021-10-26 DIAGNOSIS — E1165 Type 2 diabetes mellitus with hyperglycemia: Secondary | ICD-10-CM | POA: Diagnosis not present

## 2021-10-26 DIAGNOSIS — R262 Difficulty in walking, not elsewhere classified: Secondary | ICD-10-CM | POA: Diagnosis not present

## 2021-10-26 DIAGNOSIS — E785 Hyperlipidemia, unspecified: Secondary | ICD-10-CM | POA: Diagnosis not present

## 2021-10-26 DIAGNOSIS — R278 Other lack of coordination: Secondary | ICD-10-CM | POA: Diagnosis not present

## 2021-10-28 DIAGNOSIS — I4811 Longstanding persistent atrial fibrillation: Secondary | ICD-10-CM | POA: Diagnosis not present

## 2021-10-28 DIAGNOSIS — E1169 Type 2 diabetes mellitus with other specified complication: Secondary | ICD-10-CM | POA: Diagnosis not present

## 2021-10-28 DIAGNOSIS — E785 Hyperlipidemia, unspecified: Secondary | ICD-10-CM | POA: Diagnosis not present

## 2021-10-28 DIAGNOSIS — I619 Nontraumatic intracerebral hemorrhage, unspecified: Secondary | ICD-10-CM | POA: Diagnosis not present

## 2021-10-28 DIAGNOSIS — R2681 Unsteadiness on feet: Secondary | ICD-10-CM | POA: Diagnosis not present

## 2021-10-28 DIAGNOSIS — E1165 Type 2 diabetes mellitus with hyperglycemia: Secondary | ICD-10-CM | POA: Diagnosis not present

## 2021-10-28 DIAGNOSIS — M6281 Muscle weakness (generalized): Secondary | ICD-10-CM | POA: Diagnosis not present

## 2021-10-28 DIAGNOSIS — R278 Other lack of coordination: Secondary | ICD-10-CM | POA: Diagnosis not present

## 2021-10-28 DIAGNOSIS — R262 Difficulty in walking, not elsewhere classified: Secondary | ICD-10-CM | POA: Diagnosis not present

## 2021-10-29 DIAGNOSIS — R278 Other lack of coordination: Secondary | ICD-10-CM | POA: Diagnosis not present

## 2021-10-29 DIAGNOSIS — R2681 Unsteadiness on feet: Secondary | ICD-10-CM | POA: Diagnosis not present

## 2021-10-29 DIAGNOSIS — E1169 Type 2 diabetes mellitus with other specified complication: Secondary | ICD-10-CM | POA: Diagnosis not present

## 2021-10-29 DIAGNOSIS — I619 Nontraumatic intracerebral hemorrhage, unspecified: Secondary | ICD-10-CM | POA: Diagnosis not present

## 2021-10-29 DIAGNOSIS — I4811 Longstanding persistent atrial fibrillation: Secondary | ICD-10-CM | POA: Diagnosis not present

## 2021-10-29 DIAGNOSIS — M6281 Muscle weakness (generalized): Secondary | ICD-10-CM | POA: Diagnosis not present

## 2021-10-29 DIAGNOSIS — E785 Hyperlipidemia, unspecified: Secondary | ICD-10-CM | POA: Diagnosis not present

## 2021-10-29 DIAGNOSIS — R262 Difficulty in walking, not elsewhere classified: Secondary | ICD-10-CM | POA: Diagnosis not present

## 2021-10-29 DIAGNOSIS — E1165 Type 2 diabetes mellitus with hyperglycemia: Secondary | ICD-10-CM | POA: Diagnosis not present

## 2021-10-30 DIAGNOSIS — E785 Hyperlipidemia, unspecified: Secondary | ICD-10-CM | POA: Diagnosis not present

## 2021-10-30 DIAGNOSIS — I4811 Longstanding persistent atrial fibrillation: Secondary | ICD-10-CM | POA: Diagnosis not present

## 2021-10-30 DIAGNOSIS — R262 Difficulty in walking, not elsewhere classified: Secondary | ICD-10-CM | POA: Diagnosis not present

## 2021-10-30 DIAGNOSIS — E1169 Type 2 diabetes mellitus with other specified complication: Secondary | ICD-10-CM | POA: Diagnosis not present

## 2021-10-30 DIAGNOSIS — R278 Other lack of coordination: Secondary | ICD-10-CM | POA: Diagnosis not present

## 2021-10-30 DIAGNOSIS — M6281 Muscle weakness (generalized): Secondary | ICD-10-CM | POA: Diagnosis not present

## 2021-10-30 DIAGNOSIS — R2681 Unsteadiness on feet: Secondary | ICD-10-CM | POA: Diagnosis not present

## 2021-10-30 DIAGNOSIS — I619 Nontraumatic intracerebral hemorrhage, unspecified: Secondary | ICD-10-CM | POA: Diagnosis not present

## 2021-10-30 DIAGNOSIS — E1165 Type 2 diabetes mellitus with hyperglycemia: Secondary | ICD-10-CM | POA: Diagnosis not present

## 2021-10-31 DIAGNOSIS — I10 Essential (primary) hypertension: Secondary | ICD-10-CM | POA: Diagnosis not present

## 2021-10-31 DIAGNOSIS — E1169 Type 2 diabetes mellitus with other specified complication: Secondary | ICD-10-CM | POA: Diagnosis not present

## 2021-10-31 DIAGNOSIS — R2681 Unsteadiness on feet: Secondary | ICD-10-CM | POA: Diagnosis not present

## 2021-10-31 DIAGNOSIS — M25511 Pain in right shoulder: Secondary | ICD-10-CM | POA: Diagnosis not present

## 2021-10-31 DIAGNOSIS — E1165 Type 2 diabetes mellitus with hyperglycemia: Secondary | ICD-10-CM | POA: Diagnosis not present

## 2021-10-31 DIAGNOSIS — N390 Urinary tract infection, site not specified: Secondary | ICD-10-CM | POA: Diagnosis not present

## 2021-10-31 DIAGNOSIS — I4811 Longstanding persistent atrial fibrillation: Secondary | ICD-10-CM | POA: Diagnosis not present

## 2021-10-31 DIAGNOSIS — G8191 Hemiplegia, unspecified affecting right dominant side: Secondary | ICD-10-CM | POA: Diagnosis not present

## 2021-10-31 DIAGNOSIS — I619 Nontraumatic intracerebral hemorrhage, unspecified: Secondary | ICD-10-CM | POA: Diagnosis not present

## 2021-10-31 DIAGNOSIS — R262 Difficulty in walking, not elsewhere classified: Secondary | ICD-10-CM | POA: Diagnosis not present

## 2021-10-31 DIAGNOSIS — I4891 Unspecified atrial fibrillation: Secondary | ICD-10-CM | POA: Diagnosis not present

## 2021-10-31 DIAGNOSIS — E785 Hyperlipidemia, unspecified: Secondary | ICD-10-CM | POA: Diagnosis not present

## 2021-10-31 DIAGNOSIS — R278 Other lack of coordination: Secondary | ICD-10-CM | POA: Diagnosis not present

## 2021-10-31 DIAGNOSIS — M6281 Muscle weakness (generalized): Secondary | ICD-10-CM | POA: Diagnosis not present

## 2021-11-01 DIAGNOSIS — M6281 Muscle weakness (generalized): Secondary | ICD-10-CM | POA: Diagnosis not present

## 2021-11-01 DIAGNOSIS — R2681 Unsteadiness on feet: Secondary | ICD-10-CM | POA: Diagnosis not present

## 2021-11-01 DIAGNOSIS — I4811 Longstanding persistent atrial fibrillation: Secondary | ICD-10-CM | POA: Diagnosis not present

## 2021-11-01 DIAGNOSIS — I619 Nontraumatic intracerebral hemorrhage, unspecified: Secondary | ICD-10-CM | POA: Diagnosis not present

## 2021-11-01 DIAGNOSIS — E1169 Type 2 diabetes mellitus with other specified complication: Secondary | ICD-10-CM | POA: Diagnosis not present

## 2021-11-01 DIAGNOSIS — E785 Hyperlipidemia, unspecified: Secondary | ICD-10-CM | POA: Diagnosis not present

## 2021-11-01 DIAGNOSIS — R278 Other lack of coordination: Secondary | ICD-10-CM | POA: Diagnosis not present

## 2021-11-01 DIAGNOSIS — E1165 Type 2 diabetes mellitus with hyperglycemia: Secondary | ICD-10-CM | POA: Diagnosis not present

## 2021-11-01 DIAGNOSIS — R262 Difficulty in walking, not elsewhere classified: Secondary | ICD-10-CM | POA: Diagnosis not present

## 2021-11-03 DIAGNOSIS — R278 Other lack of coordination: Secondary | ICD-10-CM | POA: Diagnosis not present

## 2021-11-03 DIAGNOSIS — R262 Difficulty in walking, not elsewhere classified: Secondary | ICD-10-CM | POA: Diagnosis not present

## 2021-11-03 DIAGNOSIS — E1169 Type 2 diabetes mellitus with other specified complication: Secondary | ICD-10-CM | POA: Diagnosis not present

## 2021-11-03 DIAGNOSIS — I619 Nontraumatic intracerebral hemorrhage, unspecified: Secondary | ICD-10-CM | POA: Diagnosis not present

## 2021-11-03 DIAGNOSIS — R2681 Unsteadiness on feet: Secondary | ICD-10-CM | POA: Diagnosis not present

## 2021-11-03 DIAGNOSIS — E785 Hyperlipidemia, unspecified: Secondary | ICD-10-CM | POA: Diagnosis not present

## 2021-11-03 DIAGNOSIS — M6281 Muscle weakness (generalized): Secondary | ICD-10-CM | POA: Diagnosis not present

## 2021-11-03 DIAGNOSIS — E1165 Type 2 diabetes mellitus with hyperglycemia: Secondary | ICD-10-CM | POA: Diagnosis not present

## 2021-11-03 DIAGNOSIS — I4811 Longstanding persistent atrial fibrillation: Secondary | ICD-10-CM | POA: Diagnosis not present

## 2021-11-04 DIAGNOSIS — M6281 Muscle weakness (generalized): Secondary | ICD-10-CM | POA: Diagnosis not present

## 2021-11-04 DIAGNOSIS — R278 Other lack of coordination: Secondary | ICD-10-CM | POA: Diagnosis not present

## 2021-11-04 DIAGNOSIS — R2681 Unsteadiness on feet: Secondary | ICD-10-CM | POA: Diagnosis not present

## 2021-11-04 DIAGNOSIS — I619 Nontraumatic intracerebral hemorrhage, unspecified: Secondary | ICD-10-CM | POA: Diagnosis not present

## 2021-11-04 DIAGNOSIS — E785 Hyperlipidemia, unspecified: Secondary | ICD-10-CM | POA: Diagnosis not present

## 2021-11-04 DIAGNOSIS — R262 Difficulty in walking, not elsewhere classified: Secondary | ICD-10-CM | POA: Diagnosis not present

## 2021-11-04 DIAGNOSIS — E1169 Type 2 diabetes mellitus with other specified complication: Secondary | ICD-10-CM | POA: Diagnosis not present

## 2021-11-04 DIAGNOSIS — E1165 Type 2 diabetes mellitus with hyperglycemia: Secondary | ICD-10-CM | POA: Diagnosis not present

## 2021-11-04 DIAGNOSIS — I4811 Longstanding persistent atrial fibrillation: Secondary | ICD-10-CM | POA: Diagnosis not present

## 2021-11-06 DIAGNOSIS — E785 Hyperlipidemia, unspecified: Secondary | ICD-10-CM | POA: Diagnosis not present

## 2021-11-06 DIAGNOSIS — I4811 Longstanding persistent atrial fibrillation: Secondary | ICD-10-CM | POA: Diagnosis not present

## 2021-11-06 DIAGNOSIS — I619 Nontraumatic intracerebral hemorrhage, unspecified: Secondary | ICD-10-CM | POA: Diagnosis not present

## 2021-11-06 DIAGNOSIS — E1169 Type 2 diabetes mellitus with other specified complication: Secondary | ICD-10-CM | POA: Diagnosis not present

## 2021-11-06 DIAGNOSIS — R2681 Unsteadiness on feet: Secondary | ICD-10-CM | POA: Diagnosis not present

## 2021-11-06 DIAGNOSIS — R262 Difficulty in walking, not elsewhere classified: Secondary | ICD-10-CM | POA: Diagnosis not present

## 2021-11-06 DIAGNOSIS — E1165 Type 2 diabetes mellitus with hyperglycemia: Secondary | ICD-10-CM | POA: Diagnosis not present

## 2021-11-06 DIAGNOSIS — M6281 Muscle weakness (generalized): Secondary | ICD-10-CM | POA: Diagnosis not present

## 2021-11-06 DIAGNOSIS — R278 Other lack of coordination: Secondary | ICD-10-CM | POA: Diagnosis not present

## 2021-11-07 DIAGNOSIS — R2681 Unsteadiness on feet: Secondary | ICD-10-CM | POA: Diagnosis not present

## 2021-11-07 DIAGNOSIS — E1165 Type 2 diabetes mellitus with hyperglycemia: Secondary | ICD-10-CM | POA: Diagnosis not present

## 2021-11-07 DIAGNOSIS — E1169 Type 2 diabetes mellitus with other specified complication: Secondary | ICD-10-CM | POA: Diagnosis not present

## 2021-11-07 DIAGNOSIS — I4811 Longstanding persistent atrial fibrillation: Secondary | ICD-10-CM | POA: Diagnosis not present

## 2021-11-07 DIAGNOSIS — I619 Nontraumatic intracerebral hemorrhage, unspecified: Secondary | ICD-10-CM | POA: Diagnosis not present

## 2021-11-07 DIAGNOSIS — R262 Difficulty in walking, not elsewhere classified: Secondary | ICD-10-CM | POA: Diagnosis not present

## 2021-11-07 DIAGNOSIS — R278 Other lack of coordination: Secondary | ICD-10-CM | POA: Diagnosis not present

## 2021-11-07 DIAGNOSIS — E785 Hyperlipidemia, unspecified: Secondary | ICD-10-CM | POA: Diagnosis not present

## 2021-11-07 DIAGNOSIS — M6281 Muscle weakness (generalized): Secondary | ICD-10-CM | POA: Diagnosis not present

## 2021-11-08 ENCOUNTER — Encounter: Payer: Self-pay | Admitting: Nurse Practitioner

## 2021-11-08 ENCOUNTER — Ambulatory Visit (INDEPENDENT_AMBULATORY_CARE_PROVIDER_SITE_OTHER): Payer: Medicare Other | Admitting: Nurse Practitioner

## 2021-11-08 VITALS — BP 135/91 | HR 93 | Ht 72.0 in | Wt 309.0 lb

## 2021-11-08 DIAGNOSIS — E1165 Type 2 diabetes mellitus with hyperglycemia: Secondary | ICD-10-CM | POA: Diagnosis not present

## 2021-11-08 DIAGNOSIS — I4811 Longstanding persistent atrial fibrillation: Secondary | ICD-10-CM | POA: Diagnosis not present

## 2021-11-08 DIAGNOSIS — D6859 Other primary thrombophilia: Secondary | ICD-10-CM | POA: Diagnosis not present

## 2021-11-08 DIAGNOSIS — I4819 Other persistent atrial fibrillation: Secondary | ICD-10-CM | POA: Diagnosis not present

## 2021-11-08 DIAGNOSIS — E669 Obesity, unspecified: Secondary | ICD-10-CM

## 2021-11-08 DIAGNOSIS — I619 Nontraumatic intracerebral hemorrhage, unspecified: Secondary | ICD-10-CM | POA: Diagnosis not present

## 2021-11-08 DIAGNOSIS — I519 Heart disease, unspecified: Secondary | ICD-10-CM | POA: Diagnosis not present

## 2021-11-08 DIAGNOSIS — I69359 Hemiplegia and hemiparesis following cerebral infarction affecting unspecified side: Secondary | ICD-10-CM | POA: Diagnosis not present

## 2021-11-08 DIAGNOSIS — E1169 Type 2 diabetes mellitus with other specified complication: Secondary | ICD-10-CM

## 2021-11-08 DIAGNOSIS — Z8673 Personal history of transient ischemic attack (TIA), and cerebral infarction without residual deficits: Secondary | ICD-10-CM | POA: Diagnosis not present

## 2021-11-08 DIAGNOSIS — R2681 Unsteadiness on feet: Secondary | ICD-10-CM | POA: Diagnosis not present

## 2021-11-08 DIAGNOSIS — E785 Hyperlipidemia, unspecified: Secondary | ICD-10-CM | POA: Diagnosis not present

## 2021-11-08 DIAGNOSIS — G4733 Obstructive sleep apnea (adult) (pediatric): Secondary | ICD-10-CM

## 2021-11-08 DIAGNOSIS — I452 Bifascicular block: Secondary | ICD-10-CM

## 2021-11-08 DIAGNOSIS — I10 Essential (primary) hypertension: Secondary | ICD-10-CM

## 2021-11-08 DIAGNOSIS — R278 Other lack of coordination: Secondary | ICD-10-CM | POA: Diagnosis not present

## 2021-11-08 DIAGNOSIS — M6281 Muscle weakness (generalized): Secondary | ICD-10-CM | POA: Diagnosis not present

## 2021-11-08 DIAGNOSIS — R262 Difficulty in walking, not elsewhere classified: Secondary | ICD-10-CM | POA: Diagnosis not present

## 2021-11-08 NOTE — Patient Instructions (Signed)
Medication Instructions:  ?The current medical regimen is effective;  continue present plan and medications as directed. Please refer to the Current Medication list given to you today. ? ?*If you need a refill on your cardiac medications before your next appointment, please call your pharmacy* ? ?Lab Work:   Testing/Procedures:  ?NONE    NONE ? ?Follow-Up: ?Your next appointment:  3 month(s) In Person with Sanda Klein, MD  AND FOLLOW UP WITH DR Claiborne Billings 1st AVAILABLE FOR SLEEP ? ?At [facility], you and your health needs are our priority.  As part of our continuing mission to provide you with exceptional heart care, we have created designated Provider Care Teams.  These Care Teams include your primary Cardiologist (physician) and Advanced Practice Providers (APPs -  Physician Assistants and Nurse Practitioners) who all work together to provide you with the care you need, when you need it. ? ?We recommend signing up for the patient portal called "MyChart".  Sign up information is provided on this After Visit Summary.  MyChart is used to connect with patients for Virtual Visits (Telemedicine).  Patients are able to view lab/test results, encounter notes, upcoming appointments, etc.  Non-urgent messages can be sent to your provider as well.   ?To learn more about what you can do with MyChart, go to [email or web address].   ? ? ? ?     ?

## 2021-11-08 NOTE — Progress Notes (Signed)
? ? ?Office Visit  ?  ?Patient Name: Darius Ingram ?Date of Encounter: 11/08/2021 ? ?Primary Care Provider:  Horald Pollen, MD ?Primary Cardiologist:  Sanda Klein, MD ? ?Chief Complaint  ?  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 ? ?Past Medical History  ?  ?Past Medical History:  ?Diagnosis Date  ? Allergy   ? seasonal allergies  ? Atrial fibrillation ([place])   ? Diabetes mellitus without complication ([place])   ? Hyperlipidemia   ? Hypertension   ? Hypothyroidism   ? Joint pain   ? SOBOE (shortness of breath on exertion)   ? ?Past Surgical History:  ?Procedure Laterality Date  ? CARDIOVERSION N/A 01/09/2021  ? Procedure: CARDIOVERSION;  Surgeon: [name], MD;  Location: [facility];  Service: Cardiovascular;  Laterality: N/A;  ? TONSILECTOMY, ADENOIDECTOMY, [place]  ? TYMPANOSTOMY  1960  ? [place]  ? ? ?Allergies ? ?Allergies  ?Allergen Reactions  ? Penicillins Rash  ?  Reaction: 50 years ago  ? ? ?History of Present Illness  ?  ?66 year old male with the above past medical history including persistent atrial fibrillation on Pradaxa due to h/o CVA while on Eliquis, mild left ventricular systolic dysfunction (EF 45 to 50%, now 55 to 60%), bifascicular block, hypertension, hyperlipidemia, OSA, type 2 diabetes, and hypothyroidism. ? ?He was diagnosed with atrial fibrillation in March 2022 during [facility] evaluation [facility].  He underwent successful DCCV on 01/09/2021 and had been maintaining sinus rhythm since that time.  He was on Eliquis for anticoagulation.  EF was 45 to 50% in April 2022, showed mildly decreased LV function.  He was last seen in the office on 05/31/2021 and was stable from a cardiac standpoint.  He was maintaining sinus rhythm at the time.  Unfortunately, he presented to the ED on 07/25/2021 after a fall with right-sided weakness, slurred speech, and facial droop.  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 He was treated with Andexxa for anticoagulant reversal. CT of the head showed slowing left basal ganglia small hematoma either due to Eliquis use or from hemorrhagic conversion from ischemic stroke in the setting of A. fib with Eliquis failure. MRI brain also showed an acute small infarct in the left frontal lobe.  Neurology was consulted and ultimately recommended anticoagulation for atrial fibrillation with Pradaxa following resolution of ICH.  Echocardiogram showed EF 55 to 60%, basal to mid inferior hypokinesis, moderate concentric LVH, G1 DD, mild dilation of ascending aorta, 40 mm. Carotid Doppler showed 1 to 39% B ICA stenosis.  He was discharged to skilled nursing on 07/31/2021. ? ?He presents today for follow-up.  Since his hospitalization he has been stable from a cardiac standpoint.  He denies dyspnea, palpitations, edema, PND, orthopnea, denies concerning for angina.  He is working hard on increasing his physical activity following his recent stroke. He is able to walk several steps with a walker and standby assistance.  He has not had much follow-up outside of his doctor at the skilled nursing facility since his hospitalization.  He states he was unable to complete his CPAP titration for  OSA due to the need for around-the-clock assistance and inability to complete overnight stay.  Overall, he reports feeling well and denies any specific concerns today. ? ?Home Medications  ?  ?Current Outpatient Medications  ?Medication Sig Dispense Refill  ? acetaminophen (TYLENOL) 325 MG tablet Take 2 tablets (650 mg  total) by mouth every 4 (four) hours as needed for mild pain (or temp > 37.5 C (99.5 F)).    ? acetaminophen (TYLENOL) 650 MG CR tablet Take 650 mg by mouth daily.    ? amLODipine (NORVASC) 10 MG tablet Take 1 tablet (10 mg total) by mouth daily. 90 tablet 3  ? atorvastatin (LIPITOR) 40 MG tablet Take 1 tablet (40 mg total) by mouth daily. (Patient taking differently: Take 40 mg by mouth daily.) 90 tablet 3  ? calcium carbonate (TUMS EX) 750 MG chewable tablet Chew 750-1,500 mg by mouth daily as needed for heartburn.    ? cetirizine (ZYRTEC) 10 MG tablet Take 1 tablet (10 mg total) by mouth daily. 90 tablet 3  ? Dabigatran Etexilate Mesylate (PRADAXA) 110 MG CAPS Take 110 mg by mouth 2 (two) times daily.    ? Dulaglutide (TRULICITY) 1.5 TD/1.7OH SOPN Inject 1.5 mg into the skin once a week. 6 mL 5  ? fluticasone (FLONASE) 50 MCG/ACT nasal spray Place 2 sprays into both nostrils daily. (Patient taking differently: Place 2 sprays into both nostrils daily as needed for allergies.) 16 g 3  ? insulin aspart (NOVOLOG) 100 UNIT/ML injection Inject 0-20 Units into the skin 3 (three) times daily with meals. 10 mL 11  ? lisinopril (ZESTRIL) 20 MG tablet Take 1 tablet (20 mg total) by mouth daily. (Patient taking differently: Take 20 mg by mouth daily.) 90 tablet 3  ? metoprolol tartrate (LOPRESSOR) 25 MG tablet Take 1 tablet (25 mg total) by mouth 2 (two) times daily.    ? Multiple Vitamin (MULTIVITAMIN PO) Take 1 tablet by mouth daily.    ? pantoprazole (PROTONIX) 40 MG tablet Take 1 tablet (40 mg total) by mouth at bedtime.    ? senna-docusate (SENOKOT-S) 8.6-50 MG tablet Take 1 tablet by mouth 2 (two) times daily.    ? metFORMIN (GLUCOPHAGE) 1000 MG tablet Take 1 tablet (1,000 mg total) by mouth 2 (two) times daily with a meal. 180 tablet 3  ? ?No current facility-administered medications for this visit.  ?  ? ?Review of Systems  ?  ?He denies chest pain, palpitations, dyspnea, pnd, orthopnea, n, v, dizziness, syncope,  edema, weight gain, or early satiety. All other systems reviewed and are otherwise negative except as noted above.  ? ?Physical Exam  ?  ?VS:  BP (!) 135/91   Pulse 93   Ht 6' (1.829 m)   Wt (!) 309 lb (140.2 kg)   SpO2 97%   BMI 41.91 kg/m?  ?GEN: Well nourished, well developed, in no acute distress. ?HEENT: normal. ?Neck: Supple, no JVD, carotid bruits, or masses. ?Cardiac: IRIR, no murmurs, rubs, or gallops. No clubbing, cyanosis, edema.  Radials/DP/PT 2+ and equal bilaterally.  ?Respiratory:  Respirations regular and unlabored, clear to auscultation bilaterally. ?GI: Obese, soft, nontender, nondistended, BS + x 4. ?MS: no deformity or atrophy. ?Skin: warm and dry, no rash. ?Neuro:  Strength and sensation are intact. ?Psych: Normal affect. ? ?Accessory Clinical Findings  ?  ?ECG personally reviewed by me today - No EKG in office today.  ? ?Lab Results  ?Component Value Date  ? WBC 13.8 (H) 07/30/2021  ?  HGB 13.9 07/30/2021  ? HCT 41.9 07/30/2021  ? MCV 89.1 07/30/2021  ? PLT 230 07/30/2021  ? ?Lab Results  ?Component Value Date  ? CREATININE 0.96 07/30/2021  ? BUN 22 07/30/2021  ? NA 138 07/30/2021  ? K 3.6 07/30/2021  ? CL 105 07/30/2021  ? CO2 25 07/30/2021  ? ?Lab Results  ?Component Value Date  ? ALT 23 07/25/2021  ? AST 24 07/25/2021  ? ALKPHOS 76 07/25/2021  ? BILITOT 0.7 07/25/2021  ? ?Lab Results  ?Component Value Date  ? CHOL 123 07/25/2021  ? HDL 29 (L) 07/25/2021  ? [place] 75 07/25/2021  ? TRIG 96 07/25/2021  ? CHOLHDL 4.2 07/25/2021  ?  ?Lab Results  ?Component Value Date  ? HGBA1C 6.3 (H) 07/25/2021  ? ? ?Assessment & Plan  ?  ?1. Persistent atrial fibrillation: No EKG in office today, however, upon auscultation, he has a regular rate and rhythm. He is asymptomatic. Case discussed with Dr. Sallyanne Kuster, primary cardiologist, for now, will continue with rate strategy. Continue Pradaxa, metoprolol. ? ?2. 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 He is now on Pradaxa as above.  He has residual right-sided weakness, dysarthria. Recommend follow-up with neurology. ? ?3. Mild left ventricular systolic dysfunction: EF 45 to 50%, now 55 to 60%. Eu

## 2021-11-09 DIAGNOSIS — I4811 Longstanding persistent atrial fibrillation: Secondary | ICD-10-CM | POA: Diagnosis not present

## 2021-11-09 DIAGNOSIS — I619 Nontraumatic intracerebral hemorrhage, unspecified: Secondary | ICD-10-CM | POA: Diagnosis not present

## 2021-11-09 DIAGNOSIS — E1169 Type 2 diabetes mellitus with other specified complication: Secondary | ICD-10-CM | POA: Diagnosis not present

## 2021-11-09 DIAGNOSIS — E785 Hyperlipidemia, unspecified: Secondary | ICD-10-CM | POA: Diagnosis not present

## 2021-11-09 DIAGNOSIS — M6281 Muscle weakness (generalized): Secondary | ICD-10-CM | POA: Diagnosis not present

## 2021-11-09 DIAGNOSIS — E1165 Type 2 diabetes mellitus with hyperglycemia: Secondary | ICD-10-CM | POA: Diagnosis not present

## 2021-11-09 DIAGNOSIS — R262 Difficulty in walking, not elsewhere classified: Secondary | ICD-10-CM | POA: Diagnosis not present

## 2021-11-09 DIAGNOSIS — R2681 Unsteadiness on feet: Secondary | ICD-10-CM | POA: Diagnosis not present

## 2021-11-09 DIAGNOSIS — R278 Other lack of coordination: Secondary | ICD-10-CM | POA: Diagnosis not present

## 2021-11-11 DIAGNOSIS — I5042 Chronic combined systolic (congestive) and diastolic (congestive) heart failure: Secondary | ICD-10-CM | POA: Diagnosis not present

## 2021-11-11 DIAGNOSIS — I619 Nontraumatic intracerebral hemorrhage, unspecified: Secondary | ICD-10-CM | POA: Diagnosis not present

## 2021-11-11 DIAGNOSIS — R2681 Unsteadiness on feet: Secondary | ICD-10-CM | POA: Diagnosis not present

## 2021-11-11 DIAGNOSIS — E1169 Type 2 diabetes mellitus with other specified complication: Secondary | ICD-10-CM | POA: Diagnosis not present

## 2021-11-11 DIAGNOSIS — E785 Hyperlipidemia, unspecified: Secondary | ICD-10-CM | POA: Diagnosis not present

## 2021-11-11 DIAGNOSIS — M6281 Muscle weakness (generalized): Secondary | ICD-10-CM | POA: Diagnosis not present

## 2021-11-11 DIAGNOSIS — I4811 Longstanding persistent atrial fibrillation: Secondary | ICD-10-CM | POA: Diagnosis not present

## 2021-11-11 DIAGNOSIS — R278 Other lack of coordination: Secondary | ICD-10-CM | POA: Diagnosis not present

## 2021-11-11 DIAGNOSIS — R262 Difficulty in walking, not elsewhere classified: Secondary | ICD-10-CM | POA: Diagnosis not present

## 2021-11-11 DIAGNOSIS — E1165 Type 2 diabetes mellitus with hyperglycemia: Secondary | ICD-10-CM | POA: Diagnosis not present

## 2021-11-12 DIAGNOSIS — R278 Other lack of coordination: Secondary | ICD-10-CM | POA: Diagnosis not present

## 2021-11-12 DIAGNOSIS — I619 Nontraumatic intracerebral hemorrhage, unspecified: Secondary | ICD-10-CM | POA: Diagnosis not present

## 2021-11-12 DIAGNOSIS — R2681 Unsteadiness on feet: Secondary | ICD-10-CM | POA: Diagnosis not present

## 2021-11-12 DIAGNOSIS — I4811 Longstanding persistent atrial fibrillation: Secondary | ICD-10-CM | POA: Diagnosis not present

## 2021-11-12 DIAGNOSIS — E1165 Type 2 diabetes mellitus with hyperglycemia: Secondary | ICD-10-CM | POA: Diagnosis not present

## 2021-11-12 DIAGNOSIS — M6281 Muscle weakness (generalized): Secondary | ICD-10-CM | POA: Diagnosis not present

## 2021-11-12 DIAGNOSIS — E1169 Type 2 diabetes mellitus with other specified complication: Secondary | ICD-10-CM | POA: Diagnosis not present

## 2021-11-12 DIAGNOSIS — R262 Difficulty in walking, not elsewhere classified: Secondary | ICD-10-CM | POA: Diagnosis not present

## 2021-11-12 DIAGNOSIS — E785 Hyperlipidemia, unspecified: Secondary | ICD-10-CM | POA: Diagnosis not present

## 2021-11-13 DIAGNOSIS — R262 Difficulty in walking, not elsewhere classified: Secondary | ICD-10-CM | POA: Diagnosis not present

## 2021-11-13 DIAGNOSIS — E1169 Type 2 diabetes mellitus with other specified complication: Secondary | ICD-10-CM | POA: Diagnosis not present

## 2021-11-13 DIAGNOSIS — E1165 Type 2 diabetes mellitus with hyperglycemia: Secondary | ICD-10-CM | POA: Diagnosis not present

## 2021-11-13 DIAGNOSIS — I619 Nontraumatic intracerebral hemorrhage, unspecified: Secondary | ICD-10-CM | POA: Diagnosis not present

## 2021-11-13 DIAGNOSIS — R2681 Unsteadiness on feet: Secondary | ICD-10-CM | POA: Diagnosis not present

## 2021-11-13 DIAGNOSIS — M6281 Muscle weakness (generalized): Secondary | ICD-10-CM | POA: Diagnosis not present

## 2021-11-13 DIAGNOSIS — I4811 Longstanding persistent atrial fibrillation: Secondary | ICD-10-CM | POA: Diagnosis not present

## 2021-11-13 DIAGNOSIS — R278 Other lack of coordination: Secondary | ICD-10-CM | POA: Diagnosis not present

## 2021-11-13 DIAGNOSIS — E785 Hyperlipidemia, unspecified: Secondary | ICD-10-CM | POA: Diagnosis not present

## 2021-11-14 DIAGNOSIS — E785 Hyperlipidemia, unspecified: Secondary | ICD-10-CM | POA: Diagnosis not present

## 2021-11-14 DIAGNOSIS — I4811 Longstanding persistent atrial fibrillation: Secondary | ICD-10-CM | POA: Diagnosis not present

## 2021-11-14 DIAGNOSIS — R278 Other lack of coordination: Secondary | ICD-10-CM | POA: Diagnosis not present

## 2021-11-14 DIAGNOSIS — R262 Difficulty in walking, not elsewhere classified: Secondary | ICD-10-CM | POA: Diagnosis not present

## 2021-11-14 DIAGNOSIS — I619 Nontraumatic intracerebral hemorrhage, unspecified: Secondary | ICD-10-CM | POA: Diagnosis not present

## 2021-11-14 DIAGNOSIS — E1165 Type 2 diabetes mellitus with hyperglycemia: Secondary | ICD-10-CM | POA: Diagnosis not present

## 2021-11-14 DIAGNOSIS — R2681 Unsteadiness on feet: Secondary | ICD-10-CM | POA: Diagnosis not present

## 2021-11-14 DIAGNOSIS — M6281 Muscle weakness (generalized): Secondary | ICD-10-CM | POA: Diagnosis not present

## 2021-11-14 DIAGNOSIS — E1169 Type 2 diabetes mellitus with other specified complication: Secondary | ICD-10-CM | POA: Diagnosis not present

## 2021-11-17 DIAGNOSIS — I4811 Longstanding persistent atrial fibrillation: Secondary | ICD-10-CM | POA: Diagnosis not present

## 2021-11-17 DIAGNOSIS — E1169 Type 2 diabetes mellitus with other specified complication: Secondary | ICD-10-CM | POA: Diagnosis not present

## 2021-11-17 DIAGNOSIS — E785 Hyperlipidemia, unspecified: Secondary | ICD-10-CM | POA: Diagnosis not present

## 2021-11-17 DIAGNOSIS — R262 Difficulty in walking, not elsewhere classified: Secondary | ICD-10-CM | POA: Diagnosis not present

## 2021-11-17 DIAGNOSIS — M6281 Muscle weakness (generalized): Secondary | ICD-10-CM | POA: Diagnosis not present

## 2021-11-17 DIAGNOSIS — R278 Other lack of coordination: Secondary | ICD-10-CM | POA: Diagnosis not present

## 2021-11-17 DIAGNOSIS — E1165 Type 2 diabetes mellitus with hyperglycemia: Secondary | ICD-10-CM | POA: Diagnosis not present

## 2021-11-17 DIAGNOSIS — I619 Nontraumatic intracerebral hemorrhage, unspecified: Secondary | ICD-10-CM | POA: Diagnosis not present

## 2021-11-17 DIAGNOSIS — R2681 Unsteadiness on feet: Secondary | ICD-10-CM | POA: Diagnosis not present

## 2021-11-18 DIAGNOSIS — E1169 Type 2 diabetes mellitus with other specified complication: Secondary | ICD-10-CM | POA: Diagnosis not present

## 2021-11-18 DIAGNOSIS — E785 Hyperlipidemia, unspecified: Secondary | ICD-10-CM | POA: Diagnosis not present

## 2021-11-18 DIAGNOSIS — E1165 Type 2 diabetes mellitus with hyperglycemia: Secondary | ICD-10-CM | POA: Diagnosis not present

## 2021-11-18 DIAGNOSIS — R2681 Unsteadiness on feet: Secondary | ICD-10-CM | POA: Diagnosis not present

## 2021-11-18 DIAGNOSIS — I619 Nontraumatic intracerebral hemorrhage, unspecified: Secondary | ICD-10-CM | POA: Diagnosis not present

## 2021-11-18 DIAGNOSIS — R262 Difficulty in walking, not elsewhere classified: Secondary | ICD-10-CM | POA: Diagnosis not present

## 2021-11-18 DIAGNOSIS — M6281 Muscle weakness (generalized): Secondary | ICD-10-CM | POA: Diagnosis not present

## 2021-11-18 DIAGNOSIS — I4811 Longstanding persistent atrial fibrillation: Secondary | ICD-10-CM | POA: Diagnosis not present

## 2021-11-18 DIAGNOSIS — R278 Other lack of coordination: Secondary | ICD-10-CM | POA: Diagnosis not present

## 2021-11-19 DIAGNOSIS — M6281 Muscle weakness (generalized): Secondary | ICD-10-CM | POA: Diagnosis not present

## 2021-11-19 DIAGNOSIS — R2681 Unsteadiness on feet: Secondary | ICD-10-CM | POA: Diagnosis not present

## 2021-11-19 DIAGNOSIS — I619 Nontraumatic intracerebral hemorrhage, unspecified: Secondary | ICD-10-CM | POA: Diagnosis not present

## 2021-11-19 DIAGNOSIS — R262 Difficulty in walking, not elsewhere classified: Secondary | ICD-10-CM | POA: Diagnosis not present

## 2021-11-19 DIAGNOSIS — E1165 Type 2 diabetes mellitus with hyperglycemia: Secondary | ICD-10-CM | POA: Diagnosis not present

## 2021-11-19 DIAGNOSIS — I4811 Longstanding persistent atrial fibrillation: Secondary | ICD-10-CM | POA: Diagnosis not present

## 2021-11-19 DIAGNOSIS — E785 Hyperlipidemia, unspecified: Secondary | ICD-10-CM | POA: Diagnosis not present

## 2021-11-19 DIAGNOSIS — R278 Other lack of coordination: Secondary | ICD-10-CM | POA: Diagnosis not present

## 2021-11-19 DIAGNOSIS — E1169 Type 2 diabetes mellitus with other specified complication: Secondary | ICD-10-CM | POA: Diagnosis not present

## 2021-11-20 DIAGNOSIS — E785 Hyperlipidemia, unspecified: Secondary | ICD-10-CM | POA: Diagnosis not present

## 2021-11-20 DIAGNOSIS — M6281 Muscle weakness (generalized): Secondary | ICD-10-CM | POA: Diagnosis not present

## 2021-11-20 DIAGNOSIS — R2681 Unsteadiness on feet: Secondary | ICD-10-CM | POA: Diagnosis not present

## 2021-11-20 DIAGNOSIS — I619 Nontraumatic intracerebral hemorrhage, unspecified: Secondary | ICD-10-CM | POA: Diagnosis not present

## 2021-11-20 DIAGNOSIS — E1169 Type 2 diabetes mellitus with other specified complication: Secondary | ICD-10-CM | POA: Diagnosis not present

## 2021-11-20 DIAGNOSIS — R262 Difficulty in walking, not elsewhere classified: Secondary | ICD-10-CM | POA: Diagnosis not present

## 2021-11-20 DIAGNOSIS — E1165 Type 2 diabetes mellitus with hyperglycemia: Secondary | ICD-10-CM | POA: Diagnosis not present

## 2021-11-20 DIAGNOSIS — I4811 Longstanding persistent atrial fibrillation: Secondary | ICD-10-CM | POA: Diagnosis not present

## 2021-11-20 DIAGNOSIS — R278 Other lack of coordination: Secondary | ICD-10-CM | POA: Diagnosis not present

## 2021-11-21 DIAGNOSIS — E785 Hyperlipidemia, unspecified: Secondary | ICD-10-CM | POA: Diagnosis not present

## 2021-11-21 DIAGNOSIS — R262 Difficulty in walking, not elsewhere classified: Secondary | ICD-10-CM | POA: Diagnosis not present

## 2021-11-21 DIAGNOSIS — R278 Other lack of coordination: Secondary | ICD-10-CM | POA: Diagnosis not present

## 2021-11-21 DIAGNOSIS — E1165 Type 2 diabetes mellitus with hyperglycemia: Secondary | ICD-10-CM | POA: Diagnosis not present

## 2021-11-21 DIAGNOSIS — E1169 Type 2 diabetes mellitus with other specified complication: Secondary | ICD-10-CM | POA: Diagnosis not present

## 2021-11-21 DIAGNOSIS — I4811 Longstanding persistent atrial fibrillation: Secondary | ICD-10-CM | POA: Diagnosis not present

## 2021-11-21 DIAGNOSIS — M6281 Muscle weakness (generalized): Secondary | ICD-10-CM | POA: Diagnosis not present

## 2021-11-21 DIAGNOSIS — R2681 Unsteadiness on feet: Secondary | ICD-10-CM | POA: Diagnosis not present

## 2021-11-21 DIAGNOSIS — I619 Nontraumatic intracerebral hemorrhage, unspecified: Secondary | ICD-10-CM | POA: Diagnosis not present

## 2021-11-22 DIAGNOSIS — E785 Hyperlipidemia, unspecified: Secondary | ICD-10-CM | POA: Diagnosis not present

## 2021-11-22 DIAGNOSIS — E1165 Type 2 diabetes mellitus with hyperglycemia: Secondary | ICD-10-CM | POA: Diagnosis not present

## 2021-11-22 DIAGNOSIS — I4811 Longstanding persistent atrial fibrillation: Secondary | ICD-10-CM | POA: Diagnosis not present

## 2021-11-22 DIAGNOSIS — I619 Nontraumatic intracerebral hemorrhage, unspecified: Secondary | ICD-10-CM | POA: Diagnosis not present

## 2021-11-22 DIAGNOSIS — R262 Difficulty in walking, not elsewhere classified: Secondary | ICD-10-CM | POA: Diagnosis not present

## 2021-11-22 DIAGNOSIS — M6281 Muscle weakness (generalized): Secondary | ICD-10-CM | POA: Diagnosis not present

## 2021-11-22 DIAGNOSIS — R278 Other lack of coordination: Secondary | ICD-10-CM | POA: Diagnosis not present

## 2021-11-22 DIAGNOSIS — E1169 Type 2 diabetes mellitus with other specified complication: Secondary | ICD-10-CM | POA: Diagnosis not present

## 2021-11-22 DIAGNOSIS — R2681 Unsteadiness on feet: Secondary | ICD-10-CM | POA: Diagnosis not present

## 2022-01-22 ENCOUNTER — Ambulatory Visit: Payer: Medicare Other | Admitting: Emergency Medicine

## 2022-02-11 ENCOUNTER — Ambulatory Visit (INDEPENDENT_AMBULATORY_CARE_PROVIDER_SITE_OTHER): Payer: Medicare Other | Admitting: Cardiovascular Disease

## 2022-02-11 ENCOUNTER — Encounter: Payer: Self-pay | Admitting: Cardiovascular Disease

## 2022-02-11 DIAGNOSIS — G4733 Obstructive sleep apnea (adult) (pediatric): Secondary | ICD-10-CM | POA: Diagnosis not present

## 2022-02-11 DIAGNOSIS — I480 Paroxysmal atrial fibrillation: Secondary | ICD-10-CM | POA: Diagnosis not present

## 2022-02-11 DIAGNOSIS — E785 Hyperlipidemia, unspecified: Secondary | ICD-10-CM

## 2022-02-11 DIAGNOSIS — I10 Essential (primary) hypertension: Secondary | ICD-10-CM | POA: Diagnosis not present

## 2022-02-11 DIAGNOSIS — Z7901 Long term (current) use of anticoagulants: Secondary | ICD-10-CM

## 2022-02-11 DIAGNOSIS — I452 Bifascicular block: Secondary | ICD-10-CM

## 2022-02-11 DIAGNOSIS — Z8673 Personal history of transient ischemic attack (TIA), and cerebral infarction without residual deficits: Secondary | ICD-10-CM

## 2022-02-11 DIAGNOSIS — E1169 Type 2 diabetes mellitus with other specified complication: Secondary | ICD-10-CM

## 2022-02-11 DIAGNOSIS — E669 Obesity, unspecified: Secondary | ICD-10-CM

## 2022-02-11 NOTE — Progress Notes (Signed)
Cardiology Office Note    Date:  02/18/2022   ID:  Darius Ingram, Darius Ingram 02-05-1956, MRN [id number]  PCP:  Horald Pollen, MD  Cardiologist:  Shelva Majestic, MD (sleep); Dr. Sallyanne Kuster  Sleep consultation referred through the courtesy of Dr. Sallyanne Kuster following his prior sleep study   History of Present Illness:  Darius Ingram is a 66 y.o. male is a 66 year old gentleman who is currently residing at [facility] following suffering a stroke.  He is followed by Dr. Sallyanne Kuster and last saw him in November 2022.  He has a history of morbid obesity, hypertension, type 2 diabetes mellitus, hyperlipidemia, left ventricular dysfunction with an echo in April 2022 showing an EF of 45 to 50%.  He was diagnosed as having atrial fibrillation in March 2022 during a routine evaluation [facility].  He was spontaneously rate controlled with ventricular rate in the 80s.  On January 09, 2021 he underwent successful cardioversion and has been maintaining sinus rhythm and was anticoagulated with Eliquis.  Due to concerns for obstructive sleep apnea, he was referred for a sleep study on Dec 20, 2020.  He was found to have severe obstructive sleep apnea with an AHI of 53.6/h as well as mild central apnea with a CT AHI of 5.6/h.He was found to have severe obstructive sleep apnea with an AHI of 53.6/h as well as mild central apnea with a CAI of 5.6/h.  Expeditious CPAP titration was recommended and it was felt that the patient may require BiPAP or ASV titration to illuminate central apnea if necessary.  When he saw Dr. Sallyanne Kuster since he had not yet had his CPAP titration study he was scheduled to undergo this.  The patient never underwent the titration study and was not initiated on AutoPap therapy.  He was scheduled for an initial sleep evaluation with me in December, 2022 but had to cancel that appointment.  He was rescheduled in January, 2023 but unfortunately he developed a stroke in late  December when he presented to [facility] emergency room after a fall on July 25, 2022 with right-sided weakness, slurred speech, right facial weakness.  Head CT showed 1.8 cm acute intraparenchymal hematoma at the left internal capsule.  MRI showed possible increase in the hematoma and small acute infarct in the left frontal lobe.  He was given Andexxa to reverse Eliquis and type coagulation was admitted to the neuro ICU with nicardipine drip and ultimately was discharged on July 31, 2021.  Subsequently, he has been at residing at [facility].   Presently, he is now on amlodipine 10 mg daily, lisinopril 20 mg and metoprolol tartrate 25 mg twice a day for hypertension and for PAF.  He is diabetic on insulin, metformin and Trulicity.  He is on atorvastatin 40 mg for hyperlipidemia and pantoprazole for GERD.  He has had issues with continued sleep difficulty.  He snores.  He admits to frequent awakenings.  Sleep is nonrestorative.  He never had his subsequent sleep evaluations.  An Epworth Sleepiness Scale score was calculated in the office today and this endorsed at 8 as shown below:    Epworth Sleepiness Scale: Situation   Chance of Dozing/Sleeping (0 = never , 1 = slight chance , 2 = moderate chance , 3 = high chance )   sitting and reading 1   watching TV 2   sitting inactive in a public place 2   being a passenger in a motor vehicle for an hour or more 1  lying down in the afternoon 1   sitting and talking to someone 0   sitting quietly after lunch (no alcohol) 1   while stopped for a few minutes in traffic as the driver 0   Total Score  8    He is unaware of any bruxism, painful restless legs, hip no gag neck no hypnopompic hallucinations or cataplectic events.  He presents today for his initial sleep evaluation.   Past Medical History:  Diagnosis Date   Allergy    seasonal allergies   Atrial fibrillation (HCC)    Diabetes mellitus without complication ([place])    Hyperlipidemia     Hypertension    Hypothyroidism    Joint pain    SOBOE (shortness of breath on exertion)     Past Surgical History:  Procedure Laterality Date   CARDIOVERSION N/A 01/09/2021   Procedure: CARDIOVERSION;  Surgeon: [name], MD;  Location: [facility];  Service: Cardiovascular;  Laterality: N/A;   TONSILECTOMY, ADENOIDECTOMY, BILATERAL MYRINGOTOMY [place]    Current Medications: Outpatient Medications Prior to Visit  Medication Sig Dispense Refill   acetaminophen (TYLENOL) 325 MG tablet Take 2 tablets (650 mg total) by mouth every 4 (four) hours as needed for mild pain (or temp > 37.5 C (99.5 F)).     acetaminophen (TYLENOL) 650 MG CR tablet Take 650 mg by mouth daily.     amLODipine (NORVASC) 10 MG tablet Take 1 tablet (10 mg total) by mouth daily. 90 tablet 3   atorvastatin (LIPITOR) 40 MG tablet Take 1 tablet (40 mg total) by mouth daily. (Patient taking differently: Take 40 mg by mouth daily.) 90 tablet 3   calcium carbonate (TUMS EX) 750 MG chewable tablet Chew 750-1,500 mg by mouth daily as needed for heartburn.     cetirizine (ZYRTEC) 10 MG tablet Take 1 tablet (10 mg total) by mouth daily. 90 tablet 3   Dabigatran Etexilate Mesylate (PRADAXA) 110 MG CAPS Take 110 mg by mouth 2 (two) times daily.     Dulaglutide (TRULICITY) 1.5 XM/4.6OE SOPN Inject 1.5 mg into the skin once a week. 6 mL 5   ELIQUIS 5 MG TABS tablet Take 5 mg by mouth 2 (two) times daily.     fluticasone (FLONASE) 50 MCG/ACT nasal spray Place 2 sprays into both nostrils daily. (Patient taking differently: Place 2 sprays into both nostrils daily as needed for allergies.) 16 g 3   insulin aspart (NOVOLOG) 100 UNIT/ML injection Inject 0-20 Units into the skin 3 (three) times daily with meals. 10 mL 11   lisinopril (ZESTRIL) 20 MG tablet Take 1 tablet (20 mg total) by mouth daily. (Patient taking differently: Take 20 mg by mouth daily.) 90 tablet 3    metFORMIN (GLUCOPHAGE) 1000 MG tablet Take 1 tablet (1,000 mg total) by mouth 2 (two) times daily with a meal. 180 tablet 3   metoprolol tartrate (LOPRESSOR) 25 MG tablet Take 1 tablet (25 mg total) by mouth 2 (two) times daily.     Multiple Vitamin (MULTIVITAMIN PO) Take 1 tablet by mouth daily.     pantoprazole (PROTONIX) 40 MG tablet Take 1 tablet (40 mg total) by mouth at bedtime.     senna-docusate (SENOKOT-S) 8.6-50 MG tablet Take 1 tablet by mouth 2 (two) times daily.     No facility-administered medications prior to visit.     Allergies:   Penicillins   Social History  Socioeconomic History   Marital status: Single    Spouse name: Not on file   Number of children: Not on file   Years of education: Not on file   Highest education level: Not on file  Occupational History   Occupation: Retired Biomedical scientist  Tobacco Use   Smoking status: Former    Packs/day: 1.00    Years: 10.00    Total pack years: 10.00    Types: Cigarettes    Quit date: 12/29/1998    Years since quitting: 23.1   Smokeless tobacco: Never  Vaping Use   Vaping Use: Never used  Substance and Sexual Activity   Alcohol use: Yes    Alcohol/week: 1.0 standard drink of alcohol    Types: 1 Cans of beer per week    Comment: occ   Drug use: No   Sexual activity: Never  Other Topics Concern   Not on file  Social History Narrative   Not on file   Social Determinants of Health   Financial Resource Strain: Not on file  Food Insecurity: Not on file  Transportation Needs: Not on file  Physical Activity: Not on file  Stress: Not on file  Social Connections: Not on file    Social history is notable that he was born in [place].  He is single.  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  Family History:  The patient's which was done on Dec 20, 2020.  family history includes Diabetes in his father and mother; Heart disease in his father; Hypertension in his father; Obesity in  his father and mother.   ROS General: Negative; No fevers, chills, or night sweats;  HEENT: Negative; No changes in vision or hearing, sinus congestion, difficulty swallowing Pulmonary: Negative; No cough, wheezing, shortness of breath, hemoptysis Cardiovascular: See HPI, GI:  GU: Negative; No dysuria, hematuria, or difficulty voiding Musculoskeletal: Negative; no myalgias, joint pain, or weakness Hematologic/Oncology: Negative; no easy bruising, bleeding Endocrine: Negative; no heat/cold intolerance; no diabetes Neuro: CVA July 25, 2021 Skin: Negative; No rashes or skin lesions Psychiatric: Negative; No behavioral problems, depression Sleep: See HPI  other comprehensive 14 point system review is negative.   PHYSICAL EXAM:   VS:  BP 126/80   Pulse 95   Ht 6' (1.829 m)   Wt 298 lb 9.6 oz (135.4 kg)   SpO2 95%   BMI 40.50 kg/m     Repeat blood pressure by me was 126/80  Wt Readings from Last 3 Encounters:  02/11/22 298 lb 9.6 oz (135.4 kg)  11/08/21 (!) 309 lb (140.2 kg)  07/25/21 (!) 330 lb (149.7 kg)    General: Alert, oriented, no distress.  Morbid obesity Skin: normal turgor, no rashes, warm and dry HEENT: Normocephalic, atraumatic. Pupils equal round and reactive to light; sclera anicteric; extraocular muscles intact;  Nose without nasal septal hypertrophy Mouth/Parynx benign; Mallinpatti scale 4 Neck: No JVD, no carotid bruits; normal carotid upstroke Lungs: clear to ausculatation and percussion; no wheezing or rales Chest wall: without tenderness to palpitation Heart: PMI not displaced, RRR, s1 s2 normal, 1/6 systolic murmur, no diastolic murmur, no rubs, gallops, thrills, or heaves Abdomen: soft, nontender; no hepatosplenomehaly, BS+; abdominal aorta nontender and not dilated by palpation. Back: no CVA tenderness Pulses 2+ Musculoskeletal: full range of motion, normal strength, no joint deformities Extremities: 1+ bilateral lower extremity edema no clubbing,  cyanosis, Homan's sign negative  Neurologic: Residual right-sided weakness of leg and arm since suffering a  stroke Psychologic: Normal mood and affect   Studies/Labs Reviewed:   ECG (independently read by me): Sinus rhythm with PVC, RBBB, LAHB  Recent Labs:    Latest Ref Rng & Units 07/30/2021    2:42 AM 07/29/2021    2:33 AM 07/28/2021    1:35 AM  BMP  Glucose 70 - 99 mg/dL 148  145  140   BUN 8 - 23 mg/dL _0 Creatinine 0.61 - 1.24 mg/dL 0.96  0.93  1.15   Sodium 135 - 145 mmol/L 138  141  140   Potassium 3.5 - 5.1 mmol/L 3.6  3.5  3.4   Chloride 98 - 111 mmol/L 105  105  106   CO2 22 - 32 mmol/L _1 Calcium 8.9 - 10.3 mg/dL 8.3  8.4  8.5         Latest Ref Rng & Units 07/25/2021    9:09 AM 04/03/2020   10:20 AM 12/29/2019    2:54 PM  Hepatic Function  Total Protein 6.5 - 8.1 g/dL 6.8  7.1  6.9   Albumin 3.5 - 5.0 g/dL 3.7  4.3  4.1   AST 15 - 41 U/L _2 ALT 0 - 44 U/L 23  16  32   Alk Phosphatase 38 - 126 U/L 76  85  95   Total Bilirubin 0.3 - 1.2 mg/dL 0.7  0.5  0.4        Latest Ref Rng & Units 07/30/2021    2:42 AM 07/29/2021    2:33 AM 07/28/2021    1:35 AM  CBC  WBC 4.0 - 10.5 K/uL 13.8  14.5  10.5   Hemoglobin 13.0 - 17.0 g/dL 13.9  13.9  13.4   Hematocrit 39.0 - 52.0 % 41.9  41.3  40.9   Platelets 150 - 400 K/uL 230  234  229    Lab Results  Component Value Date   MCV 89.1 07/30/2021   MCV 87.9 07/29/2021   MCV 88.9 07/28/2021   No results found for: "TSH" Lab Results  Component Value Date   HGBA1C 6.3 (H) 07/25/2021     BNP    Component Value Date/Time   BNP 61.8 04/20/2018 1036    ProBNP No results found for: "PROBNP"   Lipid Panel     Component Value Date/Time   CHOL 123 07/25/2021 1030   CHOL 152 10/02/2020 1039   TRIG 96 07/25/2021 1030   HDL 29 (L) 07/25/2021 1030   HDL 37 (L) 10/02/2020 1039   CHOLHDL 4.2 07/25/2021 1030   VLDL 19 07/25/2021 1030   LDLCALC 75 07/25/2021 1030   LDLCALC 93 10/02/2020 1039    LABVLDL 22 10/02/2020 1039     RADIOLOGY: No results found.   Additional studies/ records that were reviewed today include:     Patient Name: Darius Ingram, Darius Ingram Date: 12/20/2020 Gender: Male D.O.B: [birth year] Age (years): 65 Referring Provider: Malka So PA Height (inches): 70 Interpreting Physician: Shelva Majestic MD, ABSM Weight (lbs): 336 RPSGT: Baxter Flattery BMI: 48 MRN: [id number] Neck Size: 22.50   CLINICAL INFORMATION Sleep Study Type: NPSG   Indication for sleep study: Congestive Heart Failure, Fatigue, Hypertension, Obesity, Snoring, Witnesses Apnea / Gasping During Sleep     Epworth Sleepiness Score: 8   SLEEP STUDY TECHNIQUE As per the AASM Manual for the Scoring of Sleep and Associated Events v2.3 (April 2016) with a hypopnea  requiring 4% desaturations.   The channels recorded and monitored were frontal, central and occipital EEG, electrooculogram (EOG), submentalis EMG (chin), nasal and oral airflow, thoracic and abdominal wall motion, anterior tibialis EMG, snore microphone, electrocardiogram, and pulse oximetry.   MEDICATIONS acetaminophen (TYLENOL) 325 MG tablet amLODipine (NORVASC) 10 MG tablet apixaban (ELIQUIS) 5 MG TABS tablet atorvastatin (LIPITOR) 40 MG tablet calcium carbonate (TUMS EX) 750 MG chewable tablet cetirizine (ZYRTEC) 10 MG tablet Dulaglutide (TRULICITY) 4.69 GE/9.5MW SOPN fluticasone (FLONASE) 50 MCG/ACT nasal spray lisinopril (ZESTRIL) 20 MG tablet metFORMIN (GLUCOPHAGE) 1000 MG tablet (Expired) Multiple Vitamin (MULTIVITAMIN PO) Medications self-administered by patient taken the night of the study : N/A   SLEEP ARCHITECTURE The study was initiated at 10:11:55 PM and ended at 4:41:49 AM.   Sleep onset time was 65.3 minutes and the sleep efficiency was 38.8%%. The total sleep time was 151.1 minutes.   Stage REM latency was 179.0 minutes.   The patient spent 2.7%% of the night in stage N1 sleep, 61.5%% in stage N2  sleep, 0.0%% in stage N3 and 35.7% in REM.   Alpha intrusion was absent.   Supine sleep was 64.27%.   RESPIRATORY PARAMETERS The overall apnea/hypopnea index (AHI) was 53.6 per hour. There were 130 total apneas, including 38 obstructive, 14 central and 78 mixed apneas. There were 5 hypopneas and 0 RERAs.   The AHI during Stage REM sleep was 48.9 per hour.   AHI while supine was 53.7 per hour.   The mean oxygen saturation was 83.6%. The minimum SpO2 during sleep was 52.0%.   Loud snoring was noted during this study.   CARDIAC DATA The 2 lead EKG demonstrated sinus rhythm. The mean heart rate was 79.8 beats per minute. Other EKG findings include: PVCs.   LEG MOVEMENT DATA The total PLMS were 0 with a resulting PLMS index of 0.0. Associated arousal with leg movement index was 0.0 .   IMPRESSIONS - Severe obstructive sleep apnea occurred during this study (AHI 53.6/h). - Mild central sleep apnea occurred during this study (CAI 5.6/h). - Severe oxygen desaturation to a nadir of 52.0%. - The patient snored with loud snoring volume. - EKG findings include atrial fibrillation, PVCs. - Clinically significant periodic limb movements did not occur during sleep. No significant associated arousals.   DIAGNOSIS - Obstructive Sleep Apnea (G47.33) - Central Sleep Apnea (G47.37) - Nocturnal Hypoxemia (G47.36)   RECOMMENDATIONS - Expeditious CPAP titration to determine optimal pressure required to alleviate sleep disordered breathing. BiPAP or ASV titration may be required to eliminate central sleep apnea. - Effort should be made to optimize nasal and oropharyngeal patency. - Avoid alcohol, sedatives and other CNS depressants that may worsen sleep apnea and disrupt normal sleep architecture. - Sleep hygiene should be reviewed to assess factors that may improve sleep quality. - Weight management (BMI 48) and regular exercise should be initiated or continued if appropriate.   ASSESSMENT:     1. OSA (obstructive sleep apnea)   2. H/O: CVA (cerebrovascular accident)   3. Essential hypertension   4. PAF (paroxysmal atrial fibrillation) ([place])   5. Bifascicular block   6. Diabetes mellitus type 2 in obese ([place])   7. Hyperlipidemia LDL goal <70   8. Morbid obesity ([place])   9. Long term (current) use of anticoagulants     PLAN:  [name] is a 66 year old gentleman who has significant cardiovascular comorbidities including hypertension, hyperlipidemia, diabetes mellitus, AV conduction abnormalities with bifascicular block, and had developed atrial fibrillation.  He ultimately underwent successful cardioversion.  Due to significant concerns for obstructive sleep apnea playing a role in his atrial fibrillation he underwent an in lab sleep study on Dec 20, 2020 and was found to have severe sleep apnea with an AHI of 53.6/h.  He had severe oxygen desaturation to a nadir of 52% and had loud snoring throughout the study.  Unfortunately, prior to undergoing titration study or an evaluation with me he suffered a stroke requiring hospitalization and subsequent post stroke rehabilitation at [facility] where he still is currently residing.  He has residual right sided weakness involving his leg and arm since his stroke.  His blood pressure today is stable on his regimen of amlodipine 10 mg lisinopril 20 mg metoprolol 25 mg twice a day and he is back on Eliquis following his stroke.  From a sleep perspective, he continues to have suboptimal sleep.  He has a significant history of snoring, fatigability, and previously had had witnessed apnea and was noted to gasp for breath awakening from sleep.  I spent over 40 minutes with him in the office today.  I reviewed his diagnostic polysomnogram with him in detail.  I discussed the potential adverse consequences of untreated sleep apnea on his cardiovascular health.  I specifically discussed its effects on blood pressure control, potential for  nocturnal arrhythmias, significant increased incidence of potential atrial fibrillation develop meant and if the patient is cardioverted or undergoes ablation there is still increased risk for recurrence of atrial fibrillation if sleep apnea is untreated.  I also discussed its effects on insulin resistance, increased inflammatory markers, GERD, as well as the potential for significant nocturnal hypoxemia contributing to nocturnal ischemia both cardiac as well as cerebrovascular.  I also discussed its implications regarding increased nocturia.  I discussed optimal sleep duration at 7 and 9 hours in an adult.  His sleep study was 14 months ago.  Due to this duration, he may require another evaluation prior to treatment.  However otherwise I would recommend scheduling him for an in lab CPAP with potential need for BiPAP titration if he continues to have his previously noted central apneic events.  I will contact our sleep coordinator and she will check with insurance to see if he can be directly scheduled for the CPAP/BiPAP titration evaluation.  He is feeling improved following his stroke with rehabilitation.  He continues to have right-sided weakness.  He has lost significant weight since his initial CPAP evaluation when his weight was 336 and BMI 48.  Present weight is 298 with BMI 40.1 still consistent with morbid obesity.  I will see him in follow-up following initiation of CPAP or BiPAP therapy.   Medication Adjustments/Labs and Tests Ordered: Current medicines are reviewed at length with the patient today.  Concerns regarding medicines are outlined above.  Medication changes, Labs and Tests ordered today are listed in the Patient Instructions below. Patient Instructions  Medication Instructions:  Your physician recommends that you continue on your current medications as directed. Please refer to the Current Medication list given to you today.  *If you need a refill on your cardiac medications before  your next appointment, please call your pharmacy*   Follow-Up: At [facility], you and your health needs are our priority.  As part of our continuing mission to provide you with exceptional heart care, we have created designated Provider Care Teams.  These Care Teams include your primary Cardiologist (physician) and Advanced Practice Providers (APPs -  Physician Assistants and Nurse Practitioners)  who all work together to provide you with the care you need, when you need it.  We recommend signing up for the patient portal called "MyChart".  Sign up information is provided on this After Visit Summary.  MyChart is used to connect with patients for Virtual Visits (Telemedicine).  Patients are able to view lab/test results, encounter notes, upcoming appointments, etc.  Non-urgent messages can be sent to your provider as well.   To learn more about what you can do with MyChart, go to [email or web address].    Your next appointment:   To be determined  Provider:   Shelva Majestic, MD  Other Instructions Sleep Clinic- we will reach out to you about CPAP/BiPAP titration.   Signed, Shelva Majestic, MD, MD, [place], American Board of Sleep Medicine   02/18/2022 8:39 AM    [facility] [address], [place], [place], [place]  [address] Phone: [phone]

## 2022-02-11 NOTE — Patient Instructions (Signed)
Medication Instructions:  Your physician recommends that you continue on your current medications as directed. Please refer to the Current Medication list given to you today.  *If you need a refill on your cardiac medications before your next appointment, please call your pharmacy*   Follow-Up: At [facility], you and your health needs are our priority.  As part of our continuing mission to provide you with exceptional heart care, we have created designated Provider Care Teams.  These Care Teams include your primary Cardiologist (physician) and Advanced Practice Providers (APPs -  Physician Assistants and Nurse Practitioners) who all work together to provide you with the care you need, when you need it.  We recommend signing up for the patient portal called "MyChart".  Sign up information is provided on this After Visit Summary.  MyChart is used to connect with patients for Virtual Visits (Telemedicine).  Patients are able to view lab/test results, encounter notes, upcoming appointments, etc.  Non-urgent messages can be sent to your provider as well.   To learn more about what you can do with MyChart, go to [email or web address].    Your next appointment:   To be determined  Provider:   Shelva Majestic, MD  Other Instructions Sleep Clinic- we will reach out to you about CPAP/BiPAP titration.

## 2022-02-18 ENCOUNTER — Encounter: Payer: Self-pay | Admitting: Cardiovascular Disease

## 2022-02-20 ENCOUNTER — Ambulatory Visit (INDEPENDENT_AMBULATORY_CARE_PROVIDER_SITE_OTHER): Payer: Medicare Other | Admitting: Cardiovascular Disease

## 2022-02-20 ENCOUNTER — Encounter: Payer: Self-pay | Admitting: Cardiovascular Disease

## 2022-02-20 VITALS — BP 102/70 | HR 105 | Ht 72.0 in | Wt 298.4 lb

## 2022-02-20 DIAGNOSIS — D6869 Other thrombophilia: Secondary | ICD-10-CM | POA: Diagnosis not present

## 2022-02-20 DIAGNOSIS — Z8679 Personal history of other diseases of the circulatory system: Secondary | ICD-10-CM | POA: Diagnosis not present

## 2022-02-20 DIAGNOSIS — I10 Essential (primary) hypertension: Secondary | ICD-10-CM

## 2022-02-20 DIAGNOSIS — I452 Bifascicular block: Secondary | ICD-10-CM

## 2022-02-20 DIAGNOSIS — E785 Hyperlipidemia, unspecified: Secondary | ICD-10-CM

## 2022-02-20 DIAGNOSIS — I4819 Other persistent atrial fibrillation: Secondary | ICD-10-CM

## 2022-02-20 DIAGNOSIS — E669 Obesity, unspecified: Secondary | ICD-10-CM

## 2022-02-20 DIAGNOSIS — E1169 Type 2 diabetes mellitus with other specified complication: Secondary | ICD-10-CM

## 2022-02-20 DIAGNOSIS — G4733 Obstructive sleep apnea (adult) (pediatric): Secondary | ICD-10-CM

## 2022-02-20 NOTE — Progress Notes (Signed)
Cardiology Office Note:    Date:  02/23/2022   ID:  Rj Pedrosa, DOB [birth year], MRN [id number]  PCP:  Horald Pollen, MD   [place] [facility] Cardiologist:  Sanda Klein, MD     Referring MD: Horald Pollen, *   No chief complaint on file.    History of Present Illness:    Darius Ingram is a 66 y.o. male with a hx of severe OSA, morbid obesity, hypertension, type 2 diabetes mellitus, hypercholesterolemia, mild left ventricular systolic dysfunction (Echo 11/14/2020 EF 45-50%), incidentally diagnosed with persistent atrial fibrillation with spontaneously controlled ventricular rate in  March 2022, status post successful cardioversion May 2022, status post intracranial hemorrhage (intraparenchymal hematoma left basal ganglia associated with small acute left frontal infarcts, unclear if hemorrhagic transformation of ischemic stroke).  He has been a resident at [facility] ever since the stroke.  He is making progress and is now walking with a walker.  Right arm strength has improved, now 4/5.  His speech is good, almost back to normal and he does not have any swallowing difficulties.  He was seen in the sleep clinic last week but is still not on CPAP.  He has mild edema in the lower extremities, always a little worse on the right side.  He does not have orthopnea or PND.  He remains unaware of the arrhythmia.  He has not had dizziness, palpitations or syncope.  He is back on Eliquis and has not had new bleeding problems, falls or serious injuries.  He is hoping to make enough progress with physical therapy so that he will be able to go to some type of a group home setting.  After his episode of intracranial hemorrhage she was deemed to be an Eliquis "failure "so he was switched to Pradaxa.  At some point he has been placed back on Eliquis.  He has lost 60 pounds from his peak of 360 pounds in 2021 but remains morbidly obese with a BMI just over 40.  He is  very well controlled.  Most recent hemoglobin A1c was 6.3% and most recent LDL cholesterol was 75, but his HDL is chronically low at 29.   Past Medical History:  Diagnosis Date   Allergy    seasonal allergies   Atrial fibrillation (HCC)    Diabetes mellitus without complication ([place])    Hyperlipidemia    Hypertension    Hypothyroidism    Joint pain    SOBOE (shortness of breath on exertion)     Past Surgical History:  Procedure Laterality Date   CARDIOVERSION N/A 01/09/2021   Procedure: CARDIOVERSION;  Surgeon: [name], MD;  Location: [facility] ENDOSCOPY;  Service: Cardiovascular;  Laterality: N/A;   TONSILECTOMY, ADENOIDECTOMY, BILATERAL MYRINGOTOMY [place]    Current Medications: Current Meds  Medication Sig   acetaminophen (TYLENOL) 650 MG CR tablet Take 325 mg by mouth daily.   amLODipine (NORVASC) 10 MG tablet Take 1 tablet (10 mg total) by mouth daily.   atorvastatin (LIPITOR) 40 MG tablet Take 1 tablet (40 mg total) by mouth daily. (Patient taking differently: Take 40 mg by mouth daily.)   cetirizine (ZYRTEC) 10 MG tablet Take 1 tablet (10 mg total) by mouth daily.   Dulaglutide (TRULICITY) 1.5 RD/4.0CX SOPN Inject 1.5 mg into the skin once a week.   ELIQUIS 5 MG TABS tablet Take 5 mg by mouth 2 (two) times daily.   lidocaine (  LIDODERM) 5 % Place 1 patch onto the skin daily in the afternoon. On the am and off in the pm   lisinopril (ZESTRIL) 20 MG tablet Take 1 tablet (20 mg total) by mouth daily. (Patient taking differently: Take 20 mg by mouth daily.)   metFORMIN (GLUCOPHAGE) 1000 MG tablet Take 1 tablet (1,000 mg total) by mouth 2 (two) times daily with a meal.   metoprolol tartrate (LOPRESSOR) 25 MG tablet Take 1 tablet (25 mg total) by mouth 2 (two) times daily.   Multiple Vitamin (MULTIVITAMIN PO) Take 1 tablet by mouth daily.   pantoprazole (PROTONIX) 40 MG tablet Take 1 tablet (40 mg total) by mouth  at bedtime.   senna-docusate (SENOKOT-S) 8.6-50 MG tablet Take 1 tablet by mouth 2 (two) times daily.   [DISCONTINUED] Dabigatran Etexilate Mesylate (PRADAXA) 110 MG CAPS Take 110 mg by mouth 2 (two) times daily.   [DISCONTINUED] insulin aspart (NOVOLOG) 100 UNIT/ML injection Inject 0-20 Units into the skin 3 (three) times daily with meals.     Allergies:   Penicillins   Social History   Socioeconomic History   Marital status: Single    Spouse name: Not on file   Number of children: Not on file   Years of education: Not on file   Highest education level: Not on file  Occupational History   Occupation: Retired Biomedical scientist  Tobacco Use   Smoking status: Former    Packs/day: 1.00    Years: 10.00    Total pack years: 10.00    Types: Cigarettes    Quit date: 12/29/1998    Years since quitting: 23.1   Smokeless tobacco: Never  Vaping Use   Vaping Use: Never used  Substance and Sexual Activity   Alcohol use: Yes    Alcohol/week: 1.0 standard drink of alcohol    Types: 1 Cans of beer per week    Comment: occ   Drug use: No   Sexual activity: Never  Other Topics Concern   Not on file  Social History Narrative   Not on file   Social Determinants of Health   Financial Resource Strain: Not on file  Food Insecurity: Not on file  Transportation Needs: Not on file  Physical Activity: Not on file  Stress: Not on file  Social Connections: Not on file     Family History: The patient's family history includes Diabetes in his father and mother; Heart disease in his father; Hypertension in his father; Obesity in his father and mother. There is no history of Colon polyps, Colon cancer, Esophageal cancer, Stomach cancer, or Rectal cancer.  ROS:   Please see the history of present illness.     All other systems reviewed and are negative.  EKGs/Labs/Other Studies Reviewed:    The following studies were reviewed today: Echocardiogram 11/14/2020  1. Abnormal septal motion inferior basal  hypokinesis Abnormal GLS -13.3 . Left ventricular ejection fraction, by estimation, is 45 to 50%. The left ventricle has mildly decreased function. The left ventricle has no regional wall motion abnormalities. There is severe left ventricular hypertrophy. Left ventricular diastolic  parameters are indeterminate.   2. Right ventricular systolic function is normal. The right ventricular size is normal.   3. Left atrial size was moderately dilated.   4. The mitral valve is normal in structure. Trivial mitral valve  regurgitation. No evidence of mitral stenosis.   5. The aortic valve is normal in structure. Aortic valve regurgitation is not visualized. No aortic stenosis is present.  6. Aortic dilatation noted. There is mild dilatation of the ascending  aorta, measuring 38 mm.   7. The inferior vena cava is normal in size with greater than 50%  respiratory variability, suggesting right atrial pressure of 3 mmHg.    EKG:  EKG is ordered today and personally reviewed.  It shows atypical atrial flutter with probably 2:1 AV block and occasional PVC, RBBB+LAFB   Echo 07/25/2021   1. Basal to mid inferior hypokinesis. Left ventricular ejection fraction, by estimation, is 55 to 60%. The left ventricle has normal function. The left ventricle demonstrates regional wall motion abnormalities (see scoring diagram/findings for  description). There is moderate concentric left ventricular hypertrophy. Left ventricular diastolic parameters are consistent with Grade I diastolic dysfunction (impaired relaxation).   2. Right ventricular systolic function is normal. The right ventricular size is normal. There is normal pulmonary artery systolic pressure.   3. Left atrial size was mildly dilated.   4. The mitral valve is normal in structure. No evidence of mitral valve regurgitation. No evidence of mitral stenosis.   5. The aortic valve is tricuspid. Aortic valve regurgitation is not visualized. No aortic stenosis is  present.   6. Aortic dilatation noted. There is mild dilatation of the ascending aorta, measuring 40 mm.   7. The inferior vena cava is normal in size with greater than 50% respiratory variability, suggesting right atrial pressure of 3 mmHg.   Recent Labs: 07/25/2021: ALT 23 07/27/2021: Magnesium 1.8 07/30/2021: BUN 22; Creatinine, Ser 0.96; Hemoglobin 13.9; Platelets 230; Potassium 3.6; Sodium 138  Recent Lipid Panel    Component Value Date/Time   CHOL 123 07/25/2021 1030   CHOL 152 10/02/2020 1039   TRIG 96 07/25/2021 1030   HDL 29 (L) 07/25/2021 1030   HDL 37 (L) 10/02/2020 1039   CHOLHDL 4.2 07/25/2021 1030   VLDL 19 07/25/2021 1030   [place] 75 07/25/2021 1030   LDLCALC 93 10/02/2020 1039     Risk Assessment/Calculations:    CHA2DS2-VASc Score =  6 (age, HTN, DM, CHF, CVA)  Physical Exam:    VS:  BP 102/70 (BP Location: Left Arm, Patient Position: Sitting, Cuff Size: Large)   Pulse (!) 105   Ht 6' (1.829 m)   Wt 298 lb 6.4 oz (135.4 kg)   SpO2 96%   BMI 40.47 kg/m     Wt Readings from Last 3 Encounters:  02/20/22 298 lb 6.4 oz (135.4 kg)  02/11/22 298 lb 9.6 oz (135.4 kg)  11/08/21 (!) 309 lb (140.2 kg)     General: Alert, oriented x3, no distress, morbidly obese Head: no evidence of trauma, PERRL, EOMI, no exophtalmos or lid lag, no myxedema, no xanthelasma; normal ears, nose and oropharynx Neck: normal jugular venous pulsations and no hepatojugular reflux; brisk carotid pulses without delay and no carotid bruits Chest: clear to auscultation, no signs of consolidation by percussion or palpation, normal fremitus, symmetrical and full respiratory excursions Cardiovascular: normal position and quality of the apical impulse, irregular rhythm, normal first and widely split second heart sounds, no murmurs, rubs or gallops Abdomen: no tenderness or distention, no masses by palpation, no abnormal pulsatility or arterial bruits, normal bowel sounds, no  hepatosplenomegaly Extremities: no clubbing, cyanosis or edema; 2+ radial, ulnar and brachial pulses bilaterally; 2+ right femoral, posterior tibial and dorsalis pedis pulses; 2+ left femoral, posterior tibial and dorsalis pedis pulses; no subclavian or femoral bruits Neurological: mild residual R hemiplegia Psych: Normal mood and affect  ASSESSMENT:  1. Persistent atrial fibrillation ([place])   2. Acquired thrombophilia ([place])   3. History of intracranial hemorrhage   4. Bifascicular block   5. OSA (obstructive sleep apnea)   6. Morbid obesity ([place])   7. Essential hypertension   8. Dyslipidemia (high LDL; low HDL)   9. Diabetes mellitus type 2 in obese [facility])      PLAN:    In order of problems listed above:  AFib/flutter: He maintained sinus rhythm for roughly 5 months following his cardioversion, despite not yet being on treatment for his sleep apnea. However, I do not think additional attempts at cardioversion make sense until he is using CPAP.  He is compliant with anticoagulation.  Congratulated him on his weight loss efforts. Anticoagulation: intraparenchymal hemorrhage on DOAC amy have been due to hemorrhagic transformation of ischemic stroke. Back on same DOAC. Bifascicular block: Significant AV conduction abnormalities are also confirmed by the fact that he had spontaneous ventricular rate control without medications, while in atrial fibrillation.  We have discussed the fact that this means he might eventually require a dual-chamber permanent pacemaker. So far, no clinically significant bradycardia events OSA: We will schedule him for the CPAP titration portion of his sleep study. Morbid obesity: Has lost 60 lb, BMI now 40.  He remains morbidly obese and should continue his efforts.  HTN: controlled HLP: No clinical CAD or PAD, but has DM.  Target LDL less than 100.  His LDL is acceptable at 93, but he has a low HDL that will only improve with additional weight loss.  Continue  current statin dose. DM: good control, A1c 6.3%    Medication Adjustments/Labs and Tests Ordered: Current medicines are reviewed at length with the patient today.  Concerns regarding medicines are outlined above.  Orders Placed This Encounter  Procedures   EKG 12-Lead    No orders of the defined types were placed in this encounter.    Patient Instructions  Medication Instructions:  No changes *If you need a refill on your cardiac medications before your next appointment, please call your pharmacy*   Lab Work: None ordered If you have labs (blood work) drawn today and your tests are completely normal, you will receive your results only by: [place] (if you have MyChart) OR A paper copy in the mail If you have any lab test that is abnormal or we need to change your treatment, we will call you to review the results.   Testing/Procedures: None ordered   Follow-Up: At [facility], you and your health needs are our priority.  As part of our continuing mission to provide you with exceptional heart care, we have created designated Provider Care Teams.  These Care Teams include your primary Cardiologist (physician) and Advanced Practice Providers (APPs -  Physician Assistants and Nurse Practitioners) who all work together to provide you with the care you need, when you need it.  We recommend signing up for the patient portal called "MyChart".  Sign up information is provided on this After Visit Summary.  MyChart is used to connect with patients for Virtual Visits (Telemedicine).  Patients are able to view lab/test results, encounter notes, upcoming appointments, etc.  Non-urgent messages can be sent to your provider as well.   To learn more about what you can do with MyChart, go to [email or web address].    Your next appointment:   6 month(s)  The format for your next appointment:   In Person  Provider:   Sanda Klein, MD {  Important Information About  [place]         Signed, Sanda Klein, MD  02/23/2022 4:18 PM    [place]

## 2022-02-20 NOTE — Patient Instructions (Signed)
Medication Instructions:  No changes *If you need a refill on your cardiac medications before your next appointment, please call your pharmacy*   Lab Work: None ordered If you have labs (blood work) drawn today and your tests are completely normal, you will receive your results only by: MyChart Message (if you have MyChart) OR A paper copy in the mail If you have any lab test that is abnormal or we need to change your treatment, we will call you to review the results.   Testing/Procedures: None ordered   Follow-Up: At [facility], you and your health needs are our priority.  As part of our continuing mission to provide you with exceptional heart care, we have created designated Provider Care Teams.  These Care Teams include your primary Cardiologist (physician) and Advanced Practice Providers (APPs -  Physician Assistants and Nurse Practitioners) who all work together to provide you with the care you need, when you need it.  We recommend signing up for the patient portal called "MyChart".  Sign up information is provided on this After Visit Summary.  MyChart is used to connect with patients for Virtual Visits (Telemedicine).  Patients are able to view lab/test results, encounter notes, upcoming appointments, etc.  Non-urgent messages can be sent to your provider as well.   To learn more about what you can do with MyChart, go to [email or web address].    Your next appointment:   6 month(s)  The format for your next appointment:   In Person  Provider:   Mihai Croitoru, MD {    Important Information About [place]       

## 2022-03-07 ENCOUNTER — Other Ambulatory Visit: Payer: Self-pay | Admitting: Cardiovascular Disease

## 2022-03-07 ENCOUNTER — Telehealth: Payer: Self-pay | Admitting: *Deleted

## 2022-03-07 DIAGNOSIS — G4733 Obstructive sleep apnea (adult) (pediatric): Secondary | ICD-10-CM

## 2022-03-07 NOTE — Telephone Encounter (Signed)
-----   Message from Troy Sine, MD sent at 02/18/2022  8:43 AM EDT ----- Mariann Laster, see if we can schedule patient for CPAP/BiPAP titration.  His diagnostic polysomnogram was on Dec 20, 2020 and he was found to have severe sleep apnea.  At that time expeditious titration was recommended but this was never done.  If we cannot schedule him for the titration study then do split-night evaluation.

## 2022-03-07 NOTE — Telephone Encounter (Signed)
Message left sleep study order has been placed , and the sleep lab will call him with appointment. Their contact number provided to patient to schedule appointment if he does not hear back from the sleep lab within the next week.

## 2022-04-07 ENCOUNTER — Encounter (HOSPITAL_BASED_OUTPATIENT_CLINIC_OR_DEPARTMENT_OTHER): Payer: Medicare Other | Admitting: Cardiovascular Disease

## 2022-04-28 ENCOUNTER — Ambulatory Visit (HOSPITAL_BASED_OUTPATIENT_CLINIC_OR_DEPARTMENT_OTHER): Payer: Medicare Other | Attending: Cardiovascular Disease | Admitting: Cardiovascular Disease

## 2022-04-28 DIAGNOSIS — I493 Ventricular premature depolarization: Secondary | ICD-10-CM | POA: Diagnosis not present

## 2022-04-28 DIAGNOSIS — G4733 Obstructive sleep apnea (adult) (pediatric): Secondary | ICD-10-CM | POA: Insufficient documentation

## 2022-04-28 DIAGNOSIS — G4736 Sleep related hypoventilation in conditions classified elsewhere: Secondary | ICD-10-CM | POA: Insufficient documentation

## 2022-05-11 NOTE — Procedures (Signed)
Patient Name: Darius Ingram, Darius Ingram Date: 04/28/2022 Gender: Male D.O.B: 1956-01-06 Age (years): 22 Referring Provider: Malka So PA Height (inches): 72 Interpreting Physician: Shelva Majestic MD, ABSM Weight (lbs): 298 RPSGT: Laren Everts BMI: 40 MRN: [id number] Neck Size: 20.50  CLINICAL INFORMATION The patient is referred for a split night study with BPAP. Most recent polysomnogram dated 12/20/2020 revealed an AHI of 53.6/h.  MEDICATIONS acetaminophen (TYLENOL) 325 MG tablet acetaminophen (TYLENOL) 650 MG CR tablet amLODipine (NORVASC) 10 MG tablet atorvastatin (LIPITOR) 40 MG tablet calcium carbonate (TUMS EX) 750 MG chewable tablet cetirizine (ZYRTEC) 10 MG tablet Dulaglutide (TRULICITY) 1.5 VZ/5.6LO SOPN ELIQUIS 5 MG TABS tablet fluticasone (FLONASE) 50 MCG/ACT nasal spray lidocaine (LIDODERM) 5 % lisinopril (ZESTRIL) 20 MG tablet metFORMIN (GLUCOPHAGE) 1000 MG tablet (Expired) metoprolol tartrate (LOPRESSOR) 25 MG tablet Multiple Vitamin (MULTIVITAMIN PO) pantoprazole (PROTONIX) 40 MG tablet senna-docusate (SENOKOT-S) 8.6-50 MG tablet  Medications self-administered by patient taken the night of the study : N/A  SLEEP STUDY TECHNIQUE As per the AASM Manual for the Scoring of Sleep and Associated Events v2.3 (April 2016) with a hypopnea requiring 4% desaturations.  The channels recorded and monitored were frontal, central and occipital EEG, electrooculogram (EOG), submentalis EMG (chin), nasal and oral airflow, thoracic and abdominal wall motion, anterior tibialis EMG, snore microphone, electrocardiogram, and pulse oximetry. Bi-level positive airway pressure (BiPAP) was initiated when the patient met split night criteria and was titrated according to treat sleep-disordered breathing.  RESPIRATORY PARAMETERS Diagnostic Total AHI (/hr): 53.6 RDI (/hr): 54.5 OA Index (/hr): - CA Index (/hr): 0.0 REM AHI (/hr): 51.1 NREM AHI (/hr): 54.3 Supine AHI  (/hr): 53.6 Non-supine AHI (/hr): N/A Min O2 Sat (%): 56.0 Mean O2 (%): 88.0 Time below 88% (min): 63.2   Titration Optimal IPAP Pressure (cm): 25 Optimal EPAP Pressure (cm): 21 AHI at Optimal Pressure (/hr): N/A Min O2 at Optimal Pressure (%): 53.0 Sleep % at Optimal (%): N/A Supine % at Optimal (%): N/A   SLEEP ARCHITECTURE The study was initiated at 9:22:55 PM and terminated at 4:39:43 AM. The total recorded time was 436.8 minutes. EEG confirmed total sleep time was 346 minutes yielding a sleep efficiency of 79.2%%. Sleep onset after lights out was 26.9 minutes with a REM latency of 32.0 minutes. The patient spent 9.0%% of the night in stage N1 sleep, 62.7%% in stage N2 sleep, 0.0%% in stage N3 and 28.3% in REM. Wake after sleep onset (WASO) was 63.9 minutes. The Arousal Index was 20.5/hour.  LEG MOVEMENT DATA The total Periodic Limb Movements of Sleep (PLMS) were 0. The PLMS index was 0.0 .  CARDIAC DATA The 2 lead EKG demonstrated sinus rhythm. The mean heart rate was 100.0 beats per minute. Other EKG findings include: PVCs.  IMPRESSIONS - Severe obstructive sleep apnea occurred during the diagnostic portion of the study (AHI 53.6 /hour). CPAP was initiated at 5 cm, titrated to 16 cm,  transitioned to BiPAP 21/17 and titrated to maximal 25/20 cm of water. BiLevel ST was initiated with backup rate of 14 for central apneas.  (AHI at 24/20 was 6.5/h and at 25/20 was 21.0/h) - No significant central sleep apnea occurred during the diagnostic portion of the study (CAI 0.0/hour). - Severe oxygen desaturation was noted during the diagnostic portion of the study to a nadir of 56.0%. - The patient snored with soft snoring volume during the diagnostic portion of the study. - EKG findings include PVCs. - Clinically significant periodic limb movements of sleep did not  occur during the study.  DIAGNOSIS - Obstructive Sleep Apnea (G47.33) - Nocturnal Hypoxemia (G47.36)  RECOMMENDATIONS -  Recommend an initial trial of BiPAP Auto ST with EPAP min of 18, PS of 4 and IPAP max of 25 cm H2O with back-up rate of 14 with heated humidification. A Large size Fisher&Paykel Full Face Vitera mask was used for the titration. ASV titration may be necessary if central apneas persist. - Effort should be made to optimize nasal and oropharyngeal patency. - Avoid alcohol, sedatives and other CNS depressants that may worsen sleep apnea and disrupt normal sleep architecture. - Sleep hygiene should be reviewed to assess factors that may improve sleep quality. - Weight management (BMI 40) and regular exercise should be initiated or continued. - Recommend a download and sleep clinic evaluiation after 4 weeks of therapy.   [Electronically signed] 05/11/2022 05:20 PM  Shelva Majestic MD, [facility], [place], American Board of Sleep Medicine  NPI: [id number]  [place] PH: [phone]   FX: [phone] [place]

## 2022-05-21 ENCOUNTER — Telehealth: Payer: Self-pay | Admitting: *Deleted

## 2022-05-21 NOTE — Telephone Encounter (Signed)
Patient notified sleep study has been completed and order has been given for a BIPAP machine. He was also informed that since it has > 1 year since he had the NPSG study, the insurance may require that he has another baseline sleep study. Patient voiced understanding of what has been told to him. BIPAP order has been sent ot [facility] via [facility] portal.

## 2022-05-21 NOTE — Telephone Encounter (Signed)
-----   Message from Troy Sine, MD sent at 05/11/2022  5:26 PM EDT ----- Mariann Laster, please notify patient and set up with DME for BiPAP Auto ST

## 2022-08-20 ENCOUNTER — Telehealth: Payer: Self-pay | Admitting: Cardiovascular Disease

## 2022-08-20 ENCOUNTER — Encounter: Payer: Self-pay | Admitting: Cardiovascular Disease

## 2022-08-20 ENCOUNTER — Ambulatory Visit: Payer: Medicare Other | Attending: Cardiovascular Disease | Admitting: Cardiovascular Disease

## 2022-08-20 VITALS — BP 136/62 | Ht 72.0 in | Wt 284.8 lb

## 2022-08-20 DIAGNOSIS — I10 Essential (primary) hypertension: Secondary | ICD-10-CM

## 2022-08-20 DIAGNOSIS — I452 Bifascicular block: Secondary | ICD-10-CM | POA: Diagnosis not present

## 2022-08-20 DIAGNOSIS — I483 Typical atrial flutter: Secondary | ICD-10-CM | POA: Diagnosis not present

## 2022-08-20 DIAGNOSIS — E66813 Obesity, class 3: Secondary | ICD-10-CM

## 2022-08-20 DIAGNOSIS — Z6841 Body Mass Index (BMI) 40.0 and over, adult: Secondary | ICD-10-CM

## 2022-08-20 DIAGNOSIS — G4733 Obstructive sleep apnea (adult) (pediatric): Secondary | ICD-10-CM

## 2022-08-20 DIAGNOSIS — D6869 Other thrombophilia: Secondary | ICD-10-CM | POA: Diagnosis not present

## 2022-08-20 NOTE — Progress Notes (Signed)
Cardiology Office Note:    Date:  08/20/2022   ID:  Kejuan Bekker, DOB [birth year], MRN [id number]  PCP:  Horald Pollen, MD   [facility] Providers Cardiologist:  Sanda Klein, MD     Referring MD: Horald Pollen, *   Chief Complaint  Patient presents with   Irregular Heart Beat     History of Present Illness:    Darius Ingram is a 67 y.o. male with a hx of severe OSA, morbid obesity, hypertension, type 2 diabetes mellitus, hypercholesterolemia, mild left ventricular systolic dysfunction (Echo 11/14/2020 EF 45-50%), incidentally diagnosed with persistent atrial fibrillation with spontaneously controlled ventricular rate in  March 2022, status post successful cardioversion May 2022, status post intracranial hemorrhage (intraparenchymal hematoma left basal ganglia associated with small acute left frontal infarcts, unclear if hemorrhagic transformation of ischemic stroke).    He is still a resident at [facility] ever since the stroke. He is walking pretty briskly with his walker has normal speech and no swelling difficulty.  Arm strength is almost back to normal as well.  He really wants to move out of the nursing facility into assisted living.  He had a repeat sleep study with CPAP titration.  He has severe sleep apnea with an AHI of over 53 events per hour and oxygen saturation nadir down to 56%.  He is not getting BiPAP therapy as recommended.  The outpatient durable medical equipment provider [facility] cannot give him a device while he is in inpatient.  I do not understand all the details Trav[facility] is trying to describe to me, but there is some issue with getting appropriate equipment at [facility] according to him. He is on Eliquis and has not had new bleeding problems or serious injuries, although he did have 1 fall trying to get into a car.  He is not aware of the fact that he has sleep apnea, but wakes up frequently to use the bathroom.  He is not aware of his  atrial flutter.  After his episode of intracranial hemorrhage she was deemed to be an Eliquis "failure "so he was switched to Pradaxa.  At some point he has been placed back on Eliquis.  He has lost 76 pounds from his peak of 360 pounds in 2021 and he is no longer morbidly obese, although his BMI remains in severe obese range at over 38.  He has good metabolic risk factor control.  Most recent hemoglobin A1c was 6.3% and most recent LDL cholesterol was 75, but his HDL is chronically low at 29.  When he first checked in and we performed his ECG his ventricular rate was fast at 123 bpm, but 10 minutes later when I examined him at rest his heart rate was down to 90 bpm.  Background rhythm appears to be atrial flutter with an atrial cycle length around 360 bpm, mostly conducting 3: 1 with activity, 4: 1 at rest.  He has occasional PVCs.   Past Medical History:  Diagnosis Date   Allergy    seasonal allergies   Atrial fibrillation ([place])    Diabetes mellitus without complication ([place])    Hyperlipidemia    Hypertension    Hypothyroidism    Joint pain    SOBOE (shortness of breath on exertion)     Past Surgical History:  Procedure Laterality Date   CARDIOVERSION N/A 01/09/2021   Procedure: CARDIOVERSION;  Surgeon: [name], MD;  Location: [facility];  Service: Cardiovascular;  Laterality: N/A;   TONSILECTOMY, ADENOIDECTOMY, BILATERAL MYRINGOTOMY  AND TUBES  1960   TYMPANOSTOMY  1960   WISDOM TOOTH EXTRACTION  1978    Current Medications: Current Meds  Medication Sig   acetaminophen (TYLENOL) 325 MG tablet Take 2 tablets (650 mg total) by mouth every 4 (four) hours as needed for mild pain (or temp > 37.5 C (99.5 F)).   acetaminophen (TYLENOL) 650 MG CR tablet Take 325 mg by mouth daily.   amLODipine (NORVASC) 10 MG tablet Take 1 tablet (10 mg total) by mouth daily.   atorvastatin (LIPITOR) 40 MG tablet Take 1 tablet (40 mg total) by mouth daily. (Patient taking differently: Take  40 mg by mouth daily.)   calcium carbonate (TUMS EX) 750 MG chewable tablet Chew 750-1,500 mg by mouth daily as needed for heartburn.   cetirizine (ZYRTEC) 10 MG tablet Take 1 tablet (10 mg total) by mouth daily.   Dulaglutide (TRULICITY) 1.5 MH/9.6QI SOPN Inject 1.5 mg into the skin once a week.   ELIQUIS 5 MG TABS tablet Take 5 mg by mouth 2 (two) times daily.   fluticasone (FLONASE) 50 MCG/ACT nasal spray Place 2 sprays into both nostrils daily.   lidocaine (LIDODERM) 5 % Place 1 patch onto the skin daily in the afternoon. On the am and off in the pm   lisinopril (ZESTRIL) 20 MG tablet Take 1 tablet (20 mg total) by mouth daily. (Patient taking differently: Take 20 mg by mouth daily.)   metoprolol tartrate (LOPRESSOR) 25 MG tablet Take 1 tablet (25 mg total) by mouth 2 (two) times daily.   Multiple Vitamin (MULTIVITAMIN PO) Take 1 tablet by mouth daily.   pantoprazole (PROTONIX) 40 MG tablet Take 1 tablet (40 mg total) by mouth at bedtime.   senna-docusate (SENOKOT-S) 8.6-50 MG tablet Take 1 tablet by mouth 2 (two) times daily.     Allergies:   Penicillins   Social History   Socioeconomic History   Marital status: Single    Spouse name: Not on file   Number of children: Not on file   Years of education: Not on file   Highest education level: Not on file  Occupational History   Occupation: Retired Biomedical scientist  Tobacco Use   Smoking status: Former    Packs/day: 1.00    Years: 10.00    Total pack years: 10.00    Types: Cigarettes    Quit date: 12/29/1998    Years since quitting: 23.6   Smokeless tobacco: Never  Vaping Use   Vaping Use: Never used  Substance and Sexual Activity   Alcohol use: Yes    Alcohol/week: 1.0 standard drink of alcohol    Types: 1 Cans of beer per week    Comment: occ   Drug use: No   Sexual activity: Never  Other Topics Concern   Not on file  Social History Narrative   Not on file   Social Determinants of Health   Financial Resource Strain: Not on  file  Food Insecurity: Not on file  Transportation Needs: Not on file  Physical Activity: Not on file  Stress: Not on file  Social Connections: Not on file     Family History: The patient's family history includes Diabetes in his father and mother; Heart disease in his father; Hypertension in his father; Obesity in his father and mother. There is no history of Colon polyps, Colon cancer, Esophageal cancer, Stomach cancer, or Rectal cancer.  ROS:   Please see the history of present illness.     All other systems  reviewed and are negative.  EKGs/Labs/Other Studies Reviewed:    The following studies were reviewed today: Echocardiogram 11/14/2020  1. Abnormal septal motion inferior basal hypokinesis Abnormal GLS -13.3 . Left ventricular ejection fraction, by estimation, is 45 to 50%. The left ventricle has mildly decreased function. The left ventricle has no regional wall motion abnormalities. There is severe left ventricular hypertrophy. Left ventricular diastolic  parameters are indeterminate.   2. Right ventricular systolic function is normal. The right ventricular size is normal.   3. Left atrial size was moderately dilated.   4. The mitral valve is normal in structure. Trivial mitral valve  regurgitation. No evidence of mitral stenosis.   5. The aortic valve is normal in structure. Aortic valve regurgitation is not visualized. No aortic stenosis is present.   6. Aortic dilatation noted. There is mild dilatation of the ascending  aorta, measuring 38 mm.   7. The inferior vena cava is normal in size with greater than 50%  respiratory variability, suggesting right atrial pressure of 3 mmHg.    EKG:  EKG is ordered today and personally reviewed.  It shows atypical atrial flutter with probably 2:1 AV block and occasional PVC, RBBB+LAFB   Echo 07/25/2021   1. Basal to mid inferior hypokinesis. Left ventricular ejection fraction, by estimation, is 55 to 60%. The left ventricle has  normal function. The left ventricle demonstrates regional wall motion abnormalities (see scoring diagram/findings for  description). There is moderate concentric left ventricular hypertrophy. Left ventricular diastolic parameters are consistent with Grade I diastolic dysfunction (impaired relaxation).   2. Right ventricular systolic function is normal. The right ventricular size is normal. There is normal pulmonary artery systolic pressure.   3. Left atrial size was mildly dilated.   4. The mitral valve is normal in structure. No evidence of mitral valve regurgitation. No evidence of mitral stenosis.   5. The aortic valve is tricuspid. Aortic valve regurgitation is not visualized. No aortic stenosis is present.   6. Aortic dilatation noted. There is mild dilatation of the ascending aorta, measuring 40 mm.   7. The inferior vena cava is normal in size with greater than 50% respiratory variability, suggesting right atrial pressure of 3 mmHg.   Recent Labs: No results found for requested labs within last 365 days.  Recent Lipid Panel    Component Value Date/Time   CHOL 123 07/25/2021 1030   CHOL 152 10/02/2020 1039   TRIG 96 07/25/2021 1030   HDL 29 (L) 07/25/2021 1030   HDL 37 (L) 10/02/2020 1039   CHOLHDL 4.2 07/25/2021 1030   VLDL 19 07/25/2021 1030   LDLCALC 75 07/25/2021 1030   LDLCALC 93 10/02/2020 1039     Risk Assessment/Calculations:    CHA2DS2-VASc Score =  6 (age, HTN, DM, CHF, CVA)  Physical Exam:    VS:  BP 136/62   Ht 6' (1.829 m)   Wt 284 lb 12.8 oz (129.2 kg)   SpO2 98%   BMI 38.63 kg/m     Wt Readings from Last 3 Encounters:  08/20/22 284 lb 12.8 oz (129.2 kg)  04/28/22 298 lb (135.2 kg)  02/20/22 298 lb 6.4 oz (135.4 kg)      General: Alert, oriented x3, no distress, severely obese Head: no evidence of trauma, PERRL, EOMI, no exophtalmos or lid lag, no myxedema, no xanthelasma; normal ears, nose and oropharynx Neck: normal jugular venous pulsations and  no hepatojugular reflux; brisk carotid pulses without delay and no carotid bruits Chest: clear  to auscultation, no signs of consolidation by percussion or palpation, normal fremitus, symmetrical and full respiratory excursions Cardiovascular: normal position and quality of the apical impulse, regular rhythm with occasional ectopy, normal first and second heart sounds, no murmurs, rubs or gallops Abdomen: no tenderness or distention, no masses by palpation, no abnormal pulsatility or arterial bruits, normal bowel sounds, no hepatosplenomegaly Extremities: no clubbing, cyanosis or edema, other than trace ankle swelling on the right side; 2+ radial, ulnar and brachial pulses bilaterally; 2+ right femoral, posterior tibial and dorsalis pedis pulses; 2+ left femoral, posterior tibial and dorsalis pedis pulses; no subclavian or femoral bruits Neurological: grossly nonfocal.  I do not think there is a perceptible difference in upper extremity strength any longer.  Walks fairly briskly with his walker. Psych: Normal mood and affect   ASSESSMENT:    1. Typical atrial flutter ([place])   2. Acquired thrombophilia ([place])   3. Bifascicular block   4. OSA (obstructive sleep apnea)   5. Class 3 severe obesity due to excess calories with serious comorbidity and body mass index (BMI) of 45.0 to 49.9 in adult ([place])   6. Essential hypertension       PLAN:    In order of problems listed above:  AFib/flutter: He is in persistent atrial flutter.  We previously successfully performed a cardioversion but after a few wounds he went back into atrial flutter.  Until he is consistently treated for his obstructive sleep apnea I do not think cardioversion makes sense.  He has satisfactory ventricular rate control on the current low-dose of beta-blocker, likely because he also has underlying conduction system disease. Anticoagulation: No active bleeding issues and no recent injuries.  Intraparenchymal hemorrhage on DOAC may  have been due to hemorrhagic transformation of ischemic stroke. Back on same DOAC. Bifascicular block: Has not had evidence of high-grade AV block.  Significant AV conduction abnormalities are also confirmed by the fact that he had spontaneous ventricular rate control without medications, while in atrial fibrillation.  We have discussed the fact that this means he might eventually require a dual-chamber permanent pacemaker. So far, no clinically significant bradycardia events OSA: Despite having undergone the BiPAP titration portion of his sleep apnea evaluation in October 2023, he is still not receiving treatment with BiPAP.  I am not sure exactly what the hold-up is, but will ask [facility] to make sure he receives BiPAP equipment. Severe obesity: He has lost almost 80 pounds.  He is now no longer morbidly obese, but needs to continue his efforts at weight loss. HTN: Controlled.  If he loses substantial more weight we will likely be able to cut back on his other antihypertensives.  Should continue metoprolol for rate control unless he develops severe bradycardia. HLP: Needs to get updated lipid profile and hemoglobin A1c.  No clinical CAD or PAD, but has DM.  Target LDL less than 100.  His LDL is acceptable at 93, but he has a low HDL that will only improve with additional weight loss.  Continue current statin dose. DM: good control, A1c 6.3%    Medication Adjustments/Labs and Tests Ordered: Current medicines are reviewed at length with the patient today.  Concerns regarding medicines are outlined above.  No orders of the defined types were placed in this encounter.   No orders of the defined types were placed in this encounter.    Patient Instructions  Medication Instructions:  Your physician recommends that you continue on your current medications as directed. Please refer to  the Current Medication list given to you today.  *If you need a refill on your cardiac medications before your  next appointment, please call your pharmacy*  Follow-Up: At [facility], you and your health needs are our priority.  As part of our continuing mission to provide you with exceptional heart care, we have created designated Provider Care Teams.  These Care Teams include your primary Cardiologist (physician) and Advanced Practice Providers (APPs -  Physician Assistants and Nurse Practitioners) who all work together to provide you with the care you need, when you need it.  We recommend signing up for the patient portal called "MyChart".  Sign up information is provided on this After Visit Summary.  MyChart is used to connect with patients for Virtual Visits (Telemedicine).  Patients are able to view lab/test results, encounter notes, upcoming appointments, etc.  Non-urgent messages can be sent to your provider as well.   To learn more about what you can do with MyChart, go to [email or web address].    Your next appointment:   6 month(s)  Provider:   Sanda Klein, MD         Signed, Sanda Klein, MD  08/20/2022 10:58 AM    [place]

## 2022-08-20 NOTE — Telephone Encounter (Signed)
Calling to see what setting his bypath suppose to be on. Please advise

## 2022-08-20 NOTE — Patient Instructions (Signed)
Medication Instructions:  Your physician recommends that you continue on your current medications as directed. Please refer to the Current Medication list given to you today.  *If you need a refill on your cardiac medications before your next appointment, please call your pharmacy*  Follow-Up: At [facility], you and your health needs are our priority.  As part of our continuing mission to provide you with exceptional heart care, we have created designated Provider Care Teams.  These Care Teams include your primary Cardiologist (physician) and Advanced Practice Providers (APPs -  Physician Assistants and Nurse Practitioners) who all work together to provide you with the care you need, when you need it.  We recommend signing up for the patient portal called "MyChart".  Sign up information is provided on this After Visit Summary.  MyChart is used to connect with patients for Virtual Visits (Telemedicine).  Patients are able to view lab/test results, encounter notes, upcoming appointments, etc.  Non-urgent messages can be sent to your provider as well.   To learn more about what you can do with MyChart, go to [email or web address].    Your next appointment:   6 month(s)  Provider:   Sanda Klein, MD

## 2022-08-20 NOTE — Telephone Encounter (Signed)
Returned a call to [employer]. She was given the pressure setting information that Dr Claiborne Billings dictated in the titration report. It was emphasized to her that the order states the device should be a BIPAP ST. Not a plain BIPAP device. She was also informed the patient may have to redo a baseline sleep study due to the NPSG being > 68 year old.

## 2022-08-22 NOTE — Addendum Note (Signed)
Addended by: Hinton Dyer on: 08/22/2022 10:41 AM   Modules accepted: Orders

## 2022-12-24 ENCOUNTER — Encounter: Payer: Self-pay | Admitting: Gastroenterology

## 2023-08-08 IMAGING — CT CT HEAD W/O CM
3 series · 16 of 37 positions shown, 18 images · non-contrast
Comparison: 07/25/2021

CLINICAL DATA: Stroke suspected

EXAM:
CT HEAD WITHOUT CONTRAST
TECHNIQUE: Contiguous axial images were obtained from the base of the skull
through the vertex without intravenous contrast.

[Series 3: head wo · axial · 0.46mm/px · z∈[-502,-372]mm · 7 of 36 slices shown, 9 images]
[im 5/36  brain]
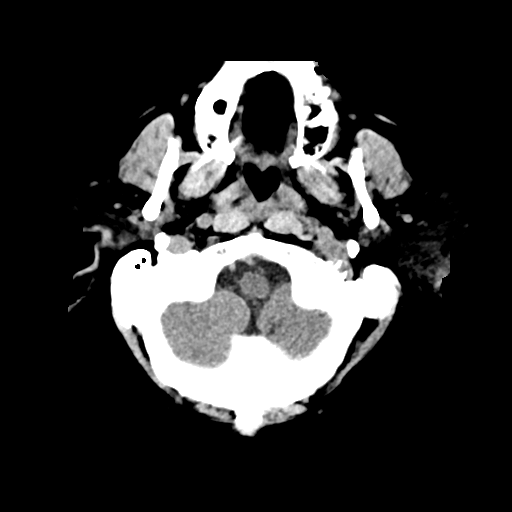
[im 5/36  bone]
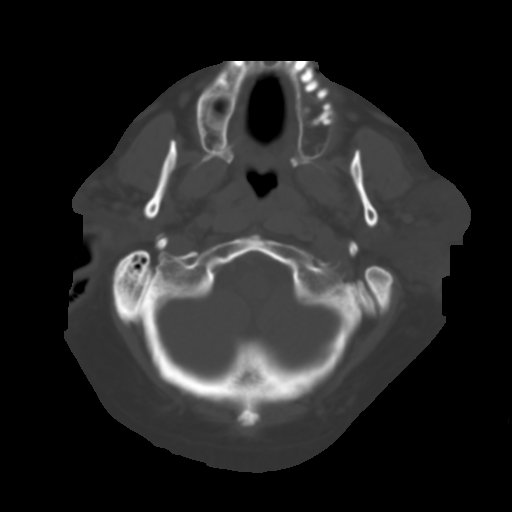
[im 9/36  brain]
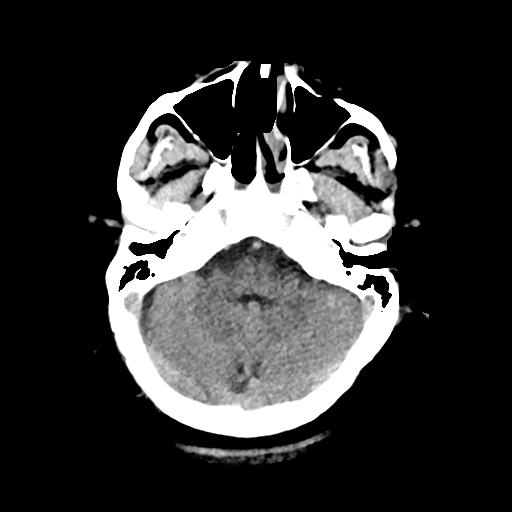
[im 14/36  brain]
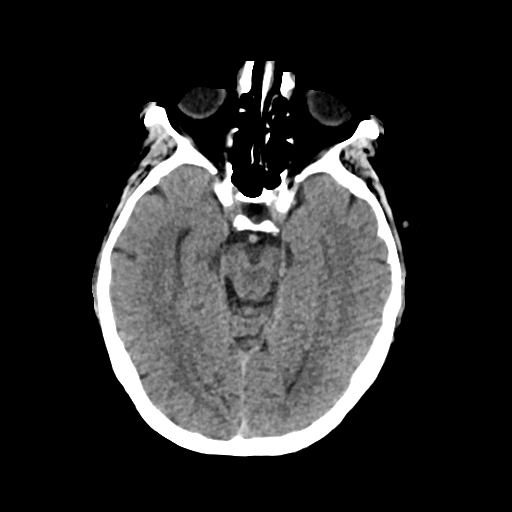
[im 18/36  brain]
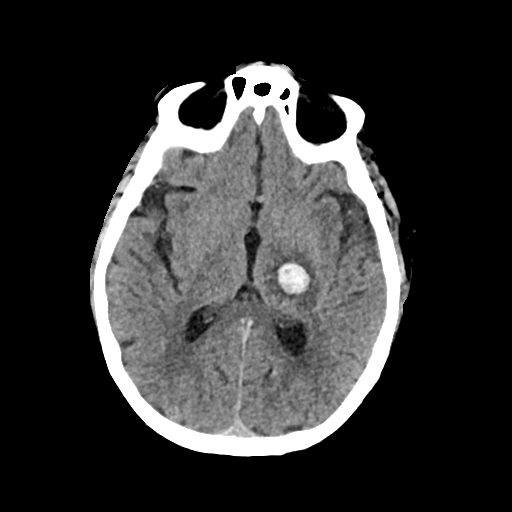
[im 22/36  brain]
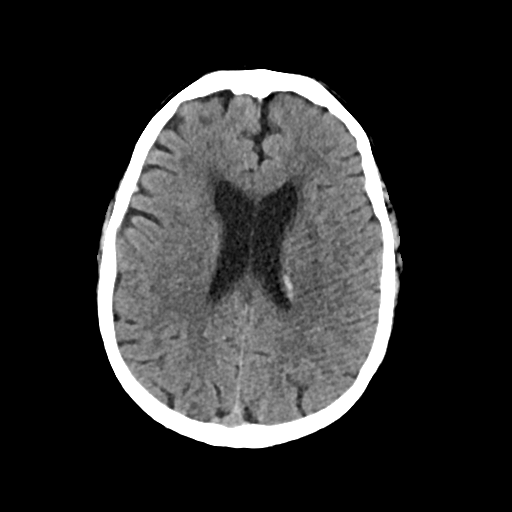
[im 22/36  bone]
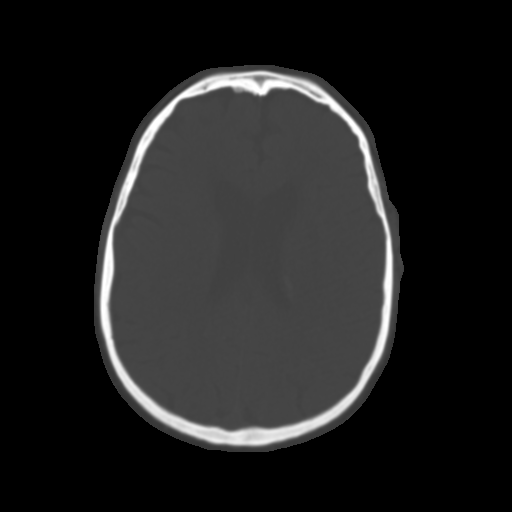
[im 27/36  brain]
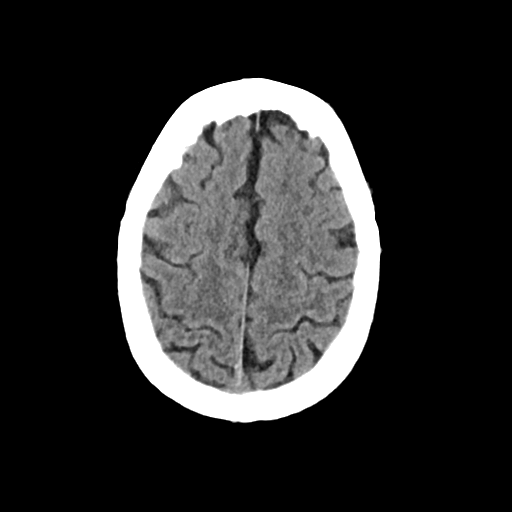
[im 31/36  brain]
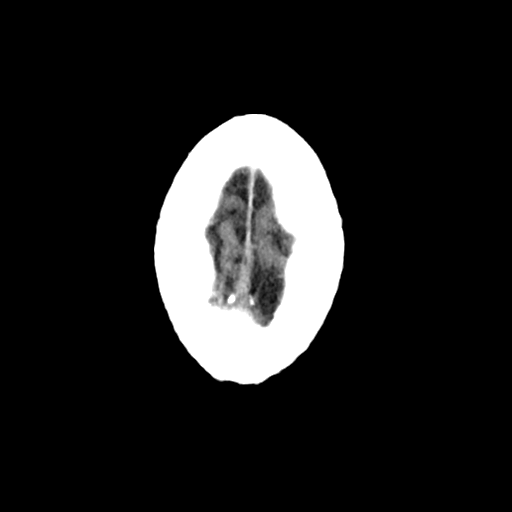

[Series 4: head bone · axial · 0.46mm/px · z∈[-506,-400]mm · 6 of 89 slices shown]
[im 9/89  bone]
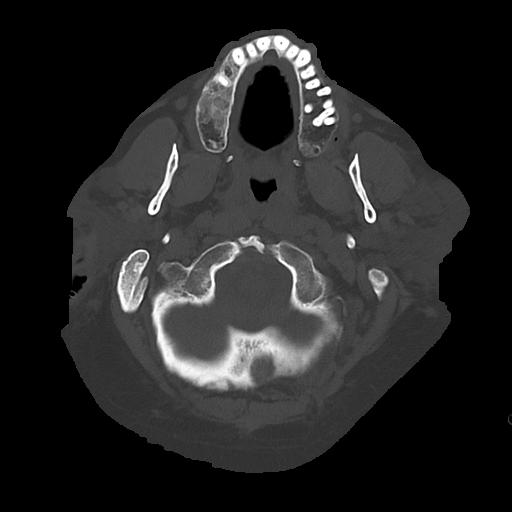
[im 18/89  bone]
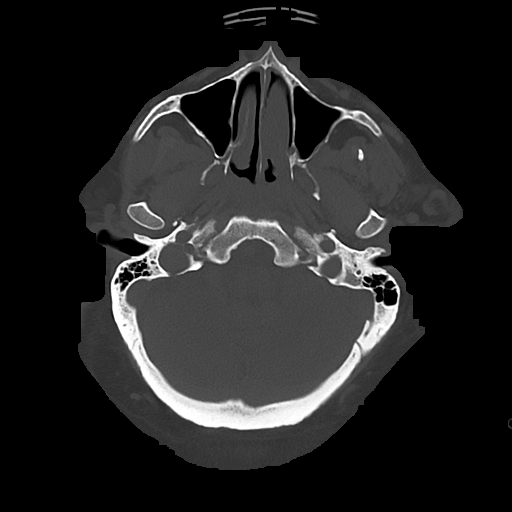
[im 27/89  bone]
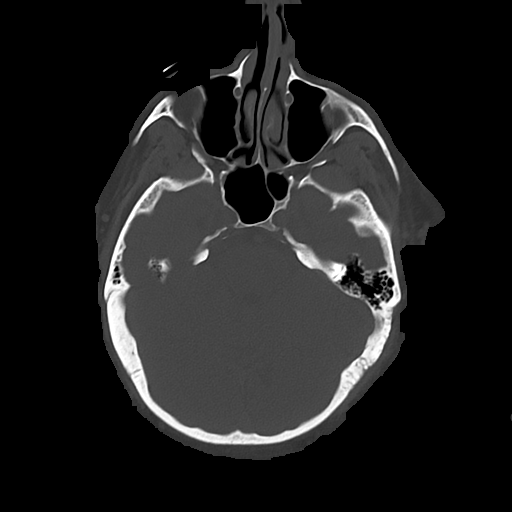
[im 40/89  bone]
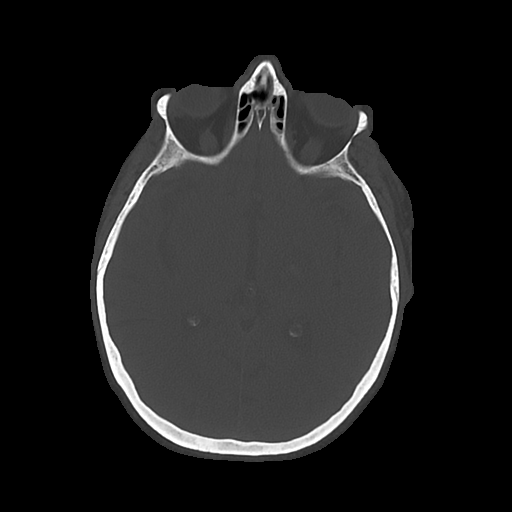
[im 49/89  bone]
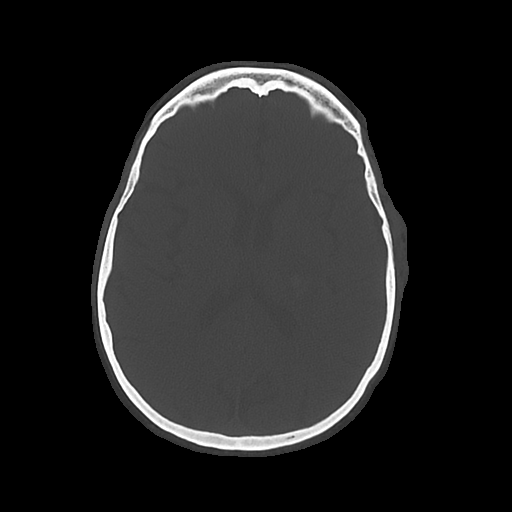
[im 62/89  bone]
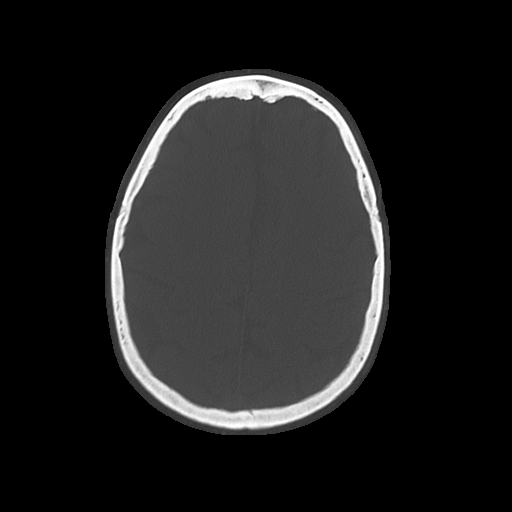

[Series 6: sag soft · sagittal · 0.33mm/px · 3 of 73 slices shown]
[im 25/73  brain]
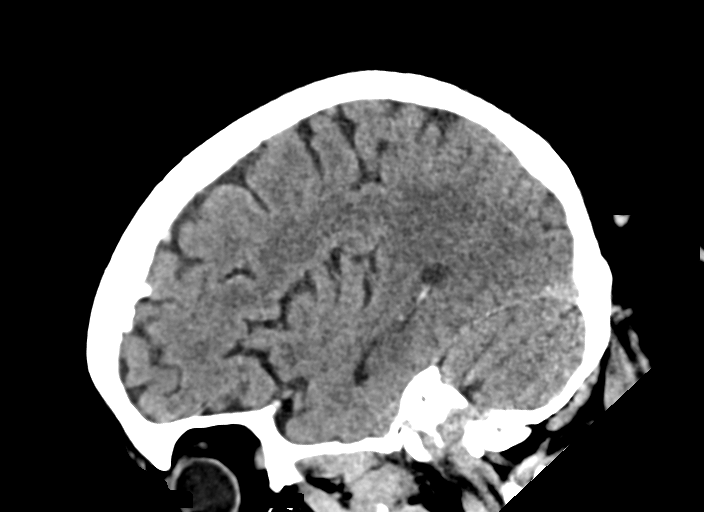
[im 37/73  brain]
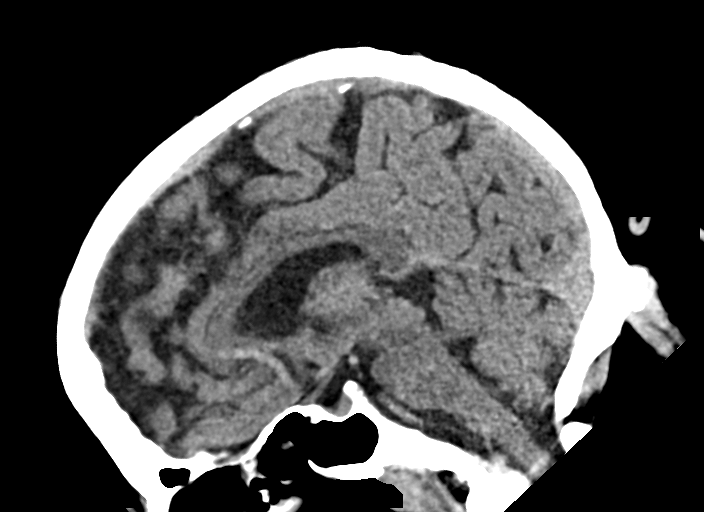
[im 49/73  brain]
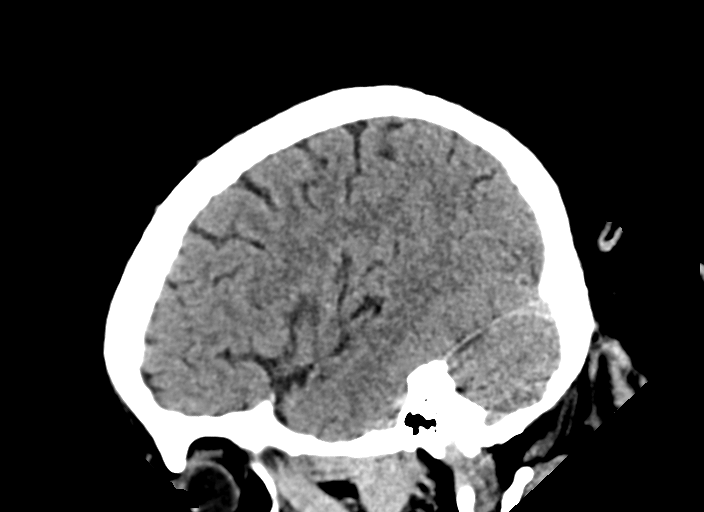

[16 of 37 positions shown; findings below may reference images not displayed]

FINDINGS: Brain: Unchanged hemorrhage in the left internal capsule and caudate
body, internal capsule component measuring up to 22 mm craniocaudal
on coronal reformats. No worrisome mass effect or new site of
hemorrhage. Underestimated left frontal infarcts when compared to
MRI.

Vascular: No hyperdense vessel or unexpected calcification.

Skull: Normal. Negative for fracture or focal lesion.

Sinuses/Orbits: No acute finding.
IMPRESSION: Stable infarcts and hemorrhage in the left cerebral hemisphere. No
new or progressive finding.
